# Patient Record
Sex: Female | Born: 1935 | Race: White | Hispanic: No | Smoking: Former smoker
Health system: Southern US, Community
[De-identification: ages and names within clinical notes are randomized; demographics above are authoritative.]

## PROBLEM LIST (undated history)

## (undated) DIAGNOSIS — R55 Syncope and collapse: Secondary | ICD-10-CM

## (undated) DIAGNOSIS — N3090 Cystitis, unspecified without hematuria: Secondary | ICD-10-CM

## (undated) DIAGNOSIS — K219 Gastro-esophageal reflux disease without esophagitis: Secondary | ICD-10-CM

## (undated) DIAGNOSIS — R920 Mammographic microcalcification found on diagnostic imaging of breast: Secondary | ICD-10-CM

## (undated) DIAGNOSIS — D241 Benign neoplasm of right breast: Secondary | ICD-10-CM

## (undated) DIAGNOSIS — K639 Disease of intestine, unspecified: Secondary | ICD-10-CM

## (undated) DIAGNOSIS — M1990 Unspecified osteoarthritis, unspecified site: Secondary | ICD-10-CM

## (undated) DIAGNOSIS — C801 Malignant (primary) neoplasm, unspecified: Secondary | ICD-10-CM

## (undated) DIAGNOSIS — Z974 Presence of external hearing-aid: Secondary | ICD-10-CM

## (undated) DIAGNOSIS — E785 Hyperlipidemia, unspecified: Secondary | ICD-10-CM

## (undated) DIAGNOSIS — T7840XA Allergy, unspecified, initial encounter: Secondary | ICD-10-CM

## (undated) DIAGNOSIS — C50419 Malignant neoplasm of upper-outer quadrant of unspecified female breast: Secondary | ICD-10-CM

## (undated) DIAGNOSIS — K589 Irritable bowel syndrome without diarrhea: Secondary | ICD-10-CM

## (undated) HISTORY — DX: Hyperlipidemia, unspecified: E78.5

## (undated) HISTORY — DX: Unspecified osteoarthritis, unspecified site: M19.90

## (undated) HISTORY — DX: Cystitis, unspecified without hematuria: N30.90

## (undated) HISTORY — DX: Malignant (primary) neoplasm, unspecified: C80.1

## (undated) HISTORY — DX: Irritable bowel syndrome, unspecified: K58.9

## (undated) HISTORY — DX: Mammographic microcalcification found on diagnostic imaging of breast: R92.0

## (undated) HISTORY — DX: Malignant neoplasm of upper-outer quadrant of unspecified female breast: C50.419

## (undated) HISTORY — DX: Presence of external hearing-aid: Z97.4

## (undated) HISTORY — DX: Benign neoplasm of right breast: D24.1

## (undated) HISTORY — DX: Gastro-esophageal reflux disease without esophagitis: K21.9

## (undated) HISTORY — DX: Disease of intestine, unspecified: K63.9

## (undated) HISTORY — DX: Syncope and collapse: R55

## (undated) HISTORY — DX: Allergy, unspecified, initial encounter: T78.40XA

---

## 1963-02-08 HISTORY — PX: OTHER SURGICAL HISTORY: SHX169

## 1965-02-07 HISTORY — PX: OVARIAN CYST REMOVAL: SHX89

## 1968-02-08 DIAGNOSIS — K639 Disease of intestine, unspecified: Secondary | ICD-10-CM

## 1968-02-08 HISTORY — DX: Disease of intestine, unspecified: K63.9

## 1978-02-07 DIAGNOSIS — M1990 Unspecified osteoarthritis, unspecified site: Secondary | ICD-10-CM

## 1978-02-07 HISTORY — DX: Unspecified osteoarthritis, unspecified site: M19.90

## 2004-02-08 HISTORY — PX: POLYPECTOMY: SHX149

## 2004-02-08 HISTORY — PX: THYROIDECTOMY: SHX17

## 2004-03-14 ENCOUNTER — Observation Stay (HOSPITAL_COMMUNITY): Admission: EM | Admit: 2004-03-14 | Discharge: 2004-03-15 | Payer: Self-pay | Admitting: Emergency Medicine

## 2004-05-25 ENCOUNTER — Ambulatory Visit: Payer: Self-pay | Admitting: General Surgery

## 2004-07-15 ENCOUNTER — Ambulatory Visit: Payer: Self-pay | Admitting: Family Medicine

## 2004-07-22 ENCOUNTER — Ambulatory Visit: Payer: Self-pay | Admitting: Family Medicine

## 2004-10-26 ENCOUNTER — Ambulatory Visit: Payer: Self-pay | Admitting: Otolaryngology

## 2005-01-14 ENCOUNTER — Ambulatory Visit: Payer: Self-pay | Admitting: Otolaryngology

## 2005-10-12 ENCOUNTER — Ambulatory Visit: Payer: Self-pay | Admitting: Family Medicine

## 2006-02-07 DIAGNOSIS — N3090 Cystitis, unspecified without hematuria: Secondary | ICD-10-CM

## 2006-02-07 HISTORY — DX: Cystitis, unspecified without hematuria: N30.90

## 2007-02-08 HISTORY — PX: OTHER SURGICAL HISTORY: SHX169

## 2007-02-08 HISTORY — PX: TONGUE SURGERY: SHX810

## 2007-09-24 ENCOUNTER — Ambulatory Visit: Payer: Self-pay

## 2007-09-24 ENCOUNTER — Other Ambulatory Visit: Payer: Self-pay

## 2008-04-30 ENCOUNTER — Ambulatory Visit: Payer: Self-pay | Admitting: Family Medicine

## 2009-02-07 HISTORY — PX: COLONOSCOPY: SHX174

## 2009-06-09 ENCOUNTER — Ambulatory Visit: Payer: Self-pay | Admitting: General Surgery

## 2009-06-18 ENCOUNTER — Ambulatory Visit: Payer: Self-pay | Admitting: Family Medicine

## 2009-06-24 ENCOUNTER — Ambulatory Visit: Payer: Self-pay | Admitting: Family Medicine

## 2010-09-22 ENCOUNTER — Ambulatory Visit: Payer: Self-pay | Admitting: Family Medicine

## 2010-10-14 ENCOUNTER — Ambulatory Visit: Payer: Self-pay | Admitting: Family Medicine

## 2010-11-08 HISTORY — PX: BREAST SURGERY: SHX581

## 2010-11-21 DIAGNOSIS — R928 Other abnormal and inconclusive findings on diagnostic imaging of breast: Secondary | ICD-10-CM | POA: Insufficient documentation

## 2010-11-22 ENCOUNTER — Ambulatory Visit: Payer: Self-pay | Admitting: General Surgery

## 2010-11-24 LAB — PATHOLOGY REPORT

## 2011-04-07 DIAGNOSIS — Z961 Presence of intraocular lens: Secondary | ICD-10-CM | POA: Diagnosis not present

## 2011-04-07 DIAGNOSIS — H2510 Age-related nuclear cataract, unspecified eye: Secondary | ICD-10-CM | POA: Diagnosis not present

## 2011-04-29 DIAGNOSIS — E039 Hypothyroidism, unspecified: Secondary | ICD-10-CM | POA: Diagnosis not present

## 2011-05-05 DIAGNOSIS — H6120 Impacted cerumen, unspecified ear: Secondary | ICD-10-CM | POA: Diagnosis not present

## 2011-05-05 DIAGNOSIS — H903 Sensorineural hearing loss, bilateral: Secondary | ICD-10-CM | POA: Diagnosis not present

## 2011-05-05 DIAGNOSIS — E890 Postprocedural hypothyroidism: Secondary | ICD-10-CM | POA: Diagnosis not present

## 2011-05-26 ENCOUNTER — Ambulatory Visit: Payer: Self-pay | Admitting: General Surgery

## 2011-05-26 DIAGNOSIS — R928 Other abnormal and inconclusive findings on diagnostic imaging of breast: Secondary | ICD-10-CM | POA: Diagnosis not present

## 2011-05-26 DIAGNOSIS — R920 Mammographic microcalcification found on diagnostic imaging of breast: Secondary | ICD-10-CM | POA: Diagnosis not present

## 2011-06-06 DIAGNOSIS — R920 Mammographic microcalcification found on diagnostic imaging of breast: Secondary | ICD-10-CM | POA: Diagnosis not present

## 2011-07-07 ENCOUNTER — Ambulatory Visit: Payer: Self-pay | Admitting: Gastroenterology

## 2011-07-07 DIAGNOSIS — Z79899 Other long term (current) drug therapy: Secondary | ICD-10-CM | POA: Diagnosis not present

## 2011-07-07 DIAGNOSIS — E890 Postprocedural hypothyroidism: Secondary | ICD-10-CM | POA: Diagnosis not present

## 2011-07-07 DIAGNOSIS — T18108A Unspecified foreign body in esophagus causing other injury, initial encounter: Secondary | ICD-10-CM | POA: Diagnosis not present

## 2011-07-07 DIAGNOSIS — R1310 Dysphagia, unspecified: Secondary | ICD-10-CM | POA: Diagnosis not present

## 2011-07-07 DIAGNOSIS — K2100 Gastro-esophageal reflux disease with esophagitis, without bleeding: Secondary | ICD-10-CM | POA: Diagnosis not present

## 2011-07-07 DIAGNOSIS — R6889 Other general symptoms and signs: Secondary | ICD-10-CM | POA: Diagnosis not present

## 2011-07-07 LAB — COMPREHENSIVE METABOLIC PANEL
Anion Gap: 6 — ABNORMAL LOW (ref 7–16)
BUN: 16 mg/dL (ref 7–18)
Chloride: 107 mmol/L (ref 98–107)
Co2: 29 mmol/L (ref 21–32)
Creatinine: 0.95 mg/dL (ref 0.60–1.30)
EGFR (African American): >60
EGFR (Non-African Amer.): 59 — ABNORMAL LOW
Glucose: 104 mg/dL — ABNORMAL HIGH (ref 65–99)
Potassium: 3.7 mmol/L (ref 3.5–5.1)
SGOT(AST): 21 U/L (ref 15–37)
Total Protein: 8.3 g/dL — ABNORMAL HIGH (ref 6.4–8.2)

## 2011-07-07 LAB — CBC
HCT: 44.1 % (ref 35.0–47.0)
HGB: 14.4 g/dL (ref 12.0–16.0)
MCHC: 32.5 g/dL (ref 32.0–36.0)
MCV: 93 fL (ref 80–100)
Platelet: 304 10*3/uL (ref 150–440)
RBC: 4.76 10*6/uL (ref 3.80–5.20)

## 2011-07-08 DIAGNOSIS — T18108A Unspecified foreign body in esophagus causing other injury, initial encounter: Secondary | ICD-10-CM | POA: Diagnosis not present

## 2011-07-08 DIAGNOSIS — R1310 Dysphagia, unspecified: Secondary | ICD-10-CM | POA: Diagnosis not present

## 2011-07-08 DIAGNOSIS — K2100 Gastro-esophageal reflux disease with esophagitis, without bleeding: Secondary | ICD-10-CM | POA: Diagnosis not present

## 2011-08-02 DIAGNOSIS — E039 Hypothyroidism, unspecified: Secondary | ICD-10-CM | POA: Diagnosis not present

## 2011-08-05 DIAGNOSIS — H903 Sensorineural hearing loss, bilateral: Secondary | ICD-10-CM | POA: Diagnosis not present

## 2011-08-05 DIAGNOSIS — E890 Postprocedural hypothyroidism: Secondary | ICD-10-CM | POA: Diagnosis not present

## 2011-08-05 DIAGNOSIS — H6120 Impacted cerumen, unspecified ear: Secondary | ICD-10-CM | POA: Diagnosis not present

## 2011-11-23 DIAGNOSIS — Z23 Encounter for immunization: Secondary | ICD-10-CM | POA: Diagnosis not present

## 2011-11-28 ENCOUNTER — Ambulatory Visit: Payer: Self-pay | Admitting: General Surgery

## 2011-11-28 DIAGNOSIS — R928 Other abnormal and inconclusive findings on diagnostic imaging of breast: Secondary | ICD-10-CM | POA: Diagnosis not present

## 2011-12-01 ENCOUNTER — Ambulatory Visit: Payer: Self-pay | Admitting: General Surgery

## 2011-12-01 DIAGNOSIS — R928 Other abnormal and inconclusive findings on diagnostic imaging of breast: Secondary | ICD-10-CM | POA: Diagnosis not present

## 2011-12-06 DIAGNOSIS — R920 Mammographic microcalcification found on diagnostic imaging of breast: Secondary | ICD-10-CM | POA: Diagnosis not present

## 2012-02-08 DIAGNOSIS — C801 Malignant (primary) neoplasm, unspecified: Secondary | ICD-10-CM

## 2012-02-08 HISTORY — PX: BREAST LUMPECTOMY: SHX2

## 2012-02-08 HISTORY — PX: BREAST EXCISIONAL BIOPSY: SUR124

## 2012-02-08 HISTORY — PX: BREAST BIOPSY: SHX20

## 2012-02-08 HISTORY — DX: Malignant (primary) neoplasm, unspecified: C80.1

## 2012-04-07 ENCOUNTER — Encounter: Payer: Self-pay | Admitting: *Deleted

## 2012-05-24 ENCOUNTER — Ambulatory Visit: Payer: Self-pay | Admitting: Otolaryngology

## 2012-05-24 DIAGNOSIS — H6120 Impacted cerumen, unspecified ear: Secondary | ICD-10-CM | POA: Diagnosis not present

## 2012-05-24 DIAGNOSIS — E039 Hypothyroidism, unspecified: Secondary | ICD-10-CM | POA: Diagnosis not present

## 2012-05-24 DIAGNOSIS — H903 Sensorineural hearing loss, bilateral: Secondary | ICD-10-CM | POA: Diagnosis not present

## 2012-05-24 DIAGNOSIS — E890 Postprocedural hypothyroidism: Secondary | ICD-10-CM | POA: Diagnosis not present

## 2012-05-24 LAB — TSH: Thyroid Stimulating Horm: 0.987 u[IU]/mL

## 2012-05-25 DIAGNOSIS — M7150 Other bursitis, not elsewhere classified, unspecified site: Secondary | ICD-10-CM | POA: Diagnosis not present

## 2012-05-25 DIAGNOSIS — M25569 Pain in unspecified knee: Secondary | ICD-10-CM | POA: Diagnosis not present

## 2012-06-04 ENCOUNTER — Ambulatory Visit: Payer: Self-pay | Admitting: General Surgery

## 2012-06-04 DIAGNOSIS — R928 Other abnormal and inconclusive findings on diagnostic imaging of breast: Secondary | ICD-10-CM | POA: Diagnosis not present

## 2012-06-04 DIAGNOSIS — R920 Mammographic microcalcification found on diagnostic imaging of breast: Secondary | ICD-10-CM | POA: Diagnosis not present

## 2012-06-06 ENCOUNTER — Encounter: Payer: Self-pay | Admitting: General Surgery

## 2012-06-18 DIAGNOSIS — M25569 Pain in unspecified knee: Secondary | ICD-10-CM | POA: Diagnosis not present

## 2012-06-18 DIAGNOSIS — J0100 Acute maxillary sinusitis, unspecified: Secondary | ICD-10-CM | POA: Diagnosis not present

## 2012-06-19 ENCOUNTER — Other Ambulatory Visit: Payer: Self-pay | Admitting: *Deleted

## 2012-06-19 ENCOUNTER — Ambulatory Visit (INDEPENDENT_AMBULATORY_CARE_PROVIDER_SITE_OTHER): Payer: Medicare Other | Admitting: General Surgery

## 2012-06-19 ENCOUNTER — Encounter: Payer: Self-pay | Admitting: General Surgery

## 2012-06-19 VITALS — BP 152/80 | HR 72 | Resp 12 | Ht 68.0 in | Wt 202.0 lb

## 2012-06-19 DIAGNOSIS — N63 Unspecified lump in unspecified breast: Secondary | ICD-10-CM | POA: Diagnosis not present

## 2012-06-19 DIAGNOSIS — R928 Other abnormal and inconclusive findings on diagnostic imaging of breast: Secondary | ICD-10-CM

## 2012-06-19 NOTE — Progress Notes (Signed)
Patient to have a bilateral diagnostic mammogram follow up in October 2014.  

## 2012-06-19 NOTE — Progress Notes (Addendum)
Patient ID: Eileen Flores, female   DOB: 01-12-1936, 77 y.o.   MRN: [id number]  Chief Complaint  Patient presents with  . Follow-up    HPI Eileen Flores is a 77 y.o. female.  Patient here today for 6 month follow up right mammogram, area of density.  She has known history of left breast stereo biopsy benign in Oct 2012.  She denies any breast issues, and no family history of breast cancer.  She does states she has a little "sinus congestion" and is on doxycylcine. HPI  Past Medical History  Diagnosis Date  . Cystitis 2008  . Arthritis 1980  . Mammographic microcalcification   . Bowel trouble 1970    Past Surgical History  Procedure Laterality Date  . Ovarian cyst removal  1967  . Dermbrasion   1965  . Colonoscopy  2011    Dr. Lemar Livings  . Cataract surgery Bilateral 2009  . Polypectomy  2006  . Thyroidectomy  2006  . Tongue surgery  2009  . Breast surgery Left Oct 2012    benign stereo biopsy    Family History  Problem Relation Age of Onset  . Colon cancer    . Colon polyps      Social History History  Substance Use Topics  . Smoking status: Never Smoker   . Smokeless tobacco: Never Used  . Alcohol Use: No    Allergies  Allergen Reactions  . Penicillins Rash    Current Outpatient Prescriptions  Medication Sig Dispense Refill  . Calcium Carb-Cholecalciferol (CALCIUM + D3) 600-200 MG-UNIT TABS Take 1 tablet by mouth daily.      . calcium citrate-vitamin D (CITRACAL+D) 315-200 MG-UNIT per tablet Take 1 tablet by mouth 2 (two) times daily.      [name] doxycycline (DORYX) 100 MG DR capsule Take 100 mg by mouth 2 (two) times daily.      [name] levothyroxine (SYNTHROID, LEVOTHROID) 100 MCG tablet Take 100 mcg by mouth daily before breakfast.      . meloxicam (MOBIC) 7.5 MG tablet Take 7.5 mg by mouth daily.      . Multiple Vitamins-Minerals (MULTIVITAMIN WITH MINERALS) tablet Take 1 tablet by mouth daily.      . polycarbophil (FIBERCON) 625 MG tablet Take 625 mg by mouth as needed.        No current facility-administered medications for this visit.    Review of Systems Review of Systems  Constitutional: Negative.   Respiratory: Negative.   Cardiovascular: Negative.     Blood pressure 152/80, pulse 72, resp. rate 12, height 5\' 8"  (1.727 m), weight 202 lb (91.627 kg).  Physical Exam Physical Exam  Constitutional: She is oriented to person, place, and time. She appears well-developed and well-nourished.  Cardiovascular: Normal rate.   Murmur heard.  Systolic murmur is present with a grade of 2/6  Pulmonary/Chest: Right breast exhibits no inverted nipple, no mass, no nipple discharge, no skin change and no tenderness. Left breast exhibits no inverted nipple, no mass, no nipple discharge, no skin change and no tenderness.  Lymphadenopathy:    She has no cervical adenopathy.    She has no axillary adenopathy.  Neurological: She is alert and oriented to person, place, and time.  Skin: Skin is warm and dry.    Data Reviewed Right breast mammogram completed April 2014 showed a stable nodular density. BI-RAD-3  Assessment    Benign breast exam. Stable mammogram. Modest grade 2/6 heart murmur operate sternal border. No associated shortness of breath or chest  pain.  Decreased exercise effort secondary to painful toes. Podiatry evaluation recommended.    Plan    Arrangements were made for a follow up mammogram and office visit in 6 months. A bilateral study will be obtained. If no interval changes noted after 2 years, she'll be released annual mammograms with her primary care physician.       Eileen Flores 06/20/2012, 7:44 PM

## 2012-06-19 NOTE — Patient Instructions (Addendum)
Continue self breast exams. Call office for any new breast issues or concerns.  Consider surgery on the toe Dr Orland Jarred or Dr Ether Griffins

## 2012-06-20 ENCOUNTER — Encounter: Payer: Self-pay | Admitting: General Surgery

## 2012-08-17 ENCOUNTER — Encounter: Payer: Self-pay | Admitting: *Deleted

## 2012-11-22 DIAGNOSIS — Z23 Encounter for immunization: Secondary | ICD-10-CM | POA: Diagnosis not present

## 2012-11-29 ENCOUNTER — Encounter: Payer: Self-pay | Admitting: General Surgery

## 2012-11-29 ENCOUNTER — Ambulatory Visit: Payer: Self-pay | Admitting: General Surgery

## 2012-11-29 DIAGNOSIS — N63 Unspecified lump in unspecified breast: Secondary | ICD-10-CM | POA: Diagnosis not present

## 2012-11-29 DIAGNOSIS — R928 Other abnormal and inconclusive findings on diagnostic imaging of breast: Secondary | ICD-10-CM | POA: Diagnosis not present

## 2012-12-06 ENCOUNTER — Ambulatory Visit (INDEPENDENT_AMBULATORY_CARE_PROVIDER_SITE_OTHER): Payer: Medicare Other | Admitting: General Surgery

## 2012-12-06 ENCOUNTER — Encounter: Payer: Self-pay | Admitting: General Surgery

## 2012-12-06 VITALS — BP 140/80 | HR 72 | Resp 14 | Ht 68.0 in | Wt 203.0 lb

## 2012-12-06 DIAGNOSIS — N63 Unspecified lump in unspecified breast: Secondary | ICD-10-CM

## 2012-12-06 DIAGNOSIS — R928 Other abnormal and inconclusive findings on diagnostic imaging of breast: Secondary | ICD-10-CM

## 2012-12-06 DIAGNOSIS — D249 Benign neoplasm of unspecified breast: Secondary | ICD-10-CM | POA: Diagnosis not present

## 2012-12-06 NOTE — Patient Instructions (Signed)

## 2012-12-06 NOTE — Progress Notes (Signed)
Patient ID: Eileen Flores, female   DOB: 10/23/1935, 77 y.o.   MRN: [id number]  Chief Complaint  Patient presents with  . Other    mammogram    HPI Eileen Flores is a 77 y.o. female who presents for a breast evaluation. The most recent mammogram was done on 11/29/12 cat 4. Patient does perform regular self breast checks and gets regular mammograms done.  The patient was originally identified with a questionable mammographic abnormality last year. No interval change at her spring 2014 exam. Recent studies suggest progression. The patient is unaware of any palpable abnormality.  HPI  Past Medical History  Diagnosis Date  . Cystitis 2008  . Arthritis 1980  . Mammographic microcalcification   . Bowel trouble 1970  . Allergy   . IBS (irritable bowel syndrome)     since 1970's    Past Surgical History  Procedure Laterality Date  . Ovarian cyst removal  1967  . Dermbrasion   1965  . Colonoscopy  2011    Dr. Lemar Livings  . Cataract surgery Bilateral 2009  . Polypectomy  2006  . Thyroidectomy  2006  . Tongue surgery  2009  . Breast surgery Left Oct 2012    benign stereo biopsy    Family History  Problem Relation Age of Onset  . Colon cancer    . Colon polyps    . Cancer Mother 3    liver cancer  . Cancer Father 74    liver cancer  . Alcohol abuse Father     Social History History  Substance Use Topics  . Smoking status: Never Smoker   . Smokeless tobacco: Never Used  . Alcohol Use: No    Allergies  Allergen Reactions  . Penicillins Rash    Current Outpatient Prescriptions  Medication Sig Dispense Refill  . Calcium Carb-Cholecalciferol (CALCIUM + D3) 600-200 MG-UNIT TABS Take 1 tablet by mouth daily.      . calcium citrate-vitamin D (CITRACAL+D) 315-200 MG-UNIT per tablet Take 1 tablet by mouth 2 (two) times daily.      [name] levothyroxine (SYNTHROID, LEVOTHROID) 100 MCG tablet Take 100 mcg by mouth daily before breakfast.      . meloxicam (MOBIC) 7.5 MG tablet Take 7.5  mg by mouth daily.      . Multiple Vitamins-Minerals (MULTIVITAMIN WITH MINERALS) tablet Take 1 tablet by mouth daily.      . polycarbophil (FIBERCON) 625 MG tablet Take 625 mg by mouth as needed.       No current facility-administered medications for this visit.    Review of Systems Review of Systems  Constitutional: Negative.   Respiratory: Negative.   Cardiovascular: Negative.     Blood pressure 140/80, pulse 72, resp. rate 14, height 5\' 8"  (1.727 m), weight 203 lb (92.08 kg).  Physical Exam Physical Exam  Constitutional: She is oriented to person, place, and time. She appears well-developed and well-nourished.  Eyes: Conjunctivae are normal. No scleral icterus.  Cardiovascular: Normal rate, regular rhythm and normal heart sounds.   Pulmonary/Chest: Breath sounds normal. Right breast exhibits no inverted nipple, no mass, no nipple discharge, no skin change and no tenderness. Left breast exhibits no inverted nipple, no mass, no nipple discharge, no skin change and no tenderness.  Abdominal: Bowel sounds are normal.  Lymphadenopathy:    She has no cervical adenopathy.    She has no axillary adenopathy.  Neurological: She is alert and oriented to person, place, and time.  Skin: Skin is warm and dry.  Data Reviewed Mammogram dated 11/29/2012 suggested a 1 cm mass in the 10:00 position of the right breast. Obscured mass in the 11:00 position of the left breast. New calcifications within 2.5 cm of a previously placed stereotactic marker. Punctate calcifications measure 7 mm in diameter. Reported anterior and lateral to the urine mass separated by 3 cm.  Ultrasound at the 10:00 position of the right breast showed a 7 x 8 x 12 mm lesion. Normal axillary nodes. Left breast ultrasound showed a well-circumscribed hypoechoic mass measuring 6 x 5 x 5 mm. BI-RAD-4.  Review of the 11/22/2010 biopsy results completed for microcalcifications showed a benign sclerotic fibroadenomatoid changes  with core stromal calcifications. Columnar cell hyperplasia without atypical hyperplasia.  Ultrasound examination of the left breast was special attention of the upper inner quadrant at the 11:00 position 8 cm from the nipple showed a well-circumscribed hypoechoic nodule measuring 0.46 x 0.5 x 0.6 cm. Some acoustic enhancement was noted. The patient was amenable to FNA sampling. This was completed using 1 cc of 1% plain Xylocaine. For slides were prepared for cytology.  Ultrasound examination of the right breast at the 10:00 position, 5 cm from the nipple showed a hypoechoic mass measuring 0.5 x 0.8 x 1.0 cm. This corresponded to the [facility] study. The patient was amenable to core biopsy. 10 cc of 0.5% Xylocaine with 0.25% Marcaine with 1-200,000 units of epinephrine was utilized well tolerated. Under ultrasound guidance a 14-gauge Finesse biopsy device was passed through multiple areas of the lesion in a core samples obtained. A postbiopsy clip was obtained. A small hematoma resolved with direct pressure from the ultrasound transducer. The skin defect was closed with benzoin and Steri-Strips followed by Telfa and Tegaderm dressing. The procedure was well tolerated.  Assessment    Interval change in left breast mammogram.  Right breast mass evident on mammogram and ultrasound, progressive since 2012.      Plan    The patient will be contacted when the cytology and pathology results are available.  Based on the every so slightly aggression of calcifications in the left breast from 2013-2014, once the right breast pathology is resolved she would be a candidate for repeat stereotactic biopsy of the left breast.       [name] 12/08/2012, 9:49 AM

## 2012-12-07 ENCOUNTER — Telehealth: Payer: Self-pay | Admitting: General Surgery

## 2012-12-07 NOTE — Telephone Encounter (Signed)
Notified right breast biopsy benign. She reports she tolerated the biopsy well. FNA from left breast due next week.

## 2012-12-08 LAB — PATHOLOGY

## 2012-12-10 ENCOUNTER — Telehealth: Payer: Self-pay | Admitting: General Surgery

## 2012-12-10 ENCOUNTER — Telehealth: Payer: Self-pay | Admitting: *Deleted

## 2012-12-10 DIAGNOSIS — R920 Mammographic microcalcification found on diagnostic imaging of breast: Secondary | ICD-10-CM

## 2012-12-10 LAB — FINE-NEEDLE ASPIRATION

## 2012-12-10 NOTE — Telephone Encounter (Signed)
Message copied by [name] on Mon Dec 10, 2012  8:55 AM ------      Message from: Eileen Flores      Created: Sat Dec 08, 2012 10:03 AM       Patient needs a left stereo biopsy. Remind me to contact patient on Nov 3 to discuss w/ her. Reserve time for next date. Thanks. ------

## 2012-12-10 NOTE — Telephone Encounter (Signed)
Patient has been scheduled for a left breast stereotactic biopsy at [facility] for 12-17-12 at 4 pm. She will check-in at the [facility] at 3:30 pm. This patient is aware of date, time, and instructions. Patient verbalizes understanding.

## 2012-12-10 NOTE — Telephone Encounter (Signed)
Reviewed mammograms over the weekend. Left microcalcifications noted by the radiologist are indeed more prominent than last year. Previous left biopsy in 2012 was benign. Recommended repeat left breast stereo biopsy.  Patient is amendable. She reports no difficulty from the right biopsy completed last week.

## 2012-12-12 ENCOUNTER — Telehealth: Payer: Self-pay | Admitting: General Surgery

## 2012-12-12 NOTE — Telephone Encounter (Signed)
Message left that cytology results were in. Asked her to call back. Will review at time of planned left stereo biopsy if not earlier.

## 2012-12-13 ENCOUNTER — Ambulatory Visit: Payer: Medicare Other

## 2012-12-17 ENCOUNTER — Ambulatory Visit: Payer: Self-pay | Admitting: General Surgery

## 2012-12-17 ENCOUNTER — Encounter: Payer: Self-pay | Admitting: General Surgery

## 2012-12-17 DIAGNOSIS — N63 Unspecified lump in unspecified breast: Secondary | ICD-10-CM

## 2012-12-17 DIAGNOSIS — R920 Mammographic microcalcification found on diagnostic imaging of breast: Secondary | ICD-10-CM

## 2012-12-17 DIAGNOSIS — D0590 Unspecified type of carcinoma in situ of unspecified breast: Secondary | ICD-10-CM | POA: Diagnosis not present

## 2012-12-17 DIAGNOSIS — C50919 Malignant neoplasm of unspecified site of unspecified female breast: Secondary | ICD-10-CM | POA: Diagnosis not present

## 2012-12-17 LAB — ER/PR,IMMUNOHISTOCHEM,PARAFFIN: Estrogen Receptor IHC: >90

## 2012-12-18 ENCOUNTER — Telehealth: Payer: Self-pay | Admitting: General Surgery

## 2012-12-18 ENCOUNTER — Encounter: Payer: Self-pay | Admitting: General Surgery

## 2012-12-18 NOTE — Telephone Encounter (Signed)
Patient notified both sites biopsied yesterday showed cancer cells in the milk duct. (insitu). Favorable prognosis. Agreeable to come in tomorrow afternoon to discuss options for management.

## 2012-12-19 ENCOUNTER — Encounter: Payer: Self-pay | Admitting: General Surgery

## 2012-12-19 ENCOUNTER — Ambulatory Visit (INDEPENDENT_AMBULATORY_CARE_PROVIDER_SITE_OTHER): Payer: Medicare Other | Admitting: General Surgery

## 2012-12-19 VITALS — BP 152/76 | HR 84 | Resp 16 | Ht 68.0 in | Wt 201.0 lb

## 2012-12-19 DIAGNOSIS — D0512 Intraductal carcinoma in situ of left breast: Secondary | ICD-10-CM

## 2012-12-19 DIAGNOSIS — D0590 Unspecified type of carcinoma in situ of unspecified breast: Secondary | ICD-10-CM | POA: Diagnosis not present

## 2012-12-19 DIAGNOSIS — D0510 Intraductal carcinoma in situ of unspecified breast: Secondary | ICD-10-CM | POA: Insufficient documentation

## 2012-12-19 NOTE — Progress Notes (Signed)
Patient ID: Eileen Flores, female   DOB: 07/03/1935, 77 y.o.   MRN: [id number]  Chief Complaint  Patient presents with  . Other    breast discussion    HPI Eileen Flores is a 77 y.o. female who presents today for a breast discussion. The patient denies any new complaints at this time.   HPI  Past Medical History  Diagnosis Date  . Cystitis 2008  . Arthritis 1980  . Mammographic microcalcification   . Bowel trouble 1970  . Allergy   . IBS (irritable bowel syndrome)     since 1970's  . Cancer 2014    left breast DCIS    Past Surgical History  Procedure Laterality Date  . Ovarian cyst removal  1967  . Dermbrasion   1965  . Colonoscopy  2011    Dr. Lemar Livings  . Cataract surgery Bilateral 2009  . Polypectomy  2006  . Thyroidectomy  2006  . Tongue surgery  2009  . Breast surgery Left Oct 2012    benign stereo biopsy  . Breast biopsy Left 2014    stereo biopsy    Family History  Problem Relation Age of Onset  . Colon cancer    . Colon polyps    . Cancer Mother 43    liver cancer  . Cancer Father 74    liver cancer  . Alcohol abuse Father     Social History History  Substance Use Topics  . Smoking status: Never Smoker   . Smokeless tobacco: Never Used  . Alcohol Use: No    Allergies  Allergen Reactions  . Penicillins Rash    Current Outpatient Prescriptions  Medication Sig Dispense Refill  . Calcium Carb-Cholecalciferol (CALCIUM + D3) 600-200 MG-UNIT TABS Take 1 tablet by mouth daily.      . calcium citrate-vitamin D (CITRACAL+D) 315-200 MG-UNIT per tablet Take 1 tablet by mouth 2 (two) times daily.      [name] levothyroxine (SYNTHROID, LEVOTHROID) 100 MCG tablet Take 100 mcg by mouth daily before breakfast.      . meloxicam (MOBIC) 7.5 MG tablet Take 7.5 mg by mouth daily.      . Multiple Vitamins-Minerals (MULTIVITAMIN WITH MINERALS) tablet Take 1 tablet by mouth daily.      . polycarbophil (FIBERCON) 625 MG tablet Take 625 mg by mouth as needed.       No  current facility-administered medications for this visit.    Review of Systems Review of Systems  Blood pressure 152/76, pulse 84, resp. rate 16, height 5\' 8"  (1.727 m), weight 201 lb (91.173 kg).  Physical Exam Physical Exam The right breast shows moderate ecchymosis from her previous biopsy. The left breast shows no similar process.  Data Reviewed The posterior nodular lesion showed a intraductal papillary carcinoma measuring 0.5 cm in diameter. Definite invasion was not appreciated.  The superficial biopsy for microcalcifications showed DCIS, nuclear grade 2 involving a complex sclerosing lesion.  Ultrasound examination of the left breast shows both biopsy sites visible. These are within 3 cm of each other.  RIGHT BREAST 10:00, BIOPSY: - FRAGMENTS OF INTRADUCTAL PAPILLOMA WITH SCLEROSIS AND APOCRINE METAPLASIA. - NEGATIVE FOR ATYPIA AND MALIGNANCY.     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  Papilloma of the right breast.    Plan    Management of DCIS/papillary carcinoma in situ was reviewed. Wide excision followed by postoperative radiotherapy was discussed. Mastectomy could be considered for a patient who is not interested in  radiation therapy.  As the patient will require general anesthesia, I would recommend complete excision of the right breast papilloma at the same time.  Opportunity for second surgical opinion was reviewed. An informational brochure was provided. The patient will consider her options notify the office of how she would like to proceed.       Eileen Flores 12/19/2012, 3:20 PM

## 2012-12-24 ENCOUNTER — Ambulatory Visit: Payer: Medicare Other

## 2012-12-24 ENCOUNTER — Encounter: Payer: Self-pay | Admitting: General Surgery

## 2012-12-26 ENCOUNTER — Telehealth: Payer: Self-pay | Admitting: *Deleted

## 2012-12-26 NOTE — Telephone Encounter (Signed)
Patient's surgery has been scheduled for 01-14-13 at [facility]. She will come to the office for a brief office visit for ultrasound (no charge) to confirm location. Paperwork has been mailed to the patient. She was instructed to call the office if she has furhter questions.

## 2013-01-07 ENCOUNTER — Other Ambulatory Visit: Payer: Medicare Other

## 2013-01-07 ENCOUNTER — Other Ambulatory Visit: Payer: Self-pay | Admitting: General Surgery

## 2013-01-07 ENCOUNTER — Ambulatory Visit: Payer: Self-pay | Admitting: General Surgery

## 2013-01-07 ENCOUNTER — Encounter: Payer: Self-pay | Admitting: General Surgery

## 2013-01-07 ENCOUNTER — Ambulatory Visit (INDEPENDENT_AMBULATORY_CARE_PROVIDER_SITE_OTHER): Payer: Medicare Other | Admitting: General Surgery

## 2013-01-07 VITALS — BP 120/80 | HR 64 | Resp 12 | Ht 69.0 in | Wt 201.0 lb

## 2013-01-07 DIAGNOSIS — C50912 Malignant neoplasm of unspecified site of left female breast: Secondary | ICD-10-CM

## 2013-01-07 DIAGNOSIS — C50419 Malignant neoplasm of upper-outer quadrant of unspecified female breast: Secondary | ICD-10-CM

## 2013-01-07 DIAGNOSIS — Z01810 Encounter for preprocedural cardiovascular examination: Secondary | ICD-10-CM | POA: Diagnosis not present

## 2013-01-07 DIAGNOSIS — D241 Benign neoplasm of right breast: Secondary | ICD-10-CM

## 2013-01-07 DIAGNOSIS — N63 Unspecified lump in unspecified breast: Secondary | ICD-10-CM

## 2013-01-07 DIAGNOSIS — D0512 Intraductal carcinoma in situ of left breast: Secondary | ICD-10-CM | POA: Insufficient documentation

## 2013-01-07 DIAGNOSIS — C50919 Malignant neoplasm of unspecified site of unspecified female breast: Secondary | ICD-10-CM

## 2013-01-07 DIAGNOSIS — Z01812 Encounter for preprocedural laboratory examination: Secondary | ICD-10-CM | POA: Diagnosis not present

## 2013-01-07 DIAGNOSIS — R002 Palpitations: Secondary | ICD-10-CM | POA: Diagnosis not present

## 2013-01-07 HISTORY — DX: Benign neoplasm of right breast: D24.1

## 2013-01-07 HISTORY — DX: Malignant neoplasm of upper-outer quadrant of unspecified female breast: C50.419

## 2013-01-07 NOTE — Patient Instructions (Addendum)
Patient is scheduled to have left and right breast surgery on 01/14/13. Patient to follow up after surgery is complete.

## 2013-01-07 NOTE — Progress Notes (Signed)
Patient ID: Eileen Flores, female   DOB: 1935-02-12, 78 y.o.   MRN: [id number]  Chief Complaint  Patient presents with  . Follow-up    breast ultrasound Pre-op breast cancer surgery scheduled for 01/14/13    HPI Eileen Flores is a 77 y.o. female who presents for a left breast ultrasound. She also is here today for a pre-op for left breast cancer surgery which is scheduled for 01/14/13. She denies any new complaints at this time.   HPI  Past Medical History  Diagnosis Date  . Cystitis 2008  . Arthritis 1980  . Mammographic microcalcification   . Bowel trouble 1970  . Allergy   . IBS (irritable bowel syndrome)     since 1970's  . Cancer 2014    left breast DCIS    Past Surgical History  Procedure Laterality Date  . Ovarian cyst removal  1967  . Dermbrasion   1965  . Colonoscopy  2011    Dr. Lemar Livings  . Cataract surgery Bilateral 2009  . Polypectomy  2006  . Thyroidectomy  2006  . Tongue surgery  2009  . Breast surgery Left Oct 2012    benign stereo biopsy  . Breast biopsy Left 2014    stereo biopsy    Family History  Problem Relation Age of Onset  . Colon cancer    . Colon polyps    . Cancer Mother 17    liver cancer  . Cancer Father 66    liver cancer  . Alcohol abuse Father     Social History History  Substance Use Topics  . Smoking status: Never Smoker   . Smokeless tobacco: Never Used  . Alcohol Use: No    Allergies  Allergen Reactions  . Penicillins Rash    Current Outpatient Prescriptions  Medication Sig Dispense Refill  . levothyroxine (SYNTHROID, LEVOTHROID) 100 MCG tablet Take 100 mcg by mouth daily before breakfast.       No current facility-administered medications for this visit.    Review of Systems Review of Systems  Constitutional: Negative.   Respiratory: Negative.   Cardiovascular: Negative.     Blood pressure 120/80, pulse 64, resp. rate 12, height 5\' 9"  (1.753 m), weight 201 lb (91.173 kg).  Physical Exam Physical Exam   Constitutional: She is oriented to person, place, and time. She appears well-developed and well-nourished.  Neck: Neck supple. No thyromegaly present.  Cardiovascular: Normal rate, regular rhythm and normal heart sounds.   No murmur heard. Pulmonary/Chest: Effort normal and breath sounds normal. Right breast exhibits no inverted nipple, no mass, no nipple discharge, no skin change and no tenderness. Left breast exhibits no inverted nipple, no mass, no nipple discharge, no skin change and no tenderness.  Right breast a little bit of thickening where biopsy was done.  Well healed scar in this area.   Left breast thickening in the upper-outer quadrant.   Lymphadenopathy:    She has no cervical adenopathy.    She has no axillary adenopathy.  Neurological: She is alert and oriented to person, place, and time.  Skin: Skin is warm and dry.    Data Reviewed Ultrasound examination showed the 2 previous stereotactic biopsy site 7. At the 11:00 position, 8 cm from the nipple a 0.4 x 0.6 cm area at the site of the original papilloma as well as a 0.6 x 0.7 x 0.8 cm well-defined cavity 6 cm from the nipple is identified.  Assessment    Papilloma right breast.  Papillary  in situ carcinoma left breast.    Plan    Plans for excision of the right breast lesion followed by wide excision of the left breast lesions was reviewed. Postoperative radiation therapy will be reviewed after the procedure and pathology reports are available. Arrangements will be made for a left breast mammogram prior to wide excision to be sure the spatial anatomy is fully investigated prior wide excision. I anticipate both areas in the left breast will be removed en block.         Eileen Flores 01/07/2013, 9:34 AM

## 2013-01-09 ENCOUNTER — Telehealth: Payer: Self-pay | Admitting: *Deleted

## 2013-01-09 NOTE — Telephone Encounter (Signed)
The patient has been scheduled for a unilateral left breast diagnostic mammogram at the [facility] for 01-10-13 at 2:20 pm. She is aware of date, time, and instructions.  Patient's surgery is presently scheduled for 01-14-13 at [facility].

## 2013-01-09 NOTE — Telephone Encounter (Signed)
Message copied by [name] on Wed Jan 09, 2013  9:34 AM ------      Message from: Carrizo Hill, Merrily Pew      Created: Mon Jan 07, 2013  7:22 PM       Please arrange for a left breast mammogram: CC and true lateral, post biopsy pre-wide excision. Let me know if you have any trouble with the mammogram staff. ------

## 2013-01-10 ENCOUNTER — Ambulatory Visit: Payer: Self-pay | Admitting: General Surgery

## 2013-01-10 DIAGNOSIS — C50919 Malignant neoplasm of unspecified site of unspecified female breast: Secondary | ICD-10-CM | POA: Diagnosis not present

## 2013-01-10 DIAGNOSIS — R928 Other abnormal and inconclusive findings on diagnostic imaging of breast: Secondary | ICD-10-CM | POA: Diagnosis not present

## 2013-01-11 ENCOUNTER — Encounter: Payer: Self-pay | Admitting: General Surgery

## 2013-01-11 DIAGNOSIS — H6120 Impacted cerumen, unspecified ear: Secondary | ICD-10-CM | POA: Diagnosis not present

## 2013-01-11 DIAGNOSIS — H903 Sensorineural hearing loss, bilateral: Secondary | ICD-10-CM | POA: Diagnosis not present

## 2013-01-11 DIAGNOSIS — E890 Postprocedural hypothyroidism: Secondary | ICD-10-CM | POA: Diagnosis not present

## 2013-01-14 ENCOUNTER — Ambulatory Visit: Payer: Self-pay | Admitting: General Surgery

## 2013-01-14 DIAGNOSIS — R42 Dizziness and giddiness: Secondary | ICD-10-CM | POA: Diagnosis not present

## 2013-01-14 DIAGNOSIS — Z880 Allergy status to penicillin: Secondary | ICD-10-CM | POA: Diagnosis not present

## 2013-01-14 DIAGNOSIS — K219 Gastro-esophageal reflux disease without esophagitis: Secondary | ICD-10-CM | POA: Diagnosis not present

## 2013-01-14 DIAGNOSIS — D0590 Unspecified type of carcinoma in situ of unspecified breast: Secondary | ICD-10-CM | POA: Diagnosis not present

## 2013-01-14 DIAGNOSIS — N63 Unspecified lump in unspecified breast: Secondary | ICD-10-CM

## 2013-01-14 DIAGNOSIS — D249 Benign neoplasm of unspecified breast: Secondary | ICD-10-CM | POA: Diagnosis not present

## 2013-01-14 DIAGNOSIS — C50919 Malignant neoplasm of unspecified site of unspecified female breast: Secondary | ICD-10-CM

## 2013-01-14 DIAGNOSIS — Z91018 Allergy to other foods: Secondary | ICD-10-CM | POA: Diagnosis not present

## 2013-01-14 DIAGNOSIS — Z23 Encounter for immunization: Secondary | ICD-10-CM | POA: Diagnosis not present

## 2013-01-14 DIAGNOSIS — M129 Arthropathy, unspecified: Secondary | ICD-10-CM | POA: Diagnosis not present

## 2013-01-14 DIAGNOSIS — E890 Postprocedural hypothyroidism: Secondary | ICD-10-CM | POA: Diagnosis not present

## 2013-01-14 DIAGNOSIS — Z79899 Other long term (current) drug therapy: Secondary | ICD-10-CM | POA: Diagnosis not present

## 2013-01-14 HISTORY — PX: BREAST SURGERY: SHX581

## 2013-01-15 ENCOUNTER — Encounter: Payer: Self-pay | Admitting: General Surgery

## 2013-01-15 DIAGNOSIS — M129 Arthropathy, unspecified: Secondary | ICD-10-CM | POA: Diagnosis not present

## 2013-01-15 DIAGNOSIS — D249 Benign neoplasm of unspecified breast: Secondary | ICD-10-CM | POA: Diagnosis not present

## 2013-01-15 DIAGNOSIS — R42 Dizziness and giddiness: Secondary | ICD-10-CM | POA: Diagnosis not present

## 2013-01-15 DIAGNOSIS — Z23 Encounter for immunization: Secondary | ICD-10-CM | POA: Diagnosis not present

## 2013-01-15 DIAGNOSIS — D0590 Unspecified type of carcinoma in situ of unspecified breast: Secondary | ICD-10-CM | POA: Diagnosis not present

## 2013-01-15 DIAGNOSIS — K219 Gastro-esophageal reflux disease without esophagitis: Secondary | ICD-10-CM | POA: Diagnosis not present

## 2013-01-17 ENCOUNTER — Ambulatory Visit (INDEPENDENT_AMBULATORY_CARE_PROVIDER_SITE_OTHER): Payer: Self-pay | Admitting: General Surgery

## 2013-01-17 ENCOUNTER — Ambulatory Visit: Payer: Medicare Other | Admitting: General Surgery

## 2013-01-17 ENCOUNTER — Encounter: Payer: Self-pay | Admitting: General Surgery

## 2013-01-17 VITALS — BP 172/90 | HR 78 | Resp 16 | Ht 69.0 in | Wt 202.0 lb

## 2013-01-17 DIAGNOSIS — D0510 Intraductal carcinoma in situ of unspecified breast: Secondary | ICD-10-CM | POA: Insufficient documentation

## 2013-01-17 DIAGNOSIS — D0512 Intraductal carcinoma in situ of left breast: Secondary | ICD-10-CM

## 2013-01-17 DIAGNOSIS — C50919 Malignant neoplasm of unspecified site of unspecified female breast: Secondary | ICD-10-CM

## 2013-01-17 DIAGNOSIS — N63 Unspecified lump in unspecified breast: Secondary | ICD-10-CM

## 2013-01-17 DIAGNOSIS — C50912 Malignant neoplasm of unspecified site of left female breast: Secondary | ICD-10-CM

## 2013-01-17 DIAGNOSIS — D249 Benign neoplasm of unspecified breast: Secondary | ICD-10-CM

## 2013-01-17 DIAGNOSIS — D0590 Unspecified type of carcinoma in situ of unspecified breast: Secondary | ICD-10-CM

## 2013-01-17 DIAGNOSIS — D241 Benign neoplasm of right breast: Secondary | ICD-10-CM

## 2013-01-17 NOTE — Progress Notes (Signed)
Patient ID: Eileen Flores, female   DOB: May 09, 1935, 77 y.o.   MRN: [id number]  Chief Complaint  Patient presents with  . Routine Post Op    HPI Eileen Flores is a 77 y.o. female.  Here today for her postoperative visit, excision right breast mass and excision left breast cancer on 01-14-13.  States she is doing well, minimal to no pain. HPI  Past Medical History  Diagnosis Date  . Cystitis 2008  . Arthritis 1980  . Mammographic microcalcification   . Bowel trouble 1970  . Allergy   . IBS (irritable bowel syndrome)     since 1970's  . 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 Ovarian cyst removal  1967  . Dermbrasion   1965  . Colonoscopy  2011    Dr. Lemar Livings  . Cataract surgery Bilateral 2009  . Polypectomy  2006  . Thyroidectomy  2006  . Tongue surgery  2009  . Breast surgery Left Oct 2012    benign stereo biopsy  . Breast biopsy Left 2014    stereo biopsy  . Breast surgery Right 01-14-13    excision breast mass  . Breast surgery Left 01-14-13    excision breast cancer    Family History  Problem Relation Age of Onset  . Colon cancer    . Colon polyps    . Cancer Mother 12    liver cancer  . Cancer Father 30    liver cancer  . Alcohol abuse Father     Social History History  Substance Use Topics  . Smoking status: Never Smoker   . Smokeless tobacco: Never Used  . Alcohol Use: No    Allergies  Allergen Reactions  . Penicillin G Rash  . Penicillins Rash    Current Outpatient Prescriptions  Medication Sig Dispense Refill  . levothyroxine (SYNTHROID, LEVOTHROID) 100 MCG tablet Take 100 mcg by mouth daily before breakfast.       No current facility-administered medications for this visit.    Review of Systems Review of Systems  Constitutional: Negative.   Respiratory: Negative.   Cardiovascular: Negative.     Blood pressure 172/90, pulse 78, resp. rate 16, height 5\' 9"  (1.753 m), weight 202  lb (91.627 kg).  Physical Exam Physical Exam  Constitutional: She is oriented to person, place, and time. She appears well-developed and well-nourished.  Pulmonary/Chest:  Mild volume loss left breast at the site of wide excision followed by mastoplasty. Right breast at 9 o'clock well healed incision Left upper breast 11 to 1 o'clock well healed incision  Neurological: She is alert and oriented to person, place, and time.  Skin: Skin is warm and dry.    Data Reviewed Pathology is pending. Limited ultrasound (no images, no charge) was completed to assess the left breast wide excision site. No clear-cut cavity evident on exam.  Assessment    Doing well status post wide excision of the foci of DCIS and intra-papillary carcinoma of the left breast, as well as the right breast papilloma.     Plan    The patient will be contacted with the pathology is available.      Patient has been scheduled for an appointment with Dr. Rushie Chestnut at the [facility] for 01-21-13 at 11 am. She is aware of date, time, and instructions.   The patient may well be a candidate for partial breast radiation based on the pathology  results and Dr. Kipp Laurence assessment.  [name] 01/17/2013, 8:57 PM

## 2013-01-17 NOTE — Patient Instructions (Addendum)
Continue self breast exams. Call office for any new breast issues or concerns. Appointment with Dr Rushie Chestnut office.

## 2013-01-21 ENCOUNTER — Ambulatory Visit: Payer: Self-pay | Admitting: Radiation Oncology

## 2013-01-21 ENCOUNTER — Telehealth: Payer: Self-pay | Admitting: General Surgery

## 2013-01-21 DIAGNOSIS — D0000 Carcinoma in situ of oral cavity, unspecified site: Secondary | ICD-10-CM | POA: Diagnosis not present

## 2013-01-21 DIAGNOSIS — Z170 Estrogen receptor positive status [ER+]: Secondary | ICD-10-CM | POA: Diagnosis not present

## 2013-01-21 LAB — PATHOLOGY REPORT

## 2013-01-21 NOTE — Telephone Encounter (Signed)
Reviewed MammoSite treatment.  Last office exam suggested she may be a candidate.  The role of a trial balloon placement was discussed.  Will coordinate around the radiation oncologist's holiday break.

## 2013-01-22 ENCOUNTER — Telehealth: Payer: Self-pay | Admitting: *Deleted

## 2013-01-22 MED ORDER — CEFADROXIL 500 MG PO CAPS: 500.0000 mg | ORAL_CAPSULE | Freq: Two times a day (BID) | ORAL | Status: DC

## 2013-01-22 NOTE — Telephone Encounter (Signed)
Mammosite schedule reviewed with the patient Placement 02-06-13 at 8:15am at [facility] Scan 02-08-13 Treat 02-11-13 to 02-15-13 Aware Toniann Fail will be calling her for more details Aware of ATB and directions reviewed. Aware no showers and to wear her bra while mammosite in place. Pt agrees.

## 2013-01-22 NOTE — Telephone Encounter (Signed)
Message copied by Currie Paris on Tue Jan 22, 2013  8:37 AM ------      Message from: [name]      Created: Mon Jan 21, 2013  2:48 PM       Please send in prescription and call patient to review mammosite instructions. We have her scheduled with [facility] for 02-06-13. Toniann Fail at the [facility] is aware. Thanks! ------

## 2013-01-23 ENCOUNTER — Encounter: Payer: Self-pay | Admitting: General Surgery

## 2013-01-28 ENCOUNTER — Encounter: Payer: Self-pay | Admitting: General Surgery

## 2013-01-28 LAB — PATHOLOGY REPORT

## 2013-01-29 ENCOUNTER — Ambulatory Visit: Payer: Medicare Other | Admitting: General Surgery

## 2013-02-04 ENCOUNTER — Telehealth: Payer: Self-pay | Admitting: General Surgery

## 2013-02-04 NOTE — Telephone Encounter (Signed)
PT CALLED & WOULD LIKE TO KNOW HOW TO TAKE THE PILLS BEFORE HER MAMMOSITE [facility] FOR 02-06-13. IF YOUR NOT ABLE TO REACH HER @ HER HOME # CALL HER CELL #[phone]/MTH

## 2013-02-06 ENCOUNTER — Other Ambulatory Visit: Payer: Medicare Other

## 2013-02-06 ENCOUNTER — Ambulatory Visit (INDEPENDENT_AMBULATORY_CARE_PROVIDER_SITE_OTHER): Payer: Medicare Other | Admitting: General Surgery

## 2013-02-06 VITALS — BP 124/70 | HR 72 | Resp 12 | Ht 68.0 in | Wt 198.0 lb

## 2013-02-06 DIAGNOSIS — C50919 Malignant neoplasm of unspecified site of unspecified female breast: Secondary | ICD-10-CM

## 2013-02-06 NOTE — Patient Instructions (Signed)
Patient care kit given to patient.  Instructed no showers, sponge bath while mammosite in place, take antibiotic. Follow up with Cancer [facility] as arranged. Discussed wearing your bra for support at all times. 

## 2013-02-06 NOTE — Progress Notes (Addendum)
Patient ID: Eileen Flores, female   DOB: [birth year], 77 y.o.   MRN: [id number]  Chief Complaint  Patient presents with  . Routine Post Op    mammosite placement    HPI Eileen Flores is a 77 y.o. female here today having been evaluated by radiation oncology and found to be a candidate for partial breast radiation. The procedure was reviewed.  HPI  Past Medical History  Diagnosis Date  . Cystitis 2008  . Arthritis 1980  . Mammographic microcalcification   . Bowel trouble 1970  . Allergy   . IBS (irritable bowel syndrome)     since 1970's  . 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 Ovarian cyst removal  1967  . Dermbrasion   1965  . Colonoscopy  2011    Dr. Lemar Livings  . Cataract surgery Bilateral 2009  . Polypectomy  2006  . Thyroidectomy  2006  . Tongue surgery  2009  . Breast surgery Left Oct 2012    benign stereo biopsy  . Breast biopsy Left 2014    stereo biopsy  . Breast surgery Right 01-14-13    excision breast mass  . Breast surgery Left 01-14-13    excision breast cancer    Family History  Problem Relation Age of Onset  . Colon cancer    . Colon polyps    . Cancer Mother 27    liver cancer  . Cancer Father 4    liver cancer  . Alcohol abuse Father     Social History History  Substance Use Topics  . Smoking status: Never Smoker   . Smokeless tobacco: Never Used  . Alcohol Use: No    Allergies  Allergen Reactions  . Penicillin G Rash  . Penicillins Rash    Current Outpatient Prescriptions  Medication Sig Dispense Refill  . cefadroxil (DURICEF) 500 MG capsule Take 1 capsule (500 mg total) by mouth 2 (two) times daily.  20 capsule  0  . levothyroxine (SYNTHROID, LEVOTHROID) 100 MCG tablet Take 100 mcg by mouth daily before breakfast.       No current facility-administered medications for this visit.    Review of Systems Review of Systems  Constitutional: Negative.   Respiratory: Negative.    Cardiovascular: Negative.     Blood pressure 124/70, pulse 72, resp. rate 12, height 5\' 8"  (1.727 m), weight 198 lb (89.812 kg).  Physical Exam Physical Exam  Pulmonary/Chest:      Data Reviewed Ultrasound suggests adequate spacing for MammoSite balloon placement.  Assessment    DCIS, candidate for partial breast radiation.     Plan    The area was prepped with alcohol and 10 cc of 0.5% Xylocaine with 0.25% Marcaine with 1:200,000 units of epinephrine was infiltrated. ChloraPrep was applied to the skin. Under ultrasound guidance the trocar was advanced into the postoperative seroma cavity. The trial balloon was inflated to 45 cm and adequate spacing was appreciated.  The treatment balloon was then placed, and inflated with 45 cc of saline/Conray 60. A small rim of fluid was noted medially and superior, less than 4 mm in diameter. Spacing was 1.68 cm from the balloon to the skin. The balloon was spherical on both visual inspection and ultrasound imaging.  Dry dressing with bacitracin was applied. Instructions were provided for site management. The patient is scheduled for imaging on February 08, 2013. She will continue her Duricef until the balloon is  removed.   The patient will be reevaluated in 2 weeks at which time we'll discuss antiestrogen therapy.       [name] 02/08/2013, 7:48 PM

## 2013-02-07 ENCOUNTER — Ambulatory Visit: Payer: Self-pay | Admitting: Radiation Oncology

## 2013-02-07 DIAGNOSIS — Z510 Encounter for antineoplastic radiation therapy: Secondary | ICD-10-CM | POA: Diagnosis not present

## 2013-02-07 DIAGNOSIS — D0590 Unspecified type of carcinoma in situ of unspecified breast: Secondary | ICD-10-CM | POA: Diagnosis not present

## 2013-02-08 ENCOUNTER — Encounter: Payer: Self-pay | Admitting: General Surgery

## 2013-02-08 DIAGNOSIS — D0590 Unspecified type of carcinoma in situ of unspecified breast: Secondary | ICD-10-CM | POA: Diagnosis not present

## 2013-02-11 DIAGNOSIS — D0590 Unspecified type of carcinoma in situ of unspecified breast: Secondary | ICD-10-CM | POA: Diagnosis not present

## 2013-02-12 ENCOUNTER — Ambulatory Visit: Payer: Medicare Other | Admitting: General Surgery

## 2013-02-12 DIAGNOSIS — D0590 Unspecified type of carcinoma in situ of unspecified breast: Secondary | ICD-10-CM | POA: Diagnosis not present

## 2013-02-13 DIAGNOSIS — D0590 Unspecified type of carcinoma in situ of unspecified breast: Secondary | ICD-10-CM | POA: Diagnosis not present

## 2013-02-14 DIAGNOSIS — D0590 Unspecified type of carcinoma in situ of unspecified breast: Secondary | ICD-10-CM | POA: Diagnosis not present

## 2013-02-15 DIAGNOSIS — D0590 Unspecified type of carcinoma in situ of unspecified breast: Secondary | ICD-10-CM | POA: Diagnosis not present

## 2013-02-21 ENCOUNTER — Encounter: Payer: Self-pay | Admitting: General Surgery

## 2013-02-21 ENCOUNTER — Ambulatory Visit (INDEPENDENT_AMBULATORY_CARE_PROVIDER_SITE_OTHER): Payer: Self-pay | Admitting: General Surgery

## 2013-02-21 VITALS — BP 160/80 | HR 76 | Resp 14 | Ht 69.0 in | Wt 197.0 lb

## 2013-02-21 DIAGNOSIS — C50912 Malignant neoplasm of unspecified site of left female breast: Secondary | ICD-10-CM | POA: Insufficient documentation

## 2013-02-21 DIAGNOSIS — Z853 Personal history of malignant neoplasm of breast: Secondary | ICD-10-CM | POA: Insufficient documentation

## 2013-02-21 DIAGNOSIS — C50919 Malignant neoplasm of unspecified site of unspecified female breast: Secondary | ICD-10-CM

## 2013-02-21 NOTE — Patient Instructions (Addendum)
Discuss Tamoxifen with patient. Patient to return in June for bilateral diagnotic mammograms.

## 2013-02-21 NOTE — Progress Notes (Signed)
Patient ID: Eileen Flores, female   DOB: 1935-10-01, 78 y.o.   MRN: [id number]  Chief Complaint  Patient presents with  . Follow-up    mammosite 02-06-13    HPI Eileen Flores is a 78 y.o. female. Here today for follow up from her mammosite 02-06-13. Mammosite was removed 02/15/13. She reports minimal discomfort during balloon removal. HPI  Past Medical History  Diagnosis Date  . Cystitis 2008  . Arthritis 1980  . Mammographic microcalcification   . Bowel trouble 1970  . Allergy   . IBS (irritable bowel syndrome)     since 1970's  . Cancer 2014    Left breast papillary DCIS. ER 90%; PR 90%  . Malignant neoplasm of upper-outer quadrant of female breast December 2014    Left breast papillary DCIS. ER 90%; PR 90%    Past Surgical History  Procedure Laterality Date  . Ovarian cyst removal  1967  . [place]  . Colonoscopy  2011    Dr. Bary Castilla  . Cataract surgery Bilateral 2009  . Polypectomy  2006  . Thyroidectomy  2006  . Tongue surgery  2009  . Breast surgery Left Oct 2012    benign stereo biopsy  . Breast biopsy Left 2014    stereo biopsy  . Breast surgery Right 01-14-13    excision breast mass  . Breast surgery Left 01-14-13    excision breast cancer    Family History  Problem Relation Age of Onset  . Colon cancer    . Colon polyps    . Cancer Mother 35    liver cancer  . Cancer Father 75    liver cancer  . Alcohol abuse Father     Social History History  Substance Use Topics  . Smoking status: Never Smoker   . Smokeless tobacco: Never Used  . Alcohol Use: No    Allergies  Allergen Reactions  . Penicillin G Rash  . Penicillins Rash    Current Outpatient Prescriptions  Medication Sig Dispense Refill  . cefadroxil (DURICEF) 500 MG capsule Take 1 capsule (500 mg total) by mouth 2 (two) times daily.  20 capsule  0  . levothyroxine (SYNTHROID, LEVOTHROID) 100 MCG tablet Take 100 mcg by mouth daily before breakfast.       No current  facility-administered medications for this visit.    Review of Systems Review of Systems  Constitutional: Negative.   Respiratory: Negative.   Cardiovascular: Negative.     Blood pressure 160/80, pulse 76, resp. rate 14, height 5\' 9"  (1.753 m), weight 197 lb (89.359 kg).  Physical Exam Physical Exam  Constitutional: She is oriented to person, place, and time. She appears well-developed and well-nourished.  Eyes: Conjunctivae are normal.  Pulmonary/Chest:  Bilateral breast biopsy healing well.   Neurological: She is alert and oriented to person, place, and time.  Skin: Skin is warm and dry.    Data Reviewed Pathology showed an intraductal total of the right breast.  The left breast showed DCIS, 8 mm minimal diameter, nuclear grade 2, LCIS, intraductal papilloma, columnar cell changes and focal fibroadenomatous changes.  Assessment    DCIS left breast. ER/PR positive.    Plan    The patient is not particularly interested in antiestrogen therapy. We reviewed the potential decrease risk of in breast recurrence and contralateral cancer. The risks of therapy including those related to DVT/uterine cancer and vasomotor symptoms were discussed.  The patient will research the use of tamoxifen, and notify the  office if she is amenable to a trial.  The need to make use of the medication for a least one month to get through the possible stormy period of weeks 2/3 was emphasized should she elect to make use of medication.        Robert Bellow 02/21/2013, 9:46 PM

## 2013-03-10 ENCOUNTER — Ambulatory Visit: Payer: Self-pay | Admitting: Radiation Oncology

## 2013-05-28 DIAGNOSIS — E039 Hypothyroidism, unspecified: Secondary | ICD-10-CM | POA: Diagnosis not present

## 2013-06-04 DIAGNOSIS — E890 Postprocedural hypothyroidism: Secondary | ICD-10-CM | POA: Diagnosis not present

## 2013-06-06 ENCOUNTER — Ambulatory Visit: Payer: Self-pay | Admitting: Family Medicine

## 2013-06-06 DIAGNOSIS — M1610 Unilateral primary osteoarthritis, unspecified hip: Secondary | ICD-10-CM | POA: Diagnosis not present

## 2013-06-06 DIAGNOSIS — N3090 Cystitis, unspecified without hematuria: Secondary | ICD-10-CM | POA: Diagnosis not present

## 2013-06-06 DIAGNOSIS — Z0000 Encounter for general adult medical examination without abnormal findings: Secondary | ICD-10-CM | POA: Diagnosis not present

## 2013-06-06 DIAGNOSIS — M25559 Pain in unspecified hip: Secondary | ICD-10-CM | POA: Diagnosis not present

## 2013-06-06 DIAGNOSIS — M169 Osteoarthritis of hip, unspecified: Secondary | ICD-10-CM | POA: Diagnosis not present

## 2013-06-06 DIAGNOSIS — M21619 Bunion of unspecified foot: Secondary | ICD-10-CM | POA: Diagnosis not present

## 2013-06-07 DIAGNOSIS — M129 Arthropathy, unspecified: Secondary | ICD-10-CM | POA: Diagnosis not present

## 2013-06-07 DIAGNOSIS — Z136 Encounter for screening for cardiovascular disorders: Secondary | ICD-10-CM | POA: Diagnosis not present

## 2013-06-07 DIAGNOSIS — N3090 Cystitis, unspecified without hematuria: Secondary | ICD-10-CM | POA: Diagnosis not present

## 2013-06-28 DIAGNOSIS — Z961 Presence of intraocular lens: Secondary | ICD-10-CM | POA: Diagnosis not present

## 2013-06-28 DIAGNOSIS — H2510 Age-related nuclear cataract, unspecified eye: Secondary | ICD-10-CM | POA: Diagnosis not present

## 2013-08-01 DIAGNOSIS — Z853 Personal history of malignant neoplasm of breast: Secondary | ICD-10-CM | POA: Diagnosis not present

## 2013-08-02 ENCOUNTER — Encounter: Payer: Self-pay | Admitting: General Surgery

## 2013-08-06 ENCOUNTER — Encounter: Payer: Self-pay | Admitting: General Surgery

## 2013-08-08 ENCOUNTER — Ambulatory Visit: Payer: Medicare Other | Admitting: General Surgery

## 2013-08-14 ENCOUNTER — Ambulatory Visit: Payer: Self-pay | Admitting: Radiation Oncology

## 2013-08-21 ENCOUNTER — Encounter: Payer: Self-pay | Admitting: General Surgery

## 2013-08-21 ENCOUNTER — Ambulatory Visit (INDEPENDENT_AMBULATORY_CARE_PROVIDER_SITE_OTHER): Payer: Medicare Other | Admitting: General Surgery

## 2013-08-21 VITALS — BP 130/74 | HR 74 | Resp 14 | Ht 68.0 in | Wt 193.0 lb

## 2013-08-21 DIAGNOSIS — D0590 Unspecified type of carcinoma in situ of unspecified breast: Secondary | ICD-10-CM

## 2013-08-21 DIAGNOSIS — D0512 Intraductal carcinoma in situ of left breast: Secondary | ICD-10-CM

## 2013-08-21 NOTE — Progress Notes (Signed)
Patient ID: Eileen Flores, female   DOB: 1936-02-06, 78 y.o.   MRN: [id number]  Chief Complaint  Patient presents with  . Follow-up    5 month follow up bilateral diagnostic mammogram     HPI Eileen Flores is a 78 y.o. female who presents for a breast evaluation. The most recent mammogram was done on 08/01/13. Patient does perform regular self breast checks and gets regular mammograms done.  The patient denies any new problems with the breasts at this time.    HPI  Past Medical History  Diagnosis Date  . Cystitis 2008  . Arthritis 1980  . Mammographic microcalcification   . Bowel trouble 1970  . Allergy   . IBS (irritable bowel syndrome)     since 1970's  . Hyperlipidemia   . Cancer 2014    Left breast papillary DCIS. ER 90%; PR 90%  . Malignant neoplasm of upper-outer quadrant of female breast December 2014    Left breast papillary DCIS. ER 90%; PR 90%; declined antiestrogen therapy    Past Surgical History  Procedure Laterality Date  . Ovarian cyst removal  1967  . [place]  . Colonoscopy  2011    Dr. Bary Castilla  . Cataract surgery Bilateral 2009  . Polypectomy  2006  . Thyroidectomy  2006  . Tongue surgery  2009  . Breast surgery Left Oct 2012    benign stereo biopsy  . Breast biopsy Left 2014    stereo biopsy  . Breast surgery Right 01-14-13    excision breast mass, intraductal papilloma  . Breast surgery Left 01-14-13    excision intra-papillary carcinoma, DCIS    Family History  Problem Relation Age of Onset  . Colon cancer    . Colon polyps    . Cancer Mother 20    liver cancer  . Cancer Father 72    liver cancer  . Alcohol abuse Father     Social History History  Substance Use Topics  . Smoking status: Never Smoker   . Smokeless tobacco: Never Used  . Alcohol Use: No    Allergies  Allergen Reactions  . Penicillin G Rash  . Penicillins Rash    Current Outpatient Prescriptions  Medication Sig Dispense Refill  . levothyroxine  (SYNTHROID, LEVOTHROID) 100 MCG tablet Take 100 mcg by mouth daily before breakfast.       No current facility-administered medications for this visit.    Review of Systems Review of Systems  Constitutional: Negative.   Respiratory: Negative.   Cardiovascular: Negative.     Blood pressure 130/74, pulse 74, resp. rate 14, height 5\' 8"  (1.727 m), weight 193 lb (87.544 kg).  Physical Exam Physical Exam  Constitutional: She is oriented to person, place, and time. She appears well-developed and well-nourished.  Neck: Neck supple. No thyromegaly present.  Cardiovascular: Normal rate, regular rhythm and normal heart sounds.   No murmur heard. Pulmonary/Chest: Effort normal and breath sounds normal. Right breast exhibits no inverted nipple, no mass, no nipple discharge, no skin change and no tenderness. Left breast exhibits no inverted nipple, no mass, no nipple discharge, no skin change and no tenderness.  Well healed incision 10-2 o'clock left breast.  Well healed incision 9 o'clock right breast.  Lymphadenopathy:    She has no cervical adenopathy.    She has no axillary adenopathy.  Neurological: She is alert and oriented to person, place, and time.  Skin: Skin is warm and dry.    Data  Reviewed Lateral mammograms dated 08/01/2013 were personally reviewed and compared to previous studies. Postsurgical changes are noted bilaterally. BI-RAD-3.  Left breast: Intraductal papillary carcinoma, 5 mm (posterior) DCIS nuclear grade 2 in a complex sclerosing lesion ( anterior lesion).  Right breast: Intraductal papilloma, 9 mm with focal microcalcifications.  Assessment    Doing well status post excision right breast papilloma, left breast intraductal papillary carcinoma/DCIS.    Plan    We'll arrange for one year followup mammograms in December 2015.  Reviewed the role of antiestrogen therapy. At this time with her responsibilities caring for her husband with early dementia the  potential vasomotor affect may make it more difficult for her to deal with the home situation and will be put on hold at present.    PCP: Etheleen Mayhew 08/21/2013, 2:23 PM

## 2013-08-21 NOTE — Patient Instructions (Signed)
Patient to return in 5 months with a left breast diagnostic mammogram. Continue self breast exams. Call office for any new breast issues or concerns.

## 2013-11-21 DIAGNOSIS — Z23 Encounter for immunization: Secondary | ICD-10-CM | POA: Diagnosis not present

## 2013-12-09 ENCOUNTER — Encounter: Payer: Self-pay | Admitting: General Surgery

## 2013-12-26 DIAGNOSIS — H40053 Ocular hypertension, bilateral: Secondary | ICD-10-CM | POA: Diagnosis not present

## 2014-01-10 DIAGNOSIS — R928 Other abnormal and inconclusive findings on diagnostic imaging of breast: Secondary | ICD-10-CM | POA: Diagnosis not present

## 2014-01-10 DIAGNOSIS — Z853 Personal history of malignant neoplasm of breast: Secondary | ICD-10-CM | POA: Diagnosis not present

## 2014-01-10 DIAGNOSIS — N6489 Other specified disorders of breast: Secondary | ICD-10-CM | POA: Diagnosis not present

## 2014-01-15 ENCOUNTER — Encounter: Payer: Self-pay | Admitting: General Surgery

## 2014-01-23 ENCOUNTER — Ambulatory Visit: Payer: Medicare Other | Admitting: General Surgery

## 2014-02-10 ENCOUNTER — Ambulatory Visit: Payer: Medicare Other | Admitting: General Surgery

## 2014-02-10 DIAGNOSIS — H40003 Preglaucoma, unspecified, bilateral: Secondary | ICD-10-CM | POA: Diagnosis not present

## 2014-02-12 ENCOUNTER — Ambulatory Visit (INDEPENDENT_AMBULATORY_CARE_PROVIDER_SITE_OTHER): Payer: Medicare Other | Admitting: General Surgery

## 2014-02-12 ENCOUNTER — Encounter: Payer: Self-pay | Admitting: General Surgery

## 2014-02-12 VITALS — BP 130/74 | HR 76 | Resp 14 | Ht 68.0 in | Wt 196.0 lb

## 2014-02-12 DIAGNOSIS — C50912 Malignant neoplasm of unspecified site of left female breast: Secondary | ICD-10-CM | POA: Diagnosis not present

## 2014-02-12 NOTE — Patient Instructions (Signed)
The patient has been asked to return to the office in one year with a bilateral diagnostic mammogram. 

## 2014-02-12 NOTE — Progress Notes (Signed)
Patient ID: Eileen Flores, female   DOB: November 09, 1935, 79 y.o.   MRN: [id number]  Chief Complaint  Patient presents with  . Follow-up    mammogram    HPI Eileen Flores is a 79 y.o. female who presents for a breast evaluation. The most recent left breast  mammogram was done on 01/10/14 Patient does perform regular self breast checks and gets regular mammograms done.    HPI  Past Medical History  Diagnosis Date  . Cystitis 2008  . Arthritis 1980  . Mammographic microcalcification   . Bowel trouble 1970  . Allergy   . IBS (irritable bowel syndrome)     since 1970's  . Hyperlipidemia   . Cancer 2014    Left breast papillary DCIS. ER 90%; PR 90%  . Malignant neoplasm of upper-outer quadrant of female breast December 2014    Left breast papillary DCIS. ER 90%; PR 90%; declined antiestrogen therapy    Past Surgical History  Procedure Laterality Date  . Ovarian cyst removal  1967  . [place]  . Colonoscopy  2011    Dr. Bary Castilla  . Cataract surgery Bilateral 2009  . Polypectomy  2006  . Thyroidectomy  2006  . Tongue surgery  2009  . Breast surgery Left Oct 2012    benign stereo biopsy  . Breast biopsy Left 2014    stereo biopsy  . Breast surgery Right 01-14-13    excision breast mass, intraductal papilloma  . Breast surgery Left 01-14-13    excision intra-papillary carcinoma, DCIS    Family History  Problem Relation Age of Onset  . Colon cancer    . Colon polyps    . Cancer Mother 22    liver cancer  . Cancer Father 43    liver cancer  . Alcohol abuse Father     Social History History  Substance Use Topics  . Smoking status: Never Smoker   . Smokeless tobacco: Never Used  . Alcohol Use: No    Allergies  Allergen Reactions  . Penicillin G Rash  . Penicillins Rash    Current Outpatient Prescriptions  Medication Sig Dispense Refill  . timolol (TIMOPTIC) 0.5 % ophthalmic solution     . levothyroxine (SYNTHROID, LEVOTHROID) 100 MCG tablet Take 100 mcg  by mouth daily before breakfast.     No current facility-administered medications for this visit.    Review of Systems Review of Systems  Constitutional: Negative.   Respiratory: Negative.   Cardiovascular: Negative.     Blood pressure 130/74, pulse 76, resp. rate 14, height 5\' 8"  (1.727 m), weight 196 lb (88.905 kg).  Physical Exam Physical Exam  Constitutional: She is oriented to person, place, and time. She appears well-developed and well-nourished.  Eyes: Conjunctivae are normal. No scleral icterus.  Neck: Neck supple.  Cardiovascular: Normal rate, regular rhythm and normal heart sounds.   Pulmonary/Chest: Effort normal and breath sounds normal. Right breast exhibits no inverted nipple, no mass, no nipple discharge, no skin change and no tenderness. Left breast exhibits no inverted nipple, no mass, no nipple discharge, no skin change and no tenderness.    Well healed incision in the left breat upper outer quadrant. Well healed incision right breast a little thickening.   Abdominal: Soft. Bowel sounds are normal. There is no tenderness.  Neurological: She is alert and oriented to person, place, and time.  Skin: Skin is dry.    Data Reviewed Bilateral diagnostic mammograms dated 01/10/2014 at [facility] were  reviewed. Asymmetry on the right found to represent a cystic lesion on ultrasound. BI-RADS2.  Assessment    Benign breast exam. Patient continues to decline antiestrogen therapy.     Plan    The patient has been asked to return to the office in one year with a bilateral diagnostic mammogram.     PCP:  Etheleen Mayhew 02/14/2014, 2:32 PM

## 2014-02-14 ENCOUNTER — Encounter: Payer: Self-pay | Admitting: General Surgery

## 2014-02-19 DIAGNOSIS — E890 Postprocedural hypothyroidism: Secondary | ICD-10-CM | POA: Diagnosis not present

## 2014-02-19 DIAGNOSIS — H6120 Impacted cerumen, unspecified ear: Secondary | ICD-10-CM | POA: Diagnosis not present

## 2014-02-19 DIAGNOSIS — R51 Headache: Secondary | ICD-10-CM | POA: Diagnosis not present

## 2014-05-23 DIAGNOSIS — E890 Postprocedural hypothyroidism: Secondary | ICD-10-CM | POA: Diagnosis not present

## 2014-05-30 DIAGNOSIS — E890 Postprocedural hypothyroidism: Secondary | ICD-10-CM | POA: Diagnosis not present

## 2014-05-30 NOTE — Consult Note (Signed)
Reason for Visit: This 79 year old Female patient presents to the clinic for initial evaluation of  breast cancer .   Referred by Dr. Hervey Ard.  Diagnosis:  Chief Complaint/Diagnosis   -year-old female status post recent resection of the 9 papilloma from the right breast and a complex sclerosing lesion of the left breast showing ductal carcinoma in situ as well as papillary carcinoma in situ.  Pathology Report initial biopsy report reviewed recent wide local excision pending   Imaging Report mammograms reviewed   Referral Report clinical notes reviewed   Planned Treatment Regimen possible whole breast radiation versus accelerated partial breast irradiation   HPI   patient is a 79 year old female who presented with new left breast calcifications presenting as a hypoechoic mass in the 11:00 position measuring 6 x 5 mm.patient underwent biopsy which was positive for papillary carcinoma in situ as well as ductal carcinoma in situ. She also had a papilloma removed from her right breast. Final pathology on reexcision of the left breast is unavailable at this point although the patient has been referred to me for opinion. She is otherwise doing well. She specifically denies breast tenderness cough or bone pain.patient did have the mammoplasty and I am in discussions with Dr. Tollie Pizza whether MammoSite catheter couldn't be placed.  Past Hx:    Breast Cancer, Left:    Hypothyroidism:    Acid Reflux:    TMJ:   Past, Family and Social History:  Past Medical History positive   Gastrointestinal GERD; irritable bowel   Genitourinary history of cystitis   Endocrine hypothyroidism   Past Surgical History polypectomy, thyroidectomy, tongue surgery   Past Medical History Comments TMJ, arthritis   Family History positive   Family History Comments family history father with liver cancer mother with liver cancer   Social History noncontributory   Additional Past Medical and  Surgical History seen today by with her husband and nurse navigator   Allergies:   Penicillin: Rash  Nuts: N/V/Diarrhea  Home Meds:  Home Medications: Medication Instructions Status  levothyroxine 112 mcg (0.112 mg) oral tablet 1 tab(s) orally once a day Active  Tylenol 325 mg oral tablet 2 tab(s) orally every 4 hours, As Needed - for Pain Active   Review of Systems:  General negative   Performance Status (ECOG) 0   Skin negative   Breast see HPI   Ophthalmologic negative   ENMT negative   Respiratory and Thorax negative   Cardiovascular negative   Gastrointestinal negative   Genitourinary negative   Musculoskeletal negative   Neurological negative   Psychiatric negative   Hematology/Lymphatics negative   Endocrine negative   Allergic/Immunologic negative   Review of Systems   review of systems obtained from nurse's notes  Nursing Notes:  Nursing Vital Signs and Chemo Nursing Nursing Notes: *CC Vital Signs Flowsheet:   15-Dec-14 11:03  Temp Temperature 98  Pulse Pulse 68  Respirations Respirations 20  SBP SBP 176  DBP DBP 79  Pain Scale (0-10)  1  Current Weight (kg) (kg) 90.5  Height (cm) centimeters 170.7  BSA (m2) 2   Physical Exam:  General/Skin/HEENT:  General normal   Skin normal   Eyes normal   ENMT normal   Head and Neck normal   Additional PE well-developed female in NAD. Her right breast has a recent wide local excision scar which is healing well. Left breast also a wide local excision scar which is healing well. Patient has a lumpy feel to her  breast overall consistent with fibrocystic disease. No dominant mass or nodularity is noted in either breast into position examined. Lungs are clear to A&P cardiac examination shows regular rate and rhythm.   Breasts/Resp/CV/GI/GU:  Respiratory and Thorax normal   Cardiovascular normal   Gastrointestinal normal   Genitourinary normal   MS/Neuro/Psych/Lymph:  Musculoskeletal  normal   Neurological normal   Lymphatics normal   Other Results:  Radiology Results: LabUnknown:    23-Oct-14 10:36, Digital Diagnostic Mammogram Bilateral  PACS [facility]:  Digital Diagnostic Mammogram Bilateral   REASON FOR EXAM:    LT BRST MASS FU AND YRLY  COMMENTS:       PROCEDURE: MAM - MAM DGTL DIAGNOSTIC MAMMO W/CAD  - Nov 29 2012 10:36AM     CLINICAL DATA:  Prior benign left biopsy for calcifications in 2012;  short term followup of nodular density right breast    EXAM:  DIGITAL SCREENING BILATERAL MAMMOGRAM WITH CAD; BILATERAL BREAST  ULTRASOUND - NO CHARGE    COMPARISON:  06/04/2012, 11/28/2011, 05/26/2011, 09/22/2010,  06/18/2009, 04/30/2008  ACR Breast Density Category b: There are scattered areas of  fibroglandular density.    FINDINGS:  On the right, there is an obscured 1cm mass anteriorly in the 10  o'clock position. On the left, there is an obscured mass posteriorly  in the 11 o'clock position. There are also new calcifications on the  left, within 2.5cm of the marker clip from prior benign stereotactic  biopsy. These are clustered punctate calcifications covering an area  of 58mm. These are located anterior and lateral to the obscured mass,  separated from the mass by approximately3cm.    Mammographic images were processed with CAD.    On physical exam there are no palpable abnormalities.  Ultrasound is performed, showing a solid oval mass with  microlobuated borders in the 10 o'clock position of the right  breast, 5cm from the nipple. The mass demonstrates internal vascular  blood flow. It measures 8 x 7 x 40mm. The right axilla shows only  normal lymph nodes.    On the left, ultrasound demonstrates a round circumscribed  hypoechoic mass with well defined echogenic posterior wall deep in  the 11 o'clock position, 7cm from the nipple. It measures 6 x 5 x  18mm.     IMPRESSION:  Suspicious right breast mass. New left  breast  calcifications.Probable mildly complicated cyst left breast.  RECOMMENDATION:  Recommend ultrasound-guided core biopsy of right breast mass.  Recommend stereotactic core biopsy of left calcifications. Recommend  6 month followup diagnostic left mammogram and ultrasound for  probable left cyst.    I have discussed the findings and recommendations with the patient.  Results were also provided in writing at the conclusion of the  visit. If applicable, a reminder letter will be sent to the patient  regarding the next appointment. The patient has an appontment with  Dr. Bary Castilla on December 06, 2012.    BI-RADS CATEGORY  4: Suspicious abnormality - biopsy should be  considered.  Electronically Signed    By: Skipper Cliche M.D.    On: 11/29/2012 12:25         Verified By: Rachael Fee, M.D.,   Relevent Results:   Relevant Scans and Labs mammograms reviewed.   Assessment and Plan: Impression:   ductal carcinoma in situ of the left breast without full final pathology report available in 79 year old female. Patient would benefit from whole breast radiation were accelerated partial breast irradiation.  Plan:   at this time if person discussed the case with Dr. Hervey Ard. He may attempt to place a elliptical MammoSite bulb in the lumpectomy site and is seen the patient in his office next week. She not be able to perform that we'll go ahead with whole breast radiation to 5000 cGy boosting or scar another 1400 cGy using electron beam. Risks and benefits of treatment were reviewed with the patient and her husband and both seem to comprehend our treatment plan well. We'll make final recommendations after final pathology report is reviewed.  I would like to take this opportunity to thank you for allowing me to continue to participate in this patient's care.  CC Referral:  cc: Dr. Hervey Ard, Dr. Margarita Rana   Electronic Signatures: Baruch Gouty, Roda Shutters (MD)  (Signed  15-Dec-14 12:22)  Authored: HPI, Diagnosis, Past Hx, PFSH, Allergies, Home Meds, ROS, Nursing Notes, Physical Exam, Other Results, Relevent Results, Encounter Assessment and Plan, CC Referring Physician   Last Updated: 15-Dec-14 12:22 by Armstead Peaks (MD)

## 2014-05-30 NOTE — Op Note (Signed)
PATIENT NAME:  Eileen Flores, Eileen Flores MR#:  [id number] DATE OF BIRTH:  1935/10/15  DATE OF PROCEDURE:  01/14/2013  PREOPERATIVE DIAGNOSES: 1.  Papilloma, right breast.  2.  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   POSTOPERATIVE DIAGNOSIS: 1.  Papilloma, right breast.  2.  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   OPERATIVE PROCEDURE: 1.  Wide excision of right breast papilloma.  2.  Wide excision and mastoplasty of the left breast lesions.   SURGEON: Hervey Ard, MD    ANESTHESIA: General by LMA, Marcaine 0.5% with 1:200,000 units of epinephrine, 30 mL local infiltration.   CLINICAL NOTE: This 79 year old woman was noted to have a mammographic and ultrasound abnormality in the right breast, and ultrasound guided core biopsy showed evidence of papilloma. There was a small, 5 mm lesion in the 11 o'clock position of the left breast, which on aspiration showed atypical cells, and on core biopsy showed a papilloma with papillary carcinoma in situ. An area of calcifications in the 12 o'clock position of the breast was identified on stereotactic biopsy showing DCIS. The patient was felt to be a candidate for excision of these lesions.   OPERATIVE NOTE: With the patient under adequate general anesthesia, the chest, lower neck and axilla were prepped with ChloraPrep and draped. Attention was turned to the nonmalignant right breast. Ultrasound was used to confirm location of the area. 0.5% Marcaine with 1:200,000 units epinephrine was used for local anesthesia and well tolerated. A radial incision was made at the 9 o'clock position and extended down to the adipose tissue. The area of palpable thickening of the previous biopsy site was then excised in a 3 x 3 x 4 cm block of tissue. The specimen  was orientated and specimen radiograph confirmed the area of concern was removed. The deep tissue was approximated with interrupted 2-0 Vicryl figure-of-eight sutures in multiple layers. The skin was closed with a running 4-0 Vicryl subcuticular suture. Benzoin and Steri-Strips, followed by Telfa and Tegaderm dressing was applied.   Attention was then turned to the left breast. The area of the clips in the 11 and 12 o'clock position was confirmed by intraoperative ultrasound. Review of the post biopsy mammogram showed that the span between the 2014 and 2013 clips was approximately 6 mm in transverse width. This area was blocked out as a 4 x 6 cm x 5 cm deep area beginning at the junction of the adipose tissue and the breast parenchyma. The skin was incised sharply after infiltration with 20 mL of the above-mentioned local anesthetic. The block of tissue was then excised, orientated and specimen radiograph confirmed the area of concern had been removed. The breast was elevated off the underlying pectoralis fascia and approximated with interrupted 2-0 Vicryl figure-of-eight sutures in multiple layers. The deep dermal layer was approximated with interrupted 2-0 Vicryl sutures and the skin closed with a running 4-0 Vicryl subcuticular suture. Specimen radiograph confirmed all previously placed clips had been removed. Benzoin and Steri-Strips were applied. Fluff gauze was placed, followed by a Kerlix wrap and an Ace wrap.   The patient tolerated the procedure well and was taken to the recovery room in stable condition.    ____________________________ Robert Bellow, MD jwb:cg D: 01/14/2013 19:00:36 ET T: 01/14/2013 22:10:27  ET JOB#: [id number]  cc: Robert Bellow, MD, <Dictator> Jerrell Belfast, MD Trendon Zaring Amedeo Kinsman MD ELECTRONICALLY SIGNED 01/15/2013 9:27

## 2014-06-01 NOTE — Consult Note (Signed)
Brief Consult Note: Diagnosis: Foreign body impaction.   Patient was seen by consultant.   Discussed with Attending MD.   Comments: Patient with acute dysphagia secondary to food impaction. Unable to drink liquids. Glucagon given in ER did not relieve symptoms. Prior h/o intermittent dysphagia but has never had an EGD.  Recommendations: Urgent EGD. The procedure and its potential complications were discussed with her and she is in full agreement.  Electronic Signatures: Jill Side (MD)  (Signed [phone] 17:35)  Authored: Brief Consult Note   Last Updated: 30-May-13 17:35 by Jill Side (MD)

## 2014-06-01 NOTE — Consult Note (Signed)
PATIENT NAME:  Eileen Flores, Eileen Flores MR#:  [id number] DATE OF BIRTH:  Jan 06, 1936  DATE OF CONSULTATION:  07/07/2011  REFERRING PHYSICIAN:   CONSULTING PHYSICIAN:  Jill Side, MD  REASON FOR CONSULTATION: Acute dysphagia.   HISTORY OF PRESENT ILLNESS: This is a 79 year old female with a history of prior thyroidectomy and hypothyroidism. The patient has history of intermittent dysphagia to solids although she has never had complete esophageal obstruction and has never required an upper gastrointestinal endoscopy. Apparently she was eating some pork at around 10:30 this morning. She felt that a piece of pork has gotten stuck into the mid chest area. She was unable to regurgitate it. The patient came to the Emergency Room after 3 or 4 hours. Some liquids were given to her, but she was unable to tolerate any liquids and regurgitated it immediately. I was called by the Emergency Room physician and plan was made for an urgent endoscopy. It was also recommended that the patient receive some glucagon. The patient received a milligram of glucagon without significant improvement. The patient was seen up in the endoscopy room. She was still complaining of some discomfort in the mid chest area. Denies any fever or chills. Denies any trouble breathing.   PAST MEDICAL HISTORY:  1. Hypothyroidism, prior thyroidectomy.  2. History of gastroesophageal reflux disease, but she does not take any medicine for acid reflux. 3. Denies any other significant medical problems.   ALLERGIES: Penicillin.   HOME MEDICATIONS: Synthroid.   SOCIAL HISTORY: Unremarkable.   REVIEW OF SYSTEMS: Grossly negative except for what is mentioned in the history of present illness.   PHYSICAL EXAMINATION:  GENERAL: A somewhat obese female, does not appear to be in any acute distress.   VITAL SIGNS: Her vitals are stable.   SKIN: Skin color is normal.   NEUROLOGICAL: Fully awake, alert and oriented. Does not appear to be in any acute  distress.   LUNGS: Clear to auscultation bilaterally.   CARDIOVASCULAR: Regular rate and rhythm. No gallops or murmur.   ABDOMEN: Quite soft, slightly distended, but nontender. Bowel sounds positive. No hepatosplenomegaly or ascites were noted.   EXTREMITIES: No edema, clubbing, or cyanosis.   LABORATORY, DIAGNOSTIC, AND RADIOLOGICAL DATA: White cell count is 11.2, hemoglobin 14.4, hematocrit is 44.1. Platelet count 304. Electrolytes are fairly unremarkable as well as BUN and creatinine. Serum lipase is normal.   ASSESSMENT AND PLAN: The patient with acute foreign body impaction of the esophagus. She did not tolerate liquids given to her in the Emergency Room. The patient has received glucagon without significant improvement. The procedure of upper gastrointestinal endoscopy was discussed with the patient in detail along with its complications and she is in full agreement. Further recommendations to follow after an upper gastrointestinal endoscopy.    ____________________________ Jill Side, MD si:ap D: 07/07/2011 17:54:21 ET T: 07/08/2011 08:32:12 ET JOB#: [id number]  cc: Jill Side, MD, <Dictator> Jill Side MD ELECTRONICALLY SIGNED 07/08/2011 18:36

## 2014-06-10 DIAGNOSIS — H40003 Preglaucoma, unspecified, bilateral: Secondary | ICD-10-CM | POA: Diagnosis not present

## 2014-09-26 IMAGING — CR US BIOPSY BREAST CORE VACUUM ASSIST
7 of 8 series · 7 of 8 positions shown · non-contrast
Comparison: none

[CC (1 of 7)]
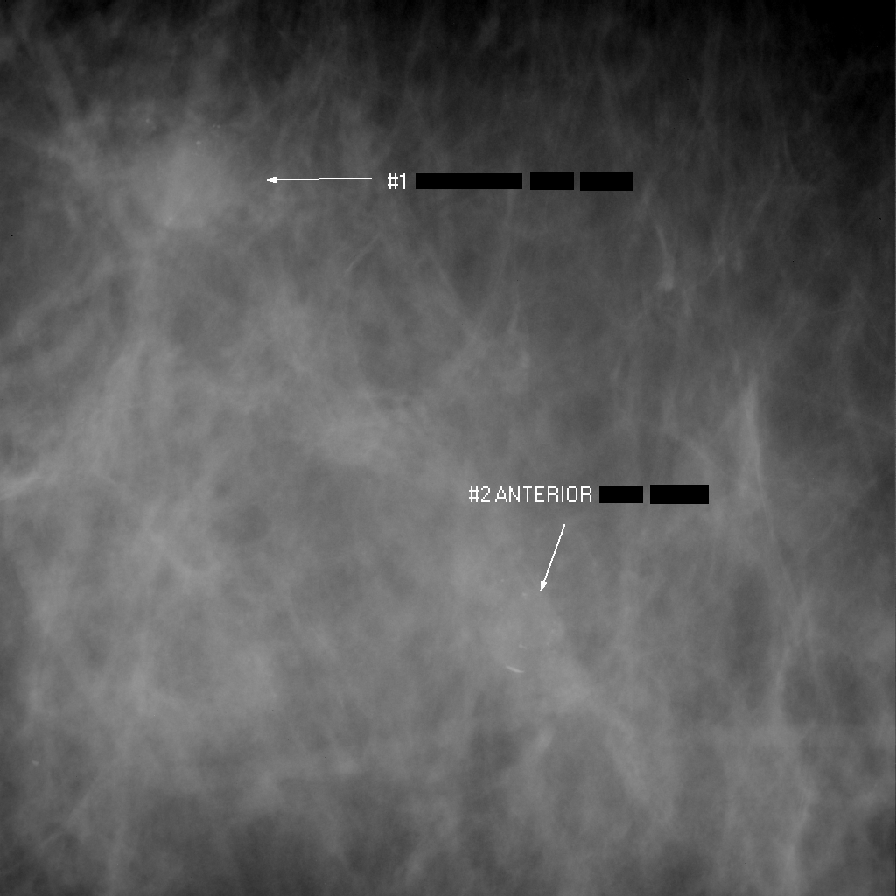

[CC (2 of 7)]
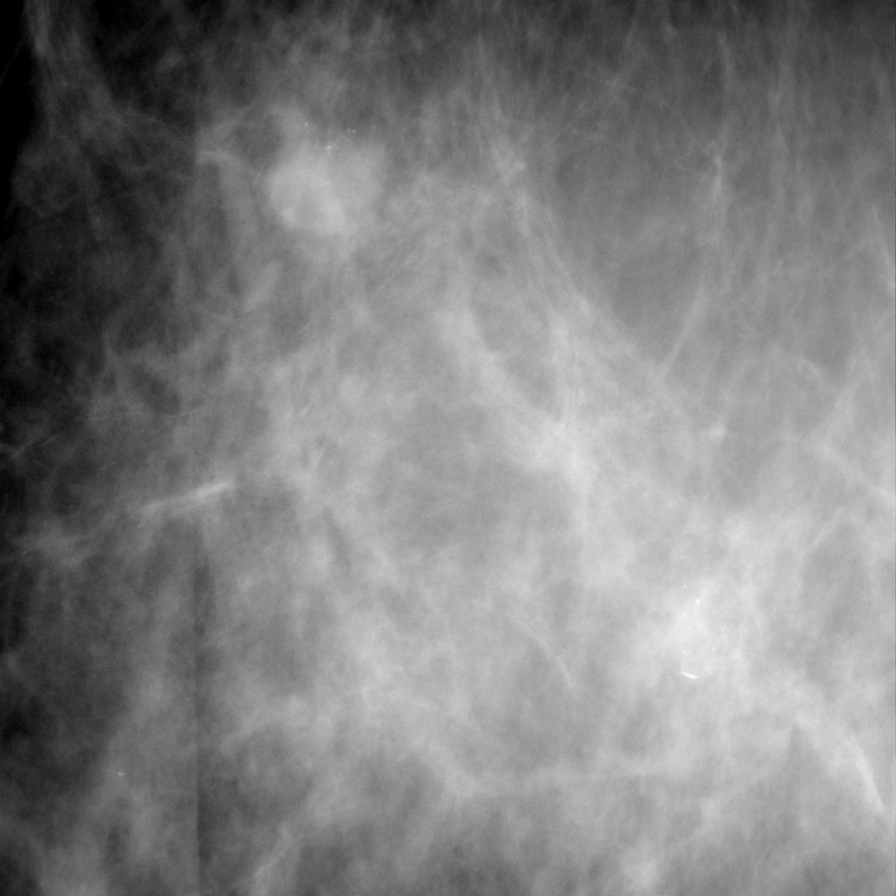

[CC (3 of 7)]
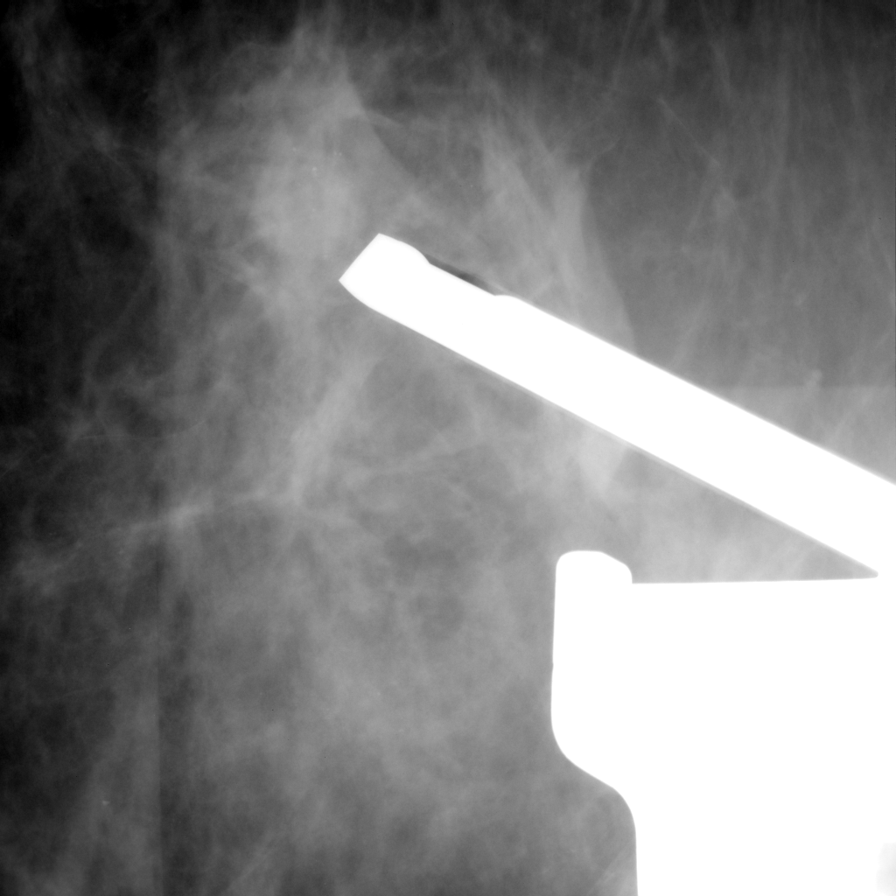

[CC (4 of 7)]
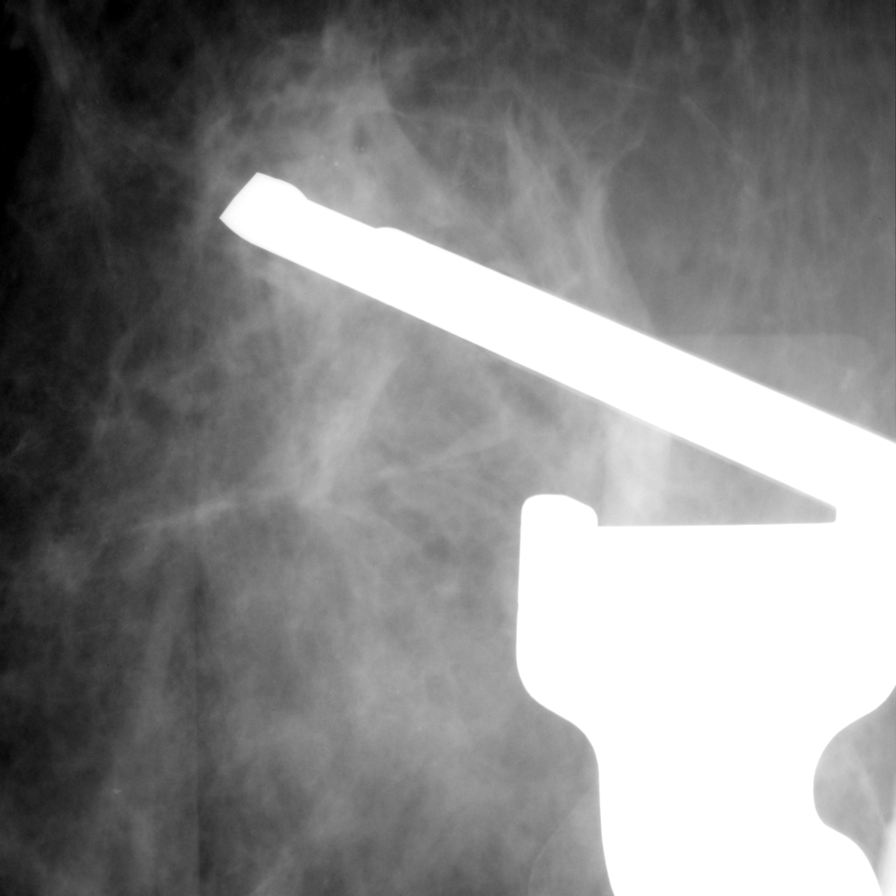

[CC (5 of 7)]
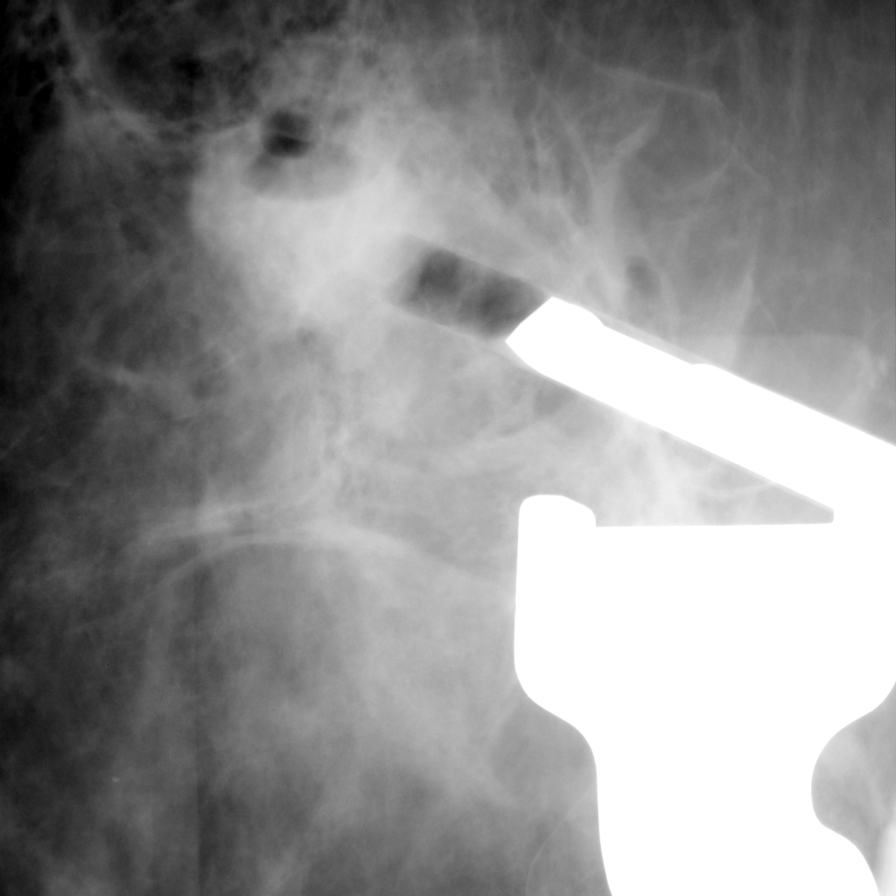

[CC (6 of 7)]
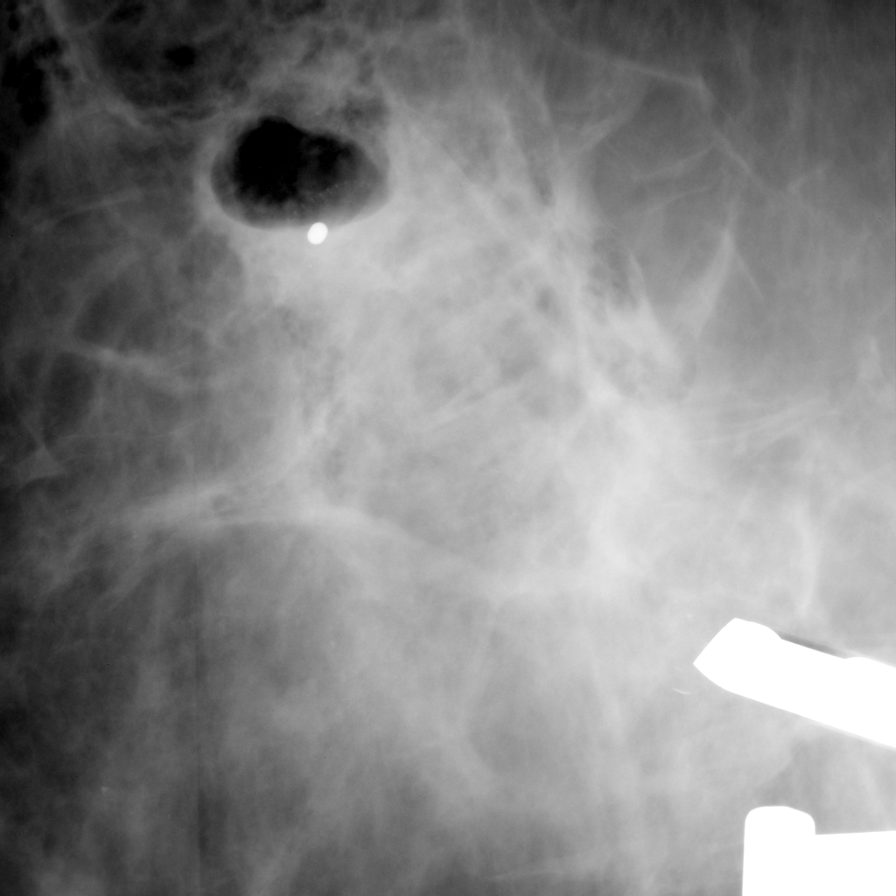

[CC (7 of 7)]
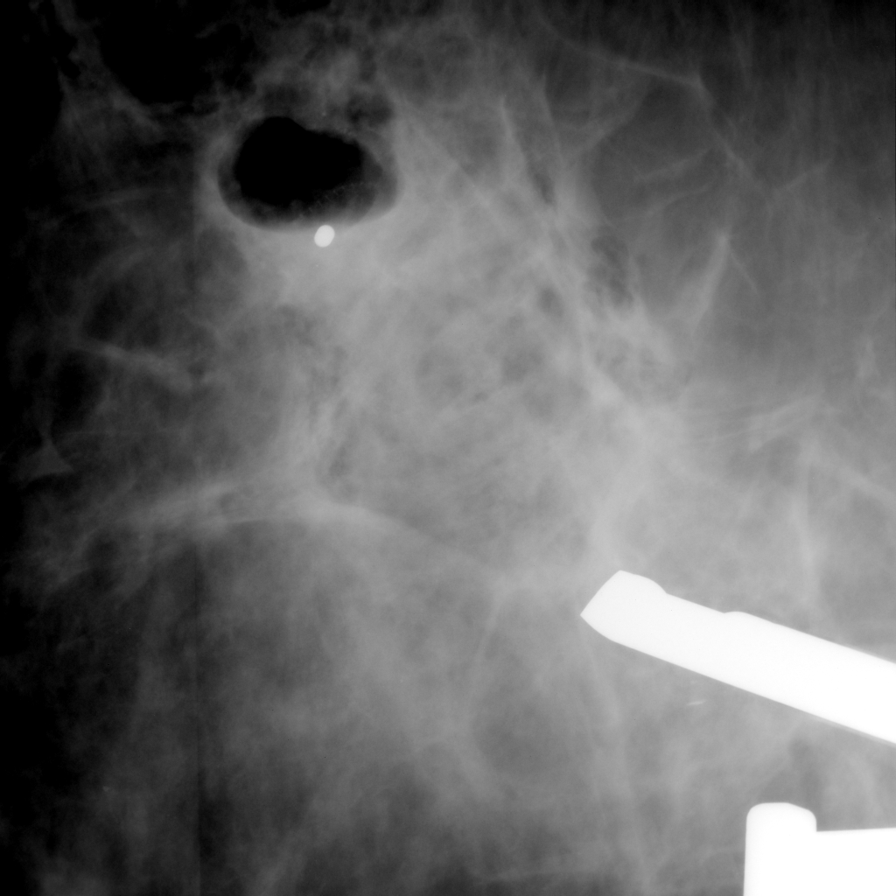

[7 of 8 positions shown; findings below may reference images not displayed]

Canned report from images found in remote index.

Refer to host system for actual result text.

## 2014-10-20 IMAGING — MG MM POST US BIOPSY *L*
1 series · 2 of 2 positions shown · non-contrast
Comparison: Previous exam dated 11/29/2012

CLINICAL DATA: Patient status post recent stereotactic guided
biopsy of 2 sites within the left breast. Pathology results
demonstrated intraductal papillary carcinoma at the posterior biopsy
site and DCIS at the anterior biopsy site. No initial post biopsy
images were obtained. Patient is seen back for evaluation of clip
location. Biopsy was performed by Dr. Ferienhaus.

EXAM:
DIGITAL DIAGNOSTIC  LEFT MAMMOGRAM WITH CAD

[L CC · left · 2 of 2 slices shown]
[im 1/2]
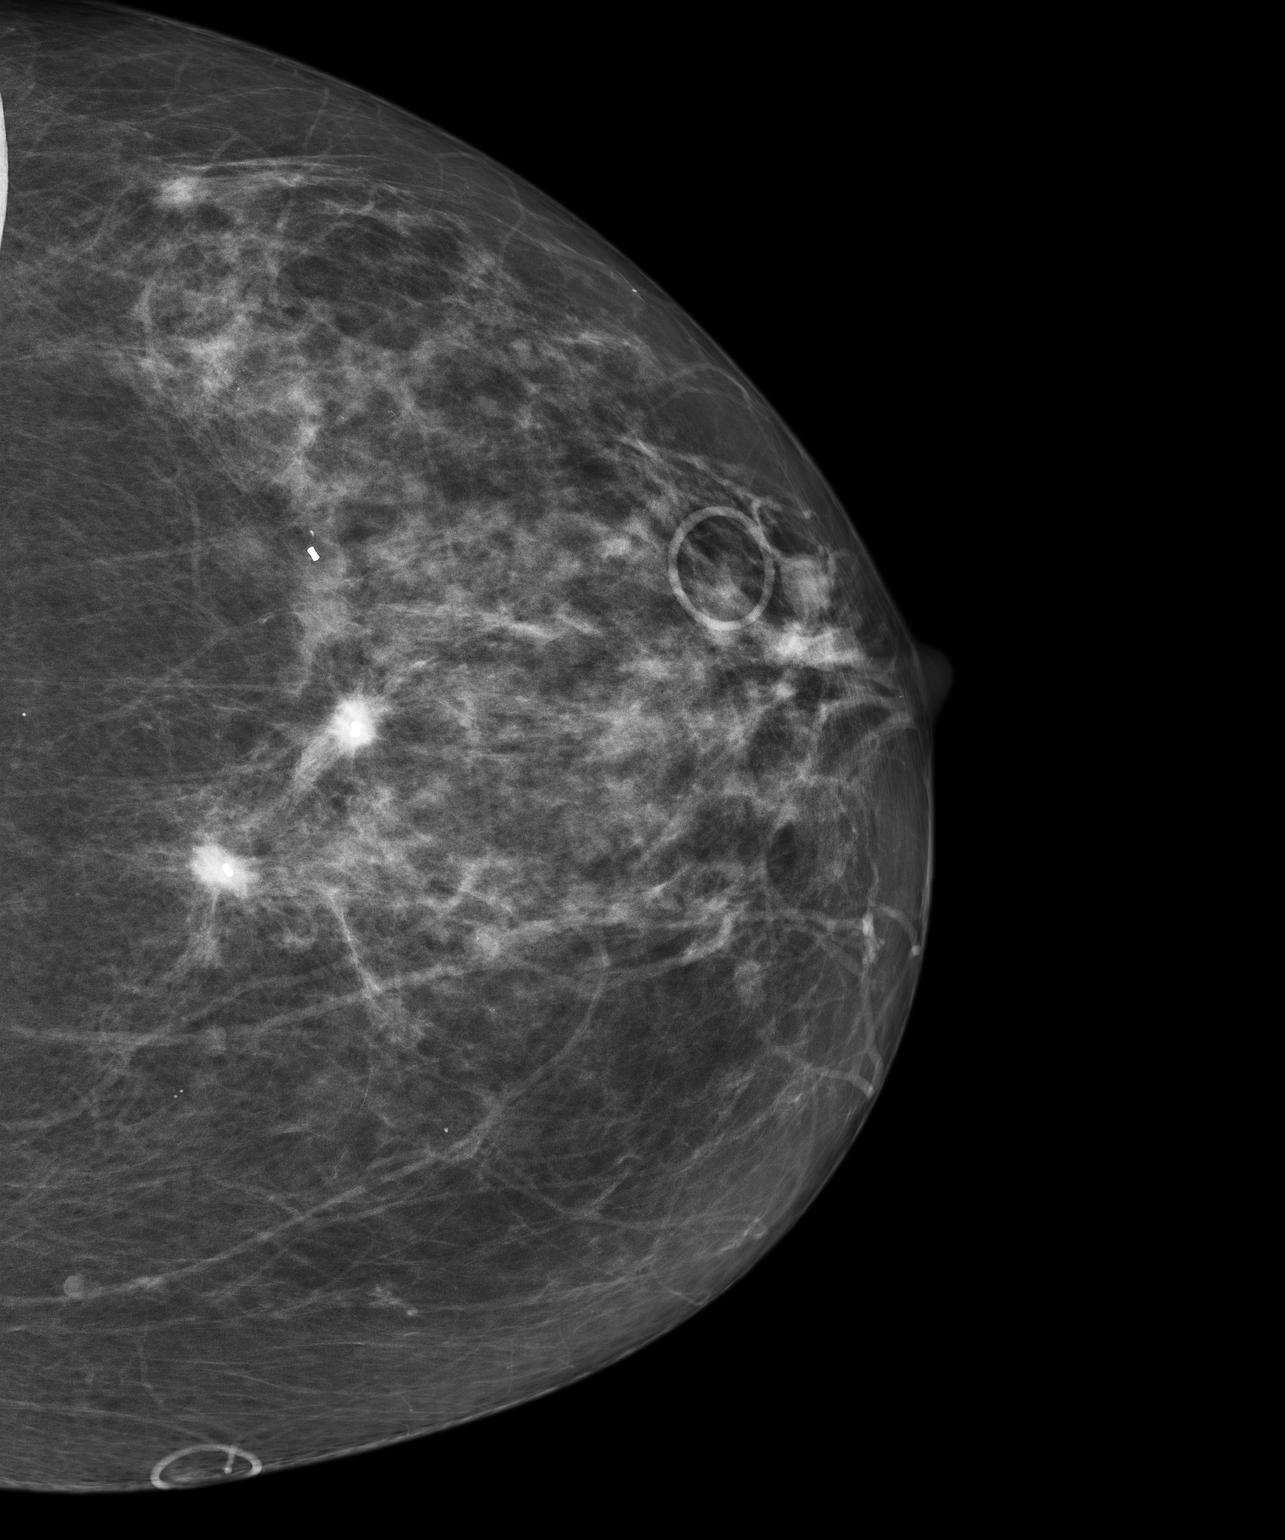
[im 2/2]
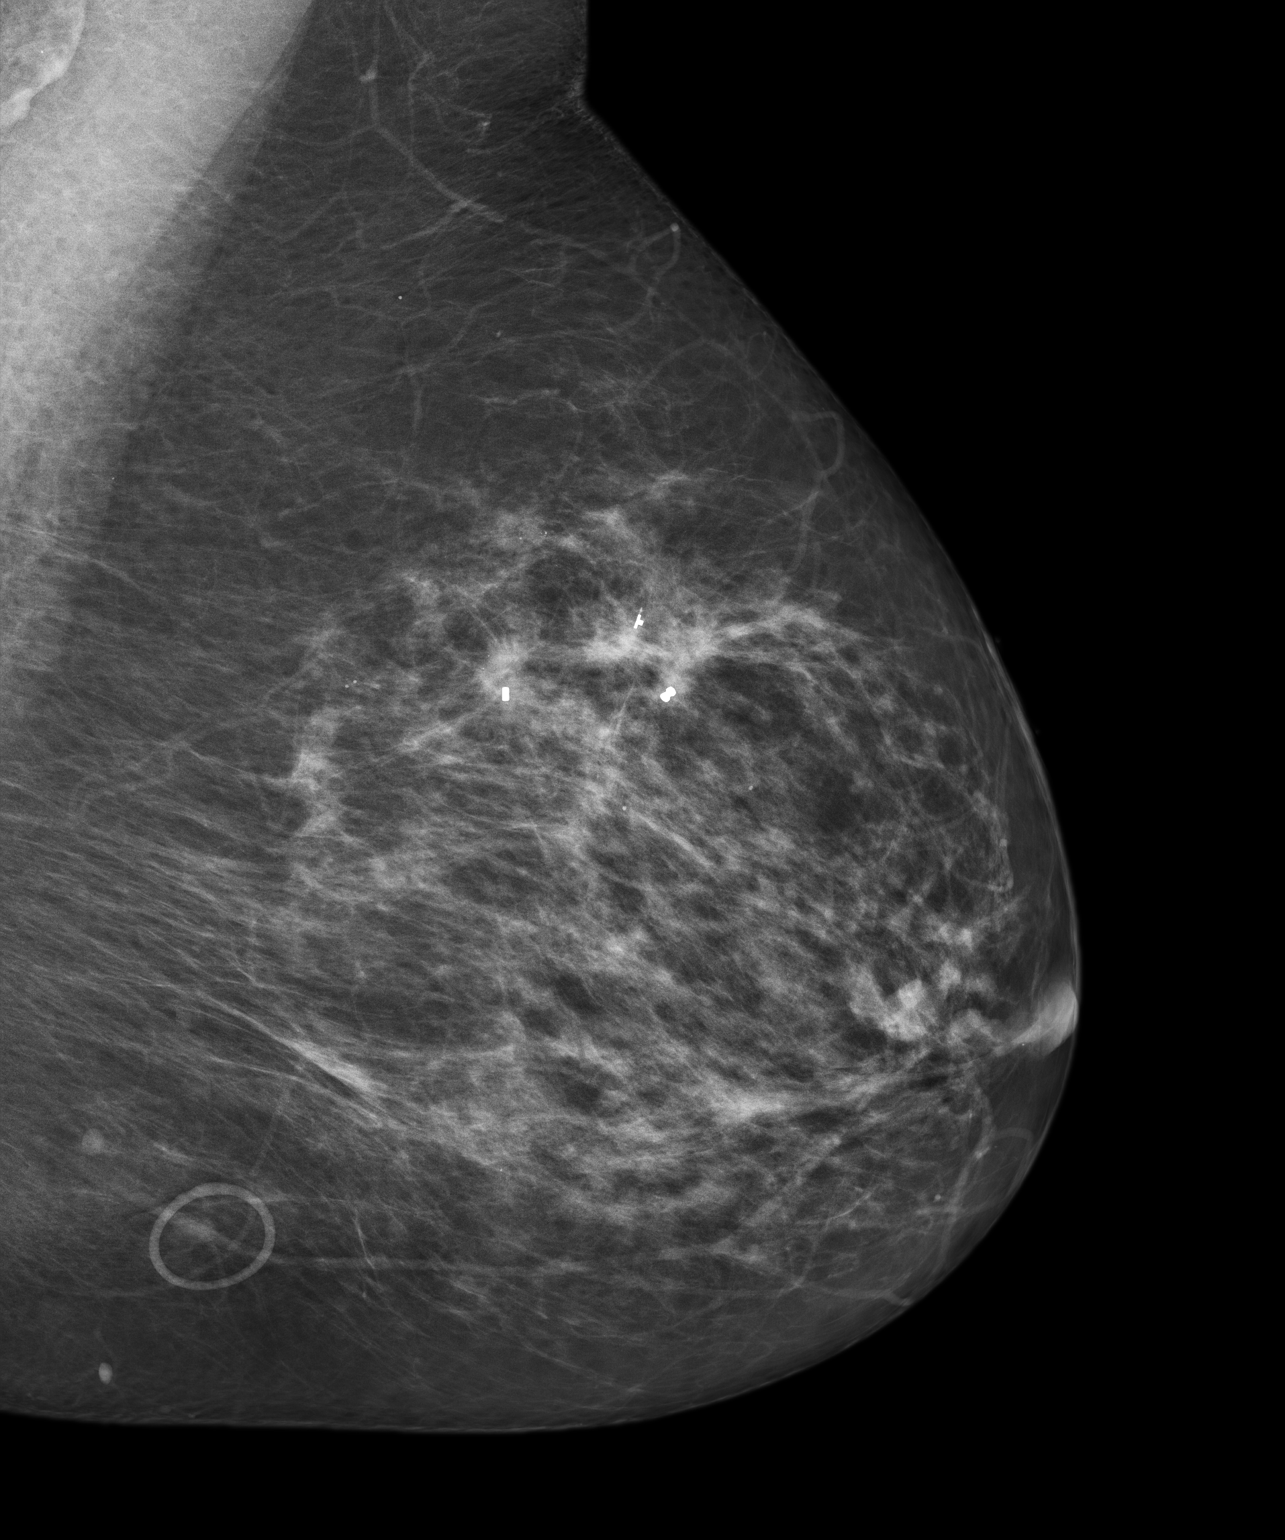

[2 of 2 positions shown; findings below may reference images not displayed]

ACR Breast Density Category b: There are scattered areas of
fibroglandular density.
FINDINGS: When compared to the prior examination there are 2 new biopsy clips
demonstrated within the 11 and 12 o'clock position left breast
middle depth. These clips demonstrate surrounding density likely
representing post biopsy hematoma/scarring. These clips are 3 cm
apart. Biopsy results for these 2 positions are as documented above
and surgical excision of these sites is recommended. Biopsy was
performed by Dr. Ferienhaus.

Mammographic images were processed with CAD.
IMPRESSION: Two new biopsy marking clips within the left breast 11 and 12
o'clock position middle depth after left breast stereotactic guided
biopsy. Pathology results as above. Surgical excision is
recommended.

RECOMMENDATION:
Surgical excision for left breast intraductal papillary carcinoma
and DCIS as above.

I have discussed the findings and recommendations with the patient.
Results were also provided in writing at the conclusion of the
visit. If applicable, a reminder letter will be sent to the patient
regarding the next appointment.

BI-RADS CATEGORY  6: Known biopsy-proven malignancy - appropriate
action should be taken.

## 2014-11-12 DIAGNOSIS — Z23 Encounter for immunization: Secondary | ICD-10-CM | POA: Diagnosis not present

## 2014-12-08 DIAGNOSIS — H40003 Preglaucoma, unspecified, bilateral: Secondary | ICD-10-CM | POA: Diagnosis not present

## 2014-12-15 DIAGNOSIS — H40003 Preglaucoma, unspecified, bilateral: Secondary | ICD-10-CM | POA: Diagnosis not present

## 2015-01-16 DIAGNOSIS — R590 Localized enlarged lymph nodes: Secondary | ICD-10-CM | POA: Diagnosis not present

## 2015-01-16 DIAGNOSIS — R928 Other abnormal and inconclusive findings on diagnostic imaging of breast: Secondary | ICD-10-CM | POA: Diagnosis not present

## 2015-01-16 DIAGNOSIS — Z853 Personal history of malignant neoplasm of breast: Secondary | ICD-10-CM | POA: Diagnosis not present

## 2015-01-19 ENCOUNTER — Encounter: Payer: Self-pay | Admitting: General Surgery

## 2015-01-27 ENCOUNTER — Encounter: Payer: Self-pay | Admitting: General Surgery

## 2015-01-27 ENCOUNTER — Ambulatory Visit (INDEPENDENT_AMBULATORY_CARE_PROVIDER_SITE_OTHER): Payer: Medicare Other | Admitting: General Surgery

## 2015-01-27 VITALS — BP 160/74 | HR 72 | Resp 16 | Ht 68.0 in | Wt 206.0 lb

## 2015-01-27 DIAGNOSIS — D0512 Intraductal carcinoma in situ of left breast: Secondary | ICD-10-CM

## 2015-01-27 NOTE — Patient Instructions (Addendum)
Continue self breast exams. Call office for any new breast issues or concerns. The patient has been asked to return to the office in one year with a bilateral diagnostic mammogram. 

## 2015-01-27 NOTE — Progress Notes (Addendum)
Patient ID: Eileen Flores, female   DOB: 08-Jul-1935, 79 y.o.   MRN: [id number]  Chief Complaint  Patient presents with  . Follow-up    HPI Eileen Flores is a 79 y.o. female.  who presents for her breast cancer follow up and a breast evaluation. The most recent mammogram was done on 01-16-15.  Patient does perform regular self breast checks and gets regular mammograms done.   No new breast issues. I personally reviewed the patient's history. HPI  Past Medical History  Diagnosis Date  . Cystitis 2008  . Arthritis 1980  . Mammographic microcalcification   . Bowel trouble 1970  . Allergy   . IBS (irritable bowel syndrome)     since 1970's  . Hyperlipidemia   . Cancer [facility]) 2014    Left breast papillary DCIS. ER 90%; PR 90%  . Malignant neoplasm of upper-outer quadrant of female breast [facility]) December 2014    Left breast papillary DCIS. ER 90%; PR 90%; MammoSite January 2015, declined antiestrogen therapy    Past Surgical History  Procedure Laterality Date  . Ovarian cyst removal  1967  . [facility]  . Colonoscopy  2011    Dr. Bary Castilla  . Cataract surgery Bilateral 2009  . Polypectomy  2006  . Thyroidectomy  2006  . Tongue surgery  2009  . Breast surgery Left Oct 2012    benign stereo biopsy  . Breast biopsy Left 2014    stereo biopsy  . Breast surgery Right 01-14-13    excision breast mass, intraductal papilloma  . Breast surgery Left 01-14-13    Excision intra-papillary carcinoma, DCIS, ER 90%, PR 90%.    Family History  Problem Relation Age of Onset  . Colon cancer    . Colon polyps    . Cancer Mother 49    liver cancer  . Cancer Father 48    liver cancer  . Alcohol abuse Father     Social History Social History  Substance Use Topics  . Smoking status: Never Smoker   . Smokeless tobacco: Never Used  . Alcohol Use: No    Allergies  Allergen Reactions  . Penicillin G Rash  . Penicillins Rash    Current Outpatient Prescriptions  Medication Sig  Dispense Refill  . levothyroxine (SYNTHROID, LEVOTHROID) 100 MCG tablet Take 100 mcg by mouth daily before breakfast.    . timolol (TIMOPTIC) 0.5 % ophthalmic solution      No current facility-administered medications for this visit.    Review of Systems Review of Systems  Constitutional: Negative.   Respiratory: Negative.   Cardiovascular: Negative.     Blood pressure 160/74, pulse 72, resp. rate 16, height 5\' 8"  (1.727 m), weight 206 lb (93.441 kg).  Physical Exam Physical Exam  Constitutional: She is oriented to person, place, and time. She appears well-developed and well-nourished.  HENT:  Mouth/Throat: Oropharynx is clear and moist.  Eyes: Conjunctivae are normal. No scleral icterus.  Neck: Neck supple.  Cardiovascular: Normal rate and regular rhythm.   Murmur heard. A stable murmur is appreciated at the upper right sternal border, best appreciated during normal respiration in the supine position. No radiation to the carotids.  Pulmonary/Chest: Effort normal and breath sounds normal. Right breast exhibits no inverted nipple, no mass, no nipple discharge, no skin change and no tenderness. Left breast exhibits no inverted nipple, no mass, no nipple discharge, no skin change and no tenderness.    Right breast 9 o'clock radial scar  5 mm wide.  Abdominal: Soft.  Lymphadenopathy:    She has no cervical adenopathy.    She has no axillary adenopathy.  Neurological: She is alert and oriented to person, place, and time.  Skin: Skin is warm and dry.  Psychiatric: Her behavior is normal.    Data Reviewed Bilateral diagnostic mammogram dated 01/16/2015 completed at [facility] showed postsurgical changes. Stable lymph node in the right axilla.BI-RADS-2.  Assessment    No evidence of recurrent disease. Status post wide excision, accelerated partial breast radiation for DCIS/LCIS associated with radial scar of the left breast.    Plan        The patient has been asked to  return to the office in one year with a bilateral diagnostic mammogram.       PCP:  Margarita Rana This information has been scribed by Karie Fetch RNBC.   Robert Bellow 01/27/2015, 10:53 AM

## 2015-04-14 ENCOUNTER — Encounter: Payer: Self-pay | Admitting: *Deleted

## 2015-06-03 DIAGNOSIS — E890 Postprocedural hypothyroidism: Secondary | ICD-10-CM | POA: Diagnosis not present

## 2015-06-09 DIAGNOSIS — E890 Postprocedural hypothyroidism: Secondary | ICD-10-CM | POA: Diagnosis not present

## 2015-06-09 DIAGNOSIS — H6123 Impacted cerumen, bilateral: Secondary | ICD-10-CM | POA: Diagnosis not present

## 2015-06-25 DIAGNOSIS — H40003 Preglaucoma, unspecified, bilateral: Secondary | ICD-10-CM | POA: Diagnosis not present

## 2015-11-16 ENCOUNTER — Ambulatory Visit (INDEPENDENT_AMBULATORY_CARE_PROVIDER_SITE_OTHER): Payer: Medicare Other | Admitting: Family Medicine

## 2015-11-16 ENCOUNTER — Ambulatory Visit
Admission: RE | Admit: 2015-11-16 | Discharge: 2015-11-16 | Disposition: A | Discharge status: Home / Self Care | Payer: Medicare Other | Source: Ambulatory Visit | Attending: Family Medicine | Admitting: Family Medicine

## 2015-11-16 ENCOUNTER — Encounter: Payer: Self-pay | Admitting: Family Medicine

## 2015-11-16 VITALS — BP 122/82 | HR 76 | Temp 98.3°F | Resp 16 | Wt 207.4 lb

## 2015-11-16 DIAGNOSIS — M1711 Unilateral primary osteoarthritis, right knee: Secondary | ICD-10-CM | POA: Diagnosis not present

## 2015-11-16 DIAGNOSIS — M719 Bursopathy, unspecified: Secondary | ICD-10-CM | POA: Insufficient documentation

## 2015-11-16 DIAGNOSIS — M25461 Effusion, right knee: Secondary | ICD-10-CM | POA: Insufficient documentation

## 2015-11-16 DIAGNOSIS — E039 Hypothyroidism, unspecified: Secondary | ICD-10-CM | POA: Insufficient documentation

## 2015-11-16 DIAGNOSIS — K5790 Diverticulosis of intestine, part unspecified, without perforation or abscess without bleeding: Secondary | ICD-10-CM | POA: Insufficient documentation

## 2015-11-16 DIAGNOSIS — M25561 Pain in right knee: Secondary | ICD-10-CM | POA: Diagnosis not present

## 2015-11-16 DIAGNOSIS — K635 Polyp of colon: Secondary | ICD-10-CM | POA: Insufficient documentation

## 2015-11-16 DIAGNOSIS — M2040 Other hammer toe(s) (acquired), unspecified foot: Secondary | ICD-10-CM | POA: Insufficient documentation

## 2015-11-16 DIAGNOSIS — F419 Anxiety disorder, unspecified: Secondary | ICD-10-CM | POA: Insufficient documentation

## 2015-11-16 DIAGNOSIS — M1990 Unspecified osteoarthritis, unspecified site: Secondary | ICD-10-CM | POA: Insufficient documentation

## 2015-11-16 DIAGNOSIS — M21619 Bunion of unspecified foot: Secondary | ICD-10-CM | POA: Insufficient documentation

## 2015-11-16 DIAGNOSIS — M779 Enthesopathy, unspecified: Secondary | ICD-10-CM | POA: Insufficient documentation

## 2015-11-16 DIAGNOSIS — M7989 Other specified soft tissue disorders: Secondary | ICD-10-CM | POA: Diagnosis not present

## 2015-11-16 MED ORDER — MELOXICAM 15 MG PO TABS: 15.0000 mg | ORAL_TABLET | Freq: Every day | ORAL | 0 refills | Status: DC

## 2015-11-16 NOTE — Progress Notes (Signed)
Patient: Eileen Flores Female    DOB: 08-Jan-1936   80 y.o.   MRN: [id number] Visit Date: 11/16/2015  Today's Provider: Vernie Murders, PA   Chief Complaint  Patient presents with  . Leg Pain   Subjective:    Leg Pain   Incident onset: Friday 11/13/2015. There was no injury mechanism. The pain is present in the right leg and right knee. The quality of the pain is described as aching. The pain has been constant (but gradually improving) since onset. The symptoms are aggravated by movement and weight bearing. She has tried nothing for the symptoms.   Past Medical History:  Diagnosis Date  . Allergy   . Arthritis 1980  . Bowel trouble 1970  . Cancer [facility] 2014   Left breast papillary DCIS. ER 90%; PR 90%  . Cystitis 2008  . Hyperlipidemia   . IBS (irritable bowel syndrome)    since 1970's  . Malignant neoplasm of upper-outer quadrant of female breast [facility]) December 2014   Left breast papillary DCIS. ER 90%; PR 90%; MammoSite January 2015, declined antiestrogen therapy  . Mammographic microcalcification    Past Surgical History:  Procedure Laterality Date  . BREAST BIOPSY Left 2014   stereo biopsy  . BREAST SURGERY Left Oct 2012   benign stereo biopsy  . BREAST SURGERY Right 01-14-13   excision breast mass, intraductal papilloma  . BREAST SURGERY Left 01-14-13   Excision intra-papillary carcinoma, DCIS, ER 90%, PR 90%.  . cataract surgery Bilateral 2009  . COLONOSCOPY  2011   Dr. Bary Castilla  . dermbrasion   1965  . OVARIAN CYST REMOVAL  1967  . POLYPECTOMY  2006  . THYROIDECTOMY  2006  . TONGUE SURGERY  2009   Family History  Problem Relation Age of Onset  . Colon cancer    . Colon polyps    . Cancer Mother 55    liver cancer  . Cancer Father 28    liver cancer  . Alcohol abuse Father    Allergies  Allergen Reactions  . Penicillin G Rash  . Penicillins Rash     Previous Medications   LEVOTHYROXINE (SYNTHROID, LEVOTHROID) 100 MCG TABLET    Take 100 mcg by mouth  daily before breakfast.   TIMOLOL (TIMOPTIC) 0.5 % OPHTHALMIC SOLUTION        Review of Systems  Constitutional: Negative.   Respiratory: Negative.   Cardiovascular: Negative.   Musculoskeletal: Positive for myalgias.    Social History  Substance Use Topics  . Smoking status: Never Smoker  . Smokeless tobacco: Never Used  . Alcohol use No   Objective:   BP 122/82 (BP Location: Right Arm, Patient Position: Sitting, Cuff Size: Normal)   Pulse 76   Temp 98.3 F (36.8 C) (Oral)   Resp 16   Wt 207 lb 6.4 oz (94.1 kg)   BMI 31.54 kg/m   Physical Exam  Constitutional: She appears well-developed and well-nourished.  HENT:  Head: Normocephalic.  Eyes: Conjunctivae are normal.  Neck: Neck supple.  Cardiovascular: Normal rate and regular rhythm.       Assessment & Plan:     1. Right knee pain, unspecified chronicity Onset over the past 3-5 days. No known injury. Described as an aching pain with stiffness in the right knee. Using a walker for ambulation support. Will give Meloxicam and get x-ray evaluation to the right knee. Recheck prn. May need orthopedic referral if no improvement in 2 weeks. - DG Knee Complete 4  Views Right - meloxicam (MOBIC) 15 MG tablet; Take 1 tablet (15 mg total) by mouth daily.  Dispense: 30 tablet; Refill: 0

## 2015-11-16 NOTE — Patient Instructions (Signed)

## 2015-11-18 ENCOUNTER — Telehealth: Payer: Self-pay

## 2015-11-18 NOTE — Telephone Encounter (Signed)
-----   Message from Margo Common, [place] sent at 11/17/2015  6:21 PM EDT ----- X-ray confirms tricompartmental osteoarthritis. If no better with use of Meloxicam, should consider referral to an orthopedist for possible cortisone injection and/or physical therapy.

## 2015-11-18 NOTE — Telephone Encounter (Signed)
Patient advised.

## 2015-11-25 ENCOUNTER — Telehealth: Payer: Self-pay

## 2015-11-25 NOTE — Telephone Encounter (Signed)
Patient will now have her mammograms at [facility].

## 2015-12-02 ENCOUNTER — Other Ambulatory Visit: Payer: Self-pay

## 2015-12-02 DIAGNOSIS — D0512 Intraductal carcinoma in situ of left breast: Secondary | ICD-10-CM

## 2015-12-09 ENCOUNTER — Ambulatory Visit
Admission: RE | Admit: 2015-12-09 | Discharge: 2015-12-09 | Disposition: A | Discharge status: Home / Self Care | Payer: Self-pay | Source: Ambulatory Visit | Attending: *Deleted | Admitting: *Deleted

## 2015-12-09 ENCOUNTER — Other Ambulatory Visit: Payer: Self-pay | Admitting: *Deleted

## 2015-12-09 DIAGNOSIS — Z9289 Personal history of other medical treatment: Secondary | ICD-10-CM

## 2015-12-09 DIAGNOSIS — Z23 Encounter for immunization: Secondary | ICD-10-CM | POA: Diagnosis not present

## 2015-12-22 DIAGNOSIS — H40003 Preglaucoma, unspecified, bilateral: Secondary | ICD-10-CM | POA: Diagnosis not present

## 2015-12-29 DIAGNOSIS — H40003 Preglaucoma, unspecified, bilateral: Secondary | ICD-10-CM | POA: Diagnosis not present

## 2016-01-18 ENCOUNTER — Encounter: Payer: Self-pay | Admitting: *Deleted

## 2016-01-18 ENCOUNTER — Other Ambulatory Visit: Payer: Medicare Other

## 2016-01-18 ENCOUNTER — Ambulatory Visit: Payer: Medicare Other

## 2016-01-19 ENCOUNTER — Ambulatory Visit: Payer: Medicare Other | Admitting: General Surgery

## 2016-01-19 ENCOUNTER — Ambulatory Visit
Admission: RE | Admit: 2016-01-19 | Discharge: 2016-01-19 | Disposition: A | Discharge status: Home / Self Care | Payer: Medicare Other | Source: Ambulatory Visit | Attending: General Surgery | Admitting: General Surgery

## 2016-01-19 ENCOUNTER — Other Ambulatory Visit: Payer: Self-pay | Admitting: General Surgery

## 2016-01-19 DIAGNOSIS — D0512 Intraductal carcinoma in situ of left breast: Secondary | ICD-10-CM

## 2016-01-19 DIAGNOSIS — R928 Other abnormal and inconclusive findings on diagnostic imaging of breast: Secondary | ICD-10-CM | POA: Diagnosis not present

## 2016-01-21 ENCOUNTER — Encounter: Payer: Self-pay | Admitting: General Surgery

## 2016-01-21 ENCOUNTER — Ambulatory Visit (INDEPENDENT_AMBULATORY_CARE_PROVIDER_SITE_OTHER): Payer: Medicare Other | Admitting: General Surgery

## 2016-01-21 VITALS — BP 152/80 | HR 70 | Resp 12 | Ht 68.0 in | Wt 205.0 lb

## 2016-01-21 DIAGNOSIS — C50912 Malignant neoplasm of unspecified site of left female breast: Secondary | ICD-10-CM | POA: Diagnosis not present

## 2016-01-21 NOTE — Progress Notes (Signed)
Patient ID: Eileen Flores, female   DOB: 02-14-1935, 80 y.o.   MRN: [id number]  Chief Complaint  Patient presents with  . Follow-up    HPI Eileen Flores is a 80 y.o. female who presents for a breast evaluation. The most recent mammogram was done on 01/19/2016 .  Patient does perform regular self breast checks and gets regular mammograms done.  Patient Inclined antiestrogen therapy.  HPI  Past Medical History:  Diagnosis Date  . Allergy   . Arthritis 1980  . Bowel trouble 1970  . Cancer [facility]) 2014   Left breast papillary DCIS. ER 90%; PR 90%  . Cystitis 2008  . Hyperlipidemia   . IBS (irritable bowel syndrome)    since 1970's  . Intraductal papilloma of breast, right 01/2013   Right  . Malignant neoplasm of upper-outer quadrant of female breast [facility]) December 2014   Left breast papillary DCIS. ER 90%; PR 90%; MammoSite January 2015, declined antiestrogen therapy  . Mammographic microcalcification     Past Surgical History:  Procedure Laterality Date  . BREAST BIOPSY Left 2014   stereo biopsy  . BREAST EXCISIONAL BIOPSY Right 2014  . BREAST SURGERY Left Oct 2012   benign stereo biopsy  . BREAST SURGERY Right 01-14-13   excision breast mass, intraductal papilloma  . BREAST SURGERY Left 01-14-13   Excision intra-papillary carcinoma, DCIS, ER 90%, PR 90%.  . cataract surgery Bilateral 2009  . COLONOSCOPY  2011   Dr. Bary Castilla  . dermbrasion   1965  . OVARIAN CYST REMOVAL  1967  . POLYPECTOMY  2006  . THYROIDECTOMY  2006  . TONGUE SURGERY  2009    Family History  Problem Relation Age of Onset  . Colon cancer    . Colon polyps    . Cancer Mother 38    liver cancer  . Cancer Father 13    liver cancer  . Alcohol abuse Father   . Breast cancer Neg Hx     Social History Social History  Substance Use Topics  . Smoking status: Never Smoker  . Smokeless tobacco: Never Used  . Alcohol use No    Allergies  Allergen Reactions  . Penicillin G Rash  . Penicillins  Rash    Current Outpatient Prescriptions  Medication Sig Dispense Refill  . levothyroxine (SYNTHROID, LEVOTHROID) 100 MCG tablet Take 100 mcg by mouth daily before breakfast.    . meloxicam (MOBIC) 15 MG tablet Take 1 tablet (15 mg total) by mouth daily. 30 tablet 0  . timolol (TIMOPTIC) 0.5 % ophthalmic solution      No current facility-administered medications for this visit.     Review of Systems Review of Systems  Constitutional: Negative.   Respiratory: Negative.   Cardiovascular: Negative.     Blood pressure (!) 152/80, pulse 70, resp. rate 12, height 5\' 8"  (1.727 m), weight 205 lb (93 kg).  Physical Exam Physical Exam  Constitutional: She is oriented to person, place, and time. She appears well-developed and well-nourished.  Eyes: Conjunctivae are normal. No scleral icterus.  Neck: Neck supple.  Cardiovascular: Normal rate, regular rhythm and normal heart sounds.   Pulmonary/Chest: Effort normal and breath sounds normal. Right breast exhibits no inverted nipple, no mass, no nipple discharge, no skin change and no tenderness. Left breast exhibits no inverted nipple, no mass, no nipple discharge, no skin change and no tenderness.    Abdominal: Bowel sounds are normal.  Lymphadenopathy:    She has no cervical adenopathy.  She has no axillary adenopathy.  Neurological: She is alert and oriented to person, place, and time.  Skin: Skin is warm and dry.    Data Reviewed Bilateral mammograms dated 01/19/2016 were reviewed. No interval change. BI-RADS-2.  Assessment    Benign breast exam.    Plan         Patient will be asked to return to the office in one year with a bilateral diagnotic mammogram. This information has been scribed by Gaspar Cola CMA.   Robert Bellow 01/22/2016, 7:05 PM

## 2016-01-21 NOTE — Patient Instructions (Signed)
Patient will be asked to return to the office in one year with a bilateral diagnotic  mammogram. 

## 2016-01-22 ENCOUNTER — Encounter: Payer: Self-pay | Admitting: General Surgery

## 2016-05-05 ENCOUNTER — Ambulatory Visit (INDEPENDENT_AMBULATORY_CARE_PROVIDER_SITE_OTHER): Payer: Medicare Other | Admitting: Physician Assistant

## 2016-05-05 ENCOUNTER — Ambulatory Visit: Payer: Medicare Other

## 2016-05-05 ENCOUNTER — Encounter: Payer: Self-pay | Admitting: Physician Assistant

## 2016-05-05 VITALS — BP 156/82 | HR 60 | Temp 98.1°F | Ht 68.0 in | Wt 206.4 lb

## 2016-05-05 DIAGNOSIS — Z0000 Encounter for general adult medical examination without abnormal findings: Secondary | ICD-10-CM

## 2016-05-05 DIAGNOSIS — Z780 Asymptomatic menopausal state: Secondary | ICD-10-CM

## 2016-05-05 DIAGNOSIS — Z13220 Encounter for screening for lipoid disorders: Secondary | ICD-10-CM

## 2016-05-05 DIAGNOSIS — K219 Gastro-esophageal reflux disease without esophagitis: Secondary | ICD-10-CM | POA: Diagnosis not present

## 2016-05-05 DIAGNOSIS — Z136 Encounter for screening for cardiovascular disorders: Secondary | ICD-10-CM

## 2016-05-05 DIAGNOSIS — E039 Hypothyroidism, unspecified: Secondary | ICD-10-CM

## 2016-05-05 NOTE — Patient Instructions (Signed)
Health Maintenance for Postmenopausal Women Menopause is a normal process in which your reproductive ability comes to an end. This process happens gradually over a span of months to years, usually between the ages of 33 and 38. Menopause is complete when you have missed 12 consecutive menstrual periods. It is important to talk with your health care provider about some of the most common conditions that affect postmenopausal women, such as heart disease, cancer, and bone loss (osteoporosis). Adopting a healthy lifestyle and getting preventive care can help to promote your health and wellness. Those actions can also lower your chances of developing some of these common conditions. What should I know about menopause? During menopause, you may experience a number of symptoms, such as:  Moderate-to-severe hot flashes.  Night sweats.  Decrease in sex drive.  Mood swings.  Headaches.  Tiredness.  Irritability.  Memory problems.  Insomnia. Choosing to treat or not to treat menopausal changes is an individual decision that you make with your health care provider. What should I know about hormone replacement therapy and supplements? Hormone therapy products are effective for treating symptoms that are associated with menopause, such as hot flashes and night sweats. Hormone replacement carries certain risks, especially as you become older. If you are thinking about using estrogen or estrogen with progestin treatments, discuss the benefits and risks with your health care provider. What should I know about heart disease and stroke? Heart disease, heart attack, and stroke become more likely as you age. This may be due, in part, to the hormonal changes that your body experiences during menopause. These can affect how your body processes dietary fats, triglycerides, and cholesterol. Heart attack and stroke are both medical emergencies. There are many things that you can do to help prevent heart disease  and stroke:  Have your blood pressure checked at least every 1-2 years. High blood pressure causes heart disease and increases the risk of stroke.  If you are 48-61 years old, ask your health care provider if you should take aspirin to prevent a heart attack or a stroke.  Do not use any tobacco products, including cigarettes, chewing tobacco, or electronic cigarettes. If you need help quitting, ask your health care provider.  It is important to eat a healthy diet and maintain a healthy weight.  Be sure to include plenty of vegetables, fruits, low-fat dairy products, and lean protein.  Avoid eating foods that are high in solid fats, added sugars, or salt (sodium).  Get regular exercise. This is one of the most important things that you can do for your health.  Try to exercise for at least 150 minutes each week. The type of exercise that you do should increase your heart rate and make you sweat. This is known as moderate-intensity exercise.  Try to do strengthening exercises at least twice each week. Do these in addition to the moderate-intensity exercise.  Know your numbers.Ask your health care provider to check your cholesterol and your blood glucose. Continue to have your blood tested as directed by your health care provider. What should I know about cancer screening? There are several types of cancer. Take the following steps to reduce your risk and to catch any cancer development as early as possible. Breast Cancer  Practice breast self-awareness.  This means understanding how your breasts normally appear and feel.  It also means doing regular breast self-exams. Let your health care provider know about any changes, no matter how small.  If you are 40 or older,  have a clinician do a breast exam (clinical breast exam or CBE) every year. Depending on your age, family history, and medical history, it may be recommended that you also have a yearly breast X-ray (mammogram).  If you  have a family history of breast cancer, talk with your health care provider about genetic screening.  If you are at high risk for breast cancer, talk with your health care provider about having an MRI and a mammogram every year.  Breast cancer (BRCA) gene test is recommended for women who have family members with BRCA-related cancers. Results of the assessment will determine the need for genetic counseling and BRCA1 and for BRCA2 testing. BRCA-related cancers include these types:  Breast. This occurs in males or females.  Ovarian.  Tubal. This may also be called fallopian tube cancer.  Cancer of the abdominal or pelvic lining (peritoneal cancer).  Prostate.  Pancreatic. Cervical, Uterine, and Ovarian Cancer  Your health care provider may recommend that you be screened regularly for cancer of the pelvic organs. These include your ovaries, uterus, and vagina. This screening involves a pelvic exam, which includes checking for microscopic changes to the surface of your cervix (Pap test).  For women ages 21-65, health care providers may recommend a pelvic exam and a Pap test every three years. For women ages 23-65, they may recommend the Pap test and pelvic exam, combined with testing for human papilloma virus (HPV), every five years. Some types of HPV increase your risk of cervical cancer. Testing for HPV may also be done on women of any age who have unclear Pap test results.  Other health care providers may not recommend any screening for nonpregnant women who are considered low risk for pelvic cancer and have no symptoms. Ask your health care provider if a screening pelvic exam is right for you.  If you have had past treatment for cervical cancer or a condition that could lead to cancer, you need Pap tests and screening for cancer for at least 20 years after your treatment. If Pap tests have been discontinued for you, your risk factors (such as having a new sexual partner) need to be reassessed  to determine if you should start having screenings again. Some women have medical problems that increase the chance of getting cervical cancer. In these cases, your health care provider may recommend that you have screening and Pap tests more often.  If you have a family history of uterine cancer or ovarian cancer, talk with your health care provider about genetic screening.  If you have vaginal bleeding after reaching menopause, tell your health care provider.  There are currently no reliable tests available to screen for ovarian cancer. Lung Cancer  Lung cancer screening is recommended for adults 99-83 years old who are at high risk for lung cancer because of a history of smoking. A yearly low-dose CT scan of the lungs is recommended if you:  Currently smoke.  Have a history of at least 30 pack-years of smoking and you currently smoke or have quit within the past 15 years. A pack-year is smoking an average of one pack of cigarettes per day for one year. Yearly screening should:  Continue until it has been 15 years since you quit.  Stop if you develop a health problem that would prevent you from having lung cancer treatment. Colorectal Cancer  This type of cancer can be detected and can often be prevented.  Routine colorectal cancer screening usually begins at age 72 and continues  through age 75.  If you have risk factors for colon cancer, your health care provider may recommend that you be screened at an earlier age.  If you have a family history of colorectal cancer, talk with your health care provider about genetic screening.  Your health care provider may also recommend using home test kits to check for hidden blood in your stool.  A small camera at the end of a tube can be used to examine your colon directly (sigmoidoscopy or colonoscopy). This is done to check for the earliest forms of colorectal cancer.  Direct examination of the colon should be repeated every 5-10 years until  age 75. However, if early forms of precancerous polyps or small growths are found or if you have a family history or genetic risk for colorectal cancer, you may need to be screened more often. Skin Cancer  Check your skin from head to toe regularly.  Monitor any moles. Be sure to tell your health care provider:  About any new moles or changes in moles, especially if there is a change in a mole's shape or color.  If you have a mole that is larger than the size of a pencil eraser.  If any of your family members has a history of skin cancer, especially at a young age, talk with your health care provider about genetic screening.  Always use sunscreen. Apply sunscreen liberally and repeatedly throughout the day.  Whenever you are outside, protect yourself by wearing long sleeves, pants, a wide-brimmed hat, and sunglasses. What should I know about osteoporosis? Osteoporosis is a condition in which bone destruction happens more quickly than new bone creation. After menopause, you may be at an increased risk for osteoporosis. To help prevent osteoporosis or the bone fractures that can happen because of osteoporosis, the following is recommended:  If you are 19-50 years old, get at least 1,000 mg of calcium and at least 600 mg of vitamin D per day.  If you are older than age 50 but younger than age 70, get at least 1,200 mg of calcium and at least 600 mg of vitamin D per day.  If you are older than age 70, get at least 1,200 mg of calcium and at least 800 mg of vitamin D per day. Smoking and excessive alcohol intake increase the risk of osteoporosis. Eat foods that are rich in calcium and vitamin D, and do weight-bearing exercises several times each week as directed by your health care provider. What should I know about how menopause affects my mental health? Depression may occur at any age, but it is more common as you become older. Common symptoms of depression include:  Low or sad  mood.  Changes in sleep patterns.  Changes in appetite or eating patterns.  Feeling an overall lack of motivation or enjoyment of activities that you previously enjoyed.  Frequent crying spells. Talk with your health care provider if you think that you are experiencing depression. What should I know about immunizations? It is important that you get and maintain your immunizations. These include:  Tetanus, diphtheria, and pertussis (Tdap) booster vaccine.  Influenza every year before the flu season begins.  Pneumonia vaccine.  Shingles vaccine. Your health care provider may also recommend other immunizations. This information is not intended to replace advice given to you by your health care provider. Make sure you discuss any questions you have with your health care provider. Document Released: 03/18/2005 Document Revised: 08/14/2015 Document Reviewed: 10/28/2014 Elsevier Interactive Patient   Education  2017 [employer]  

## 2016-05-05 NOTE — Patient Instructions (Addendum)
Eileen Flores , Thank you for taking time to come for your Medicare Wellness Visit. I appreciate your ongoing commitment to your health goals. Please review the following plan we discussed and let me know if I can assist you in the future.   Screening recommendations/referrals: Colonoscopy: done 06/09/09 Mammogram: done 01/19/16 Bone Density: order placed today Recommended yearly ophthalmology/optometry visit for glaucoma screening and checkup Recommended yearly dental visit for hygiene and checkup  Vaccinations: Influenza vaccine: done in 2017 Pneumococcal vaccine: declined today Tdap vaccine: declined   Advanced directives: Packet given to complete, patient to bring copy once finished  Next appointment: None   Preventive Care 79 Years and Older, Female Preventive care refers to lifestyle choices and visits with your health care provider that can promote health and wellness. What does preventive care include?  A yearly physical exam. This is also called an annual well check.  Dental exams once or twice a year.  Routine eye exams. Ask your health care provider how often you should have your eyes checked.  Personal lifestyle choices, including:  Daily care of your teeth and gums.  Regular physical activity.  Eating a healthy diet.  Avoiding tobacco and drug use.  Limiting alcohol use.  Practicing safe sex.  Taking low-dose aspirin every day.  Taking vitamin and mineral supplements as recommended by your health care provider. What happens during an annual well check? The services and screenings done by your health care provider during your annual well check will depend on your age, overall health, lifestyle risk factors, and family history of disease. Counseling  Your health care provider may ask you questions about your:  Alcohol use.  Tobacco use.  Drug use.  Emotional well-being.  Home and relationship well-being.  Sexual activity.  Eating  habits.  History of falls.  Memory and ability to understand (cognition).  Work and work Statistician.  Reproductive health. Screening  You may have the following tests or measurements:  Height, weight, and BMI.  Blood pressure.  Lipid and cholesterol levels. These may be checked every 5 years, or more frequently if you are over 5 years old.  Skin check.  Lung cancer screening. You may have this screening every year starting at age 64 if you have a 30-pack-year history of smoking and currently smoke or have quit within the past 15 years.  Fecal occult blood test (FOBT) of the stool. You may have this test every year starting at age 59.  Flexible sigmoidoscopy or colonoscopy. You may have a sigmoidoscopy every 5 years or a colonoscopy every 10 years starting at age 79.  Hepatitis C blood test.  Hepatitis B blood test.  Sexually transmitted disease (STD) testing.  Diabetes screening. This is done by checking your blood sugar (glucose) after you have not eaten for a while (fasting). You may have this done every 1-3 years.  Bone density scan. This is done to screen for osteoporosis. You may have this done starting at age 69.  Mammogram. This may be done every 1-2 years. Talk to your health care provider about how often you should have regular mammograms. Talk with your health care provider about your test results, treatment options, and if necessary, the need for more tests. Vaccines  Your health care provider may recommend certain vaccines, such as:  Influenza vaccine. This is recommended every year.  Tetanus, diphtheria, and acellular pertussis (Tdap, Td) vaccine. You may need a Td booster every 10 years.  Zoster vaccine. You may need this after age  60.  Pneumococcal 13-valent conjugate (PCV13) vaccine. One dose is recommended after age 24.  Pneumococcal polysaccharide (PPSV23) vaccine. One dose is recommended after age 66. Talk to your health care provider about which  screenings and vaccines you need and how often you need them. This information is not intended to replace advice given to you by your health care provider. Make sure you discuss any questions you have with your health care provider. Document Released: 02/20/2015 Document Revised: 10/14/2015 Document Reviewed: 11/25/2014 Elsevier Interactive Patient Education  2017 [place] Prevention in the Home Falls can cause injuries. They can happen to people of all ages. There are many things you can do to make your home safe and to help prevent falls. What can I do on the outside of my home?  Regularly fix the edges of walkways and driveways and fix any cracks.  Remove anything that might make you trip as you walk through a door, such as a raised step or threshold.  Trim any bushes or trees on the path to your home.  Use bright outdoor lighting.  Clear any walking paths of anything that might make someone trip, such as rocks or tools.  Regularly check to see if handrails are loose or broken. Make sure that both sides of any steps have handrails.  Any raised decks and porches should have guardrails on the edges.  Have any leaves, snow, or ice cleared regularly.  Use sand or salt on walking paths during winter.  Clean up any spills in your garage right away. This includes oil or grease spills. What can I do in the bathroom?  Use night lights.  Install grab bars by the toilet and in the tub and shower. Do not use towel bars as grab bars.  Use non-skid mats or decals in the tub or shower.  If you need to sit down in the shower, use a plastic, non-slip stool.  Keep the floor dry. Clean up any water that spills on the floor as soon as it happens.  Remove soap buildup in the tub or shower regularly.  Attach bath mats securely with double-sided non-slip rug tape.  Do not have throw rugs and other things on the floor that can make you trip. What can I do in the bedroom?  Use  night lights.  Make sure that you have a light by your bed that is easy to reach.  Do not use any sheets or blankets that are too big for your bed. They should not hang down onto the floor.  Have a firm chair that has side arms. You can use this for support while you get dressed.  Do not have throw rugs and other things on the floor that can make you trip. What can I do in the kitchen?  Clean up any spills right away.  Avoid walking on wet floors.  Keep items that you use a lot in easy-to-reach places.  If you need to reach something above you, use a strong step stool that has a grab bar.  Keep electrical cords out of the way.  Do not use floor polish or wax that makes floors slippery. If you must use wax, use non-skid floor wax.  Do not have throw rugs and other things on the floor that can make you trip. What can I do with my stairs?  Do not leave any items on the stairs.  Make sure that there are handrails on both sides of the stairs and  use them. Fix handrails that are broken or loose. Make sure that handrails are as long as the stairways.  Check any carpeting to make sure that it is firmly attached to the stairs. Fix any carpet that is loose or worn.  Avoid having throw rugs at the top or bottom of the stairs. If you do have throw rugs, attach them to the floor with carpet tape.  Make sure that you have a light switch at the top of the stairs and the bottom of the stairs. If you do not have them, ask someone to add them for you. What else can I do to help prevent falls?  Wear shoes that:  Do not have high heels.  Have rubber bottoms.  Are comfortable and fit you well.  Are closed at the toe. Do not wear sandals.  If you use a stepladder:  Make sure that it is fully opened. Do not climb a closed stepladder.  Make sure that both sides of the stepladder are locked into place.  Ask someone to hold it for you, if possible.  Clearly mark and make sure that you  can see:  Any grab bars or handrails.  First and last steps.  Where the edge of each step is.  Use tools that help you move around (mobility aids) if they are needed. These include:  Canes.  Walkers.  Scooters.  Crutches.  Turn on the lights when you go into a dark area. Replace any light bulbs as soon as they burn out.  Set up your furniture so you have a clear path. Avoid moving your furniture around.  If any of your floors are uneven, fix them.  If there are any pets around you, be aware of where they are.  Review your medicines with your doctor. Some medicines can make you feel dizzy. This can increase your chance of falling. Ask your doctor what other things that you can do to help prevent falls. This information is not intended to replace advice given to you by your health care provider. Make sure you discuss any questions you have with your health care provider. Document Released: 11/20/2008 Document Revised: 07/02/2015 Document Reviewed: 02/28/2014 Elsevier Interactive Patient Education  2017 [employer].

## 2016-05-05 NOTE — Progress Notes (Signed)
Subjective:   Eileen Flores is a 81 y.o. female who presents for Medicare Annual (Subsequent) preventive examination.  Review of Systems:  N/A Cardiac Risk Factors include: advanced age (>36men, >8 women);obesity (BMI >30kg/m2)     Objective:     Vitals: BP (!) 156/82 (BP Location: Right Arm)   Pulse 60   Temp 98.1 F (36.7 C) (Oral)   Ht 5\' 8"  (1.727 m)   Wt 206 lb 6.4 oz (93.6 kg)   BMI 31.38 kg/m   Body mass index is 31.38 kg/m.   Tobacco History  Smoking Status  . Former Smoker  . Types: Cigarettes  Smokeless Tobacco  . Never Used    Comment: quit in mid to late 20's     Counseling given: Not Answered   Past Medical History:  Diagnosis Date  . Allergy   . Arthritis 1980  . Bowel trouble 1970  . 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 Cystitis 2008  . Hyperlipidemia   . IBS (irritable bowel syndrome)    since 1970's  . Intraductal papilloma of breast, right 01/2013   Right  . 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 Mammographic microcalcification    Past Surgical History:  Procedure Laterality Date  . BREAST BIOPSY Left 2014   stereo biopsy  . BREAST EXCISIONAL BIOPSY Right 2014  . BREAST SURGERY Left Oct 2012   benign stereo biopsy  . BREAST SURGERY Right 01-14-13   excision breast mass, intraductal papilloma  . BREAST SURGERY Left 01-14-13   Excision intra-papillary carcinoma, DCIS, ER 90%, PR 90%.  . cataract surgery Bilateral 2009  . COLONOSCOPY  2011   Dr. Bary Castilla  . dermbrasion   1965  . OVARIAN CYST REMOVAL  1967  . POLYPECTOMY  2006  . THYROIDECTOMY  2006  . TONGUE SURGERY  2009   Family History  Problem Relation Age of Onset  . Colon cancer    . Colon polyps    . Cancer Mother 46    liver cancer  . Cancer Father 58    liver cancer  . Alcohol abuse Father     . Breast cancer Neg Hx    History  Sexual Activity  . Sexual activity: Not on file    Outpatient Encounter Prescriptions as of 05/05/2016  Medication Sig  . levothyroxine (SYNTHROID, LEVOTHROID) 100 MCG tablet Take 100 mcg by mouth daily before breakfast.  . meloxicam (MOBIC) 15 MG tablet Take 1 tablet (15 mg total) by mouth daily. (Patient taking differently: Take 15 mg by mouth daily. )  . timolol (TIMOPTIC) 0.5 % ophthalmic solution    No facility-administered encounter medications on file as of 05/05/2016.     Activities of Daily Living In your present state of health, do you have any difficulty performing the following activities: 05/05/2016  Hearing? N  Vision? N  Difficulty concentrating or making decisions? Y  Walking or climbing stairs? Y  Dressing or bathing? N  Doing errands, shopping? N  Preparing Food and eating ? N  Using the Toilet? N  In the past six months, have you accidently leaked urine? Y  Do you have problems with loss of bowel control? N  Managing your Medications? N  Managing your Finances? N  Housekeeping or managing your Housekeeping? N  Some recent data might  be hidden    Patient Care Team: Mar Daring, PA-C as PCP - General (Family Medicine) Robert Bellow, MD (General Surgery) Leandrew Koyanagi, MD as Referring Physician (Ophthalmology) Margaretha Sheffield, MD as Consulting Physician (Otolaryngology)    Assessment:    Exercise Activities and Dietary recommendations Current Exercise Habits: Home exercise routine, Type of exercise: stretching, Time (Minutes): 10, Intensity: Mild  Goals    . Increase water intake          Recommend increasing water intake to 4-5 glasses a day.      Fall Risk Fall Risk  05/05/2016  Falls in the past year? No   Depression Screen PHQ 2/9 Scores 05/05/2016 05/05/2016  PHQ - 2 Score 0 0  PHQ- 9 Score 4 -     Cognitive Function     6CIT Screen 05/05/2016  What Year? 0 points  What month? 0 points   What time? 0 points  Count back from 20 0 points  Months in reverse 0 points  Repeat phrase 0 points  Total Score 0    Immunization History  Administered Date(s) Administered  . Influenza, High Dose Seasonal PF 11/21/2013   Screening Tests Health Maintenance  Topic Date Due  . PNA vac Low Risk Adult (1 of 2 - PCV13) 04/07/2017 (Originally 09/16/2002)  . TETANUS/TDAP  02/07/2026 (Originally 09/15/1954)  . INFLUENZA VACCINE  Completed  . DEXA SCAN  Completed      Plan:  I have personally reviewed and addressed the Medicare Annual Wellness questionnaire and have noted the following in the patient's chart:  A. Medical and social history B. Use of alcohol, tobacco or illicit drugs  C. Current medications and supplements D. Functional ability and status E.  Nutritional status F.  Physical activity G. Advance directives H. List of other physicians I.  Hospitalizations, surgeries, and ER visits in previous 12 months J.  [place] such as hearing and vision if needed, cognitive and depression L. Referrals and appointments - none  In addition, I have reviewed and discussed with patient certain preventive protocols, quality metrics, and best practice recommendations. A written personalized care plan for preventive services as well as general preventive health recommendations were provided to patient.  See attached scanned questionnaire for additional information.   Signed,  Fabio Neighbors, LPN Nurse Health Advisor   MD Recommendations: None. Pt denied tetanus and pneumonia vaccines today. Pt is going to check her previous records to see if she has had the pneumonia vaccines and then let the office know.   I have reviewed the documentation and information obtained by Fabio Neighbors, LPN in the above chart and agree as above. I was available for consultation if any questions or issues arose.  Fenton Malling, PA-C

## 2016-05-05 NOTE — Progress Notes (Signed)
       Patient: Eileen Flores Female    DOB: [birth year]   81 y.o.   MRN: [id number] Visit Date: 05/05/2016  Today's Provider: Mar Daring, PA-C   Chief Complaint  Patient presents with  . Hypothyroidism   Subjective:    HPI  Hypothyroid, follow-up:  Thyroid Stimulating Horm  Date Value Ref Range Status  05/24/2012 0.987 uIU/mL Final    Comment:    0.45-4.50 (International Unit)  ----------------------- Pregnant patients have  different reference  ranges for TSH:  - - - - - - - - - -  Pregnant, first trimetser:  0.36 - 2.50 uIU/mL    Wt Readings from Last 3 Encounters:  05/05/16 206 lb 6.4 oz (93.6 kg)  01/21/16 205 lb (93 kg)  11/16/15 207 lb 6.4 oz (94.1 kg)    Management since that visit includes no changes. She reports excellent compliance with treatment. She is not having side effects.  She is not exercising. She is experiencing change in energy level She denies heat / cold intolerance Weight trend: stable ------------------------------------------------------------------------    Allergies  Allergen Reactions  . Penicillin G Rash  . Penicillins Rash     Current Outpatient Prescriptions:  .  levothyroxine (SYNTHROID, LEVOTHROID) 100 MCG tablet, Take 100 mcg by mouth daily before breakfast., Disp: , Rfl:  .  timolol (TIMOPTIC) 0.5 % ophthalmic solution, , Disp: , Rfl:   Review of Systems  Constitutional: Negative.   Respiratory: Negative.   Cardiovascular: Negative.   Endocrine: Negative.   Neurological: Negative.     Social History  Substance Use Topics  . Smoking status: Former Smoker    Types: Cigarettes  . Smokeless tobacco: Never Used     Comment: quit in mid to late 20's  . Alcohol use No   Objective:     BP    156/82 (BP Location: Right Arm)     Pulse  60     Temp  98.1 F (36.7 C) (Oral)     Ht  5\' 8"  (1.727 m)     Wt  206 lb 6.4 oz (93.6 kg)      BMI  31.38 kg/m      Physical Exam  Constitutional:  She appears well-developed and well-nourished. No distress.  Neck: Normal range of motion. Neck supple. No tracheal deviation present. No thyromegaly present.  Cardiovascular: Normal rate, regular rhythm and normal heart sounds.  Exam reveals no gallop and no friction rub.   No murmur heard. Pulmonary/Chest: Effort normal and breath sounds normal. No respiratory distress. She has no wheezes. She has no rales.  Lymphadenopathy:    She has no cervical adenopathy.  Skin: She is not diaphoretic.  Vitals reviewed.      Assessment & Plan:     1. Acquired hypothyroidism Stable on levothyroxine 171mcg. Will check labs as below and f/u pending results. - CBC w/Diff/Platelet - Comprehensive Metabolic Panel (CMET) - TSH  2. Encounter for lipid screening for cardiovascular disease Has not had lipids checked in over 5 years. Will check labs as below and f/u pending results. - Lipid Profile  3. Gastroesophageal reflux disease, esophagitis presence not specified Stable. Uses Tums prn. She is to call if symptoms worsen.       Mar Daring, PA-C  [facility]

## 2016-05-26 DIAGNOSIS — E890 Postprocedural hypothyroidism: Secondary | ICD-10-CM | POA: Diagnosis not present

## 2016-06-02 DIAGNOSIS — E890 Postprocedural hypothyroidism: Secondary | ICD-10-CM | POA: Diagnosis not present

## 2016-06-02 DIAGNOSIS — H6123 Impacted cerumen, bilateral: Secondary | ICD-10-CM | POA: Diagnosis not present

## 2016-06-13 ENCOUNTER — Ambulatory Visit: Payer: Medicare Other | Attending: Physician Assistant

## 2016-06-30 DIAGNOSIS — H40003 Preglaucoma, unspecified, bilateral: Secondary | ICD-10-CM | POA: Diagnosis not present

## 2016-11-05 ENCOUNTER — Ambulatory Visit (INDEPENDENT_AMBULATORY_CARE_PROVIDER_SITE_OTHER): Payer: Medicare Other

## 2016-11-05 DIAGNOSIS — Z23 Encounter for immunization: Secondary | ICD-10-CM | POA: Diagnosis not present

## 2016-11-29 ENCOUNTER — Other Ambulatory Visit: Payer: Self-pay

## 2016-11-29 DIAGNOSIS — D0512 Intraductal carcinoma in situ of left breast: Secondary | ICD-10-CM

## 2016-12-27 DIAGNOSIS — H40003 Preglaucoma, unspecified, bilateral: Secondary | ICD-10-CM | POA: Diagnosis not present

## 2017-01-03 DIAGNOSIS — H40003 Preglaucoma, unspecified, bilateral: Secondary | ICD-10-CM | POA: Diagnosis not present

## 2017-01-23 ENCOUNTER — Ambulatory Visit
Admission: RE | Admit: 2017-01-23 | Discharge: 2017-01-23 | Disposition: A | Discharge status: Home / Self Care | Payer: Medicare Other | Source: Ambulatory Visit | Attending: General Surgery | Admitting: General Surgery

## 2017-01-23 DIAGNOSIS — Z08 Encounter for follow-up examination after completed treatment for malignant neoplasm: Secondary | ICD-10-CM | POA: Insufficient documentation

## 2017-01-23 DIAGNOSIS — D0512 Intraductal carcinoma in situ of left breast: Secondary | ICD-10-CM | POA: Diagnosis present

## 2017-01-23 DIAGNOSIS — Z853 Personal history of malignant neoplasm of breast: Secondary | ICD-10-CM | POA: Insufficient documentation

## 2017-01-23 DIAGNOSIS — R928 Other abnormal and inconclusive findings on diagnostic imaging of breast: Secondary | ICD-10-CM | POA: Diagnosis not present

## 2017-02-01 ENCOUNTER — Ambulatory Visit (INDEPENDENT_AMBULATORY_CARE_PROVIDER_SITE_OTHER): Payer: Medicare Other | Admitting: General Surgery

## 2017-02-01 ENCOUNTER — Encounter: Payer: Self-pay | Admitting: General Surgery

## 2017-02-01 VITALS — BP 142/80 | HR 66 | Resp 16 | Ht 68.0 in | Wt 209.0 lb

## 2017-02-01 DIAGNOSIS — D0512 Intraductal carcinoma in situ of left breast: Secondary | ICD-10-CM | POA: Diagnosis not present

## 2017-02-01 NOTE — Patient Instructions (Addendum)
The patient is aware to call back for any questions or concerns.  Patient will be asked to return to the office in one year with a bilateral diagnostic mammogram.  Scar-fade or mederma cream to scars if desires

## 2017-02-01 NOTE — Progress Notes (Signed)
Patient ID: Eileen Flores, female   DOB: Dec 10, 1935, 81 y.o.   MRN: [id number]  Chief Complaint  Patient presents with  . Follow-up    mammogram    HPI Eileen Flores is a 81 y.o. female who presents for a breast evaluation. The most recent mammogram was done on 01/23/17.  Patient does perform regular self breast checks and gets regular mammograms done.  No new breast issues. 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    HPI  Past Medical History:  Diagnosis Date  . Allergy   . Arthritis 1980  . Bowel trouble 1970  . 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 Cystitis 2008  . Hyperlipidemia   . IBS (irritable bowel syndrome)    since 1970's  . Intraductal papilloma of breast, right 01/2013   Right  . 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 Mammographic microcalcification     Past Surgical History:  Procedure Laterality Date  . BREAST BIOPSY Left 2014   stereo biopsy  . BREAST EXCISIONAL BIOPSY Right 2014  . BREAST LUMPECTOMY Left 2014  . BREAST SURGERY Left Oct 2012   benign stereo biopsy  . BREAST SURGERY Right 01-14-13   excision breast mass, intraductal papilloma  . BREAST SURGERY Left 01-14-13   Excision intra-papillary carcinoma, DCIS, ER 90%, PR 90%.  . cataract surgery Bilateral 2009  . COLONOSCOPY  2011   Dr. Bary Castilla  . dermbrasion   1965  . OVARIAN CYST REMOVAL  1967  . POLYPECTOMY  2006  . THYROIDECTOMY  2006  . TONGUE SURGERY  2009    Family History  Problem Relation Age of Onset  . Colon cancer Unknown   . Colon polyps Unknown   . Cancer Mother 14       liver cancer  . Cancer Father 73       liver cancer  . Alcohol abuse Father   . Breast cancer Neg Hx     Social History Social History   Tobacco Use  . Smoking status: Former Smoker    Types: Cigarettes  . Smokeless  tobacco: Never Used  . Tobacco comment: quit in mid to late 20's  Substance Use Topics  . Alcohol use: No  . Drug use: No    Allergies  Allergen Reactions  . Penicillin G Rash  . Penicillins Rash    Current Outpatient Medications  Medication Sig Dispense Refill  . levothyroxine (SYNTHROID, LEVOTHROID) 112 MCG tablet     . timolol (TIMOPTIC) 0.5 % ophthalmic solution      No current facility-administered medications for this visit.     Review of Systems Review of Systems  Constitutional: Negative.   Respiratory: Negative.   Cardiovascular: Negative.     Blood pressure (!) 142/80, pulse 66, resp. rate 16, height 5\' 8"  (1.727 m), weight 209 lb (94.8 kg), SpO2 99 %.  Physical Exam Physical Exam  Constitutional: She is oriented to person, place, and time. She appears well-developed and well-nourished.  HENT:  Mouth/Throat: Oropharynx is clear and moist.  Eyes: Conjunctivae are normal. No scleral icterus.  Neck: Neck supple.  Cardiovascular: Normal rate, regular rhythm and normal heart sounds.  Pulmonary/Chest: Breath sounds normal. Right breast exhibits no inverted nipple, no mass, no nipple discharge, no skin change and no tenderness. Left breast exhibits  no inverted nipple, no mass, no nipple discharge, no skin change and no tenderness.    Scar bilateral breast  Lymphadenopathy:    She has no cervical adenopathy.    She has no axillary adenopathy.  Neurological: She is alert and oriented to person, place, and time.  Skin: Skin is warm and dry.  Psychiatric: Her behavior is normal.    Data Reviewed January 23, 2017 bilateral diagnostic mammograms were reviewed.  Postoperative scarring notable on the left.  BI-RADS-2.  Assessment    Stable breast exam.    Plan       Scar-fade or mederma cream to scars if desires Patient will be asked to return to the office in one year with a bilateral diagnostic mammogram.  HPI, Physical Exam, Assessment and Plan have been  scribed under the direction and in the presence of Robert Bellow, MD. Karie Fetch, RN  I have completed the exam and reviewed the above documentation for accuracy and completeness.  I agree with the above.  Haematologist has been used and any errors in dictation or transcription are unintentional.  Hervey Ard, M.D., F.A.C.S.   Robert Bellow 02/02/2017, 12:18 PM

## 2017-02-02 ENCOUNTER — Encounter: Payer: Self-pay | Admitting: General Surgery

## 2017-02-23 ENCOUNTER — Other Ambulatory Visit: Payer: Self-pay

## 2017-02-23 ENCOUNTER — Ambulatory Visit (INDEPENDENT_AMBULATORY_CARE_PROVIDER_SITE_OTHER): Payer: Medicare HMO | Admitting: Family Medicine

## 2017-02-23 VITALS — BP 142/82 | HR 64 | Temp 97.6°F | Resp 16 | Wt 214.0 lb

## 2017-02-23 DIAGNOSIS — H6123 Impacted cerumen, bilateral: Secondary | ICD-10-CM | POA: Diagnosis not present

## 2017-02-23 NOTE — Progress Notes (Signed)
Subjective:     Patient ID: Eileen Flores, female   DOB: 1935-06-02, 82 y.o.   MRN: [id number] Chief Complaint  Patient presents with  . Cerumen Impaction   HPI States she usually has them cleaned out by ENT but could not get an appointment. Denies use of Q-tips.  Review of Systems     Objective:   Physical Exam  Constitutional: She appears well-developed and well-nourished. No distress.  HENT:  Right ear canal with cerumen impaction. Left ear canal partially occluded After irrigation Per Jiles Garter: ear canals are patent, TM's intact, and patient reports improvement in her hearing.       Assessment:    1. Bilateral impacted cerumen - EAR CERUMEN REMOVAL    Plan:    Discussed use of Mineral oil drops weekly to keep wax soft.

## 2017-02-23 NOTE — Patient Instructions (Signed)
Discussed use of mineral oil drops weekly to keep wax soft.

## 2017-04-18 ENCOUNTER — Telehealth: Payer: Self-pay

## 2017-04-18 NOTE — Telephone Encounter (Signed)
LMTCB and schedule AWV after 05/05/17. -MM

## 2017-05-15 ENCOUNTER — Ambulatory Visit (INDEPENDENT_AMBULATORY_CARE_PROVIDER_SITE_OTHER): Payer: Medicare HMO

## 2017-05-15 VITALS — BP 144/82 | HR 72 | Temp 98.5°F | Ht 68.0 in | Wt 211.2 lb

## 2017-05-15 DIAGNOSIS — Z0000 Encounter for general adult medical examination without abnormal findings: Secondary | ICD-10-CM | POA: Diagnosis not present

## 2017-05-15 NOTE — Patient Instructions (Signed)
Eileen Flores , Thank you for taking time to come for your Medicare Wellness Visit. I appreciate your ongoing commitment to your health goals. Please review the following plan we discussed and let me know if I can assist you in the future.   Screening recommendations/referrals: Colonoscopy: Up to date Mammogram: Up to date Bone Density: Up to date Recommended yearly ophthalmology/optometry visit for glaucoma screening and checkup Recommended yearly dental visit for hygiene and checkup  Vaccinations: Influenza vaccine: Up to date Pneumococcal vaccine: Unable to update today. Tdap vaccine: Pt declines today.  Shingles vaccine: Pt declines today.     Advanced directives: Advance directive discussed with you today. Even though you declined this today please call our office should you change your mind. Paperwork was give to complete at 80 AWV.   Conditions/risks identified: Recommend increasing fruits and vegetables in diet to at least two servings of each a day.   Next appointment: 06/14/17 @ 2 PM with Fenton Malling.   Preventive Care 80 Years and Older, Female Preventive care refers to lifestyle choices and visits with your health care provider that can promote health and wellness. What does preventive care include?  A yearly physical exam. This is also called an annual well check.  Dental exams once or twice a year.  Routine eye exams. Ask your health care provider how often you should have your eyes checked.  Personal lifestyle choices, including:  Daily care of your teeth and gums.  Regular physical activity.  Eating a healthy diet.  Avoiding tobacco and drug use.  Limiting alcohol use.  Practicing safe sex.  Taking low-dose aspirin every day.  Taking vitamin and mineral supplements as recommended by your health care provider. What happens during an annual well check? The services and screenings done by your health care provider during your annual well check will  depend on your age, overall health, lifestyle risk factors, and family history of disease. Counseling  Your health care provider may ask you questions about your:  Alcohol use.  Tobacco use.  Drug use.  Emotional well-being.  Home and relationship well-being.  Sexual activity.  Eating habits.  History of falls.  Memory and ability to understand (cognition).  Work and work Statistician.  Reproductive health. Screening  You may have the following tests or measurements:  Height, weight, and BMI.  Blood pressure.  Lipid and cholesterol levels. These may be checked every 5 years, or more frequently if you are over 46 years old.  Skin check.  Lung cancer screening. You may have this screening every year starting at age 80 if you have a 30-pack-year history of smoking and currently smoke or have quit within the past 15 years.  Fecal occult blood test (FOBT) of the stool. You may have this test every year starting at age 80.  Flexible sigmoidoscopy or colonoscopy. You may have a sigmoidoscopy every 5 years or a colonoscopy every 10 years starting at age 14.  Hepatitis C blood test.  Hepatitis B blood test.  Sexually transmitted disease (STD) testing.  Diabetes screening. This is done by checking your blood sugar (glucose) after you have not eaten for a while (fasting). You may have this done every 1-3 years.  Bone density scan. This is done to screen for osteoporosis. You may have this done starting at age 3.  Mammogram. This may be done every 1-2 years. Talk to your health care provider about how often you should have regular mammograms. Talk with your health care provider about  your test results, treatment options, and if necessary, the need for more tests. Vaccines  Your health care provider may recommend certain vaccines, such as:  Influenza vaccine. This is recommended every year.  Tetanus, diphtheria, and acellular pertussis (Tdap, Td) vaccine. You may need a  Td booster every 10 years.  Zoster vaccine. You may need this after age 33.  Pneumococcal 13-valent conjugate (PCV13) vaccine. One dose is recommended after age 76.  Pneumococcal polysaccharide (PPSV23) vaccine. One dose is recommended after age 85. Talk to your health care provider about which screenings and vaccines you need and how often you need them. This information is not intended to replace advice given to you by your health care provider. Make sure you discuss any questions you have with your health care provider. Document Released: 02/20/2015 Document Revised: 10/14/2015 Document Reviewed: 11/25/2014 Elsevier Interactive Patient Education  2017 [place] Prevention in the Home Falls can cause injuries. They can happen to people of all ages. There are many things you can do to make your home safe and to help prevent falls. What can I do on the outside of my home?  Regularly fix the edges of walkways and driveways and fix any cracks.  Remove anything that might make you trip as you walk through a door, such as a raised step or threshold.  Trim any bushes or trees on the path to your home.  Use bright outdoor lighting.  Clear any walking paths of anything that might make someone trip, such as rocks or tools.  Regularly check to see if handrails are loose or broken. Make sure that both sides of any steps have handrails.  Any raised decks and porches should have guardrails on the edges.  Have any leaves, snow, or ice cleared regularly.  Use sand or salt on walking paths during winter.  Clean up any spills in your garage right away. This includes oil or grease spills. What can I do in the bathroom?  Use night lights.  Install grab bars by the toilet and in the tub and shower. Do not use towel bars as grab bars.  Use non-skid mats or decals in the tub or shower.  If you need to sit down in the shower, use a plastic, non-slip stool.  Keep the floor dry. Clean  up any water that spills on the floor as soon as it happens.  Remove soap buildup in the tub or shower regularly.  Attach bath mats securely with double-sided non-slip rug tape.  Do not have throw rugs and other things on the floor that can make you trip. What can I do in the bedroom?  Use night lights.  Make sure that you have a light by your bed that is easy to reach.  Do not use any sheets or blankets that are too big for your bed. They should not hang down onto the floor.  Have a firm chair that has side arms. You can use this for support while you get dressed.  Do not have throw rugs and other things on the floor that can make you trip. What can I do in the kitchen?  Clean up any spills right away.  Avoid walking on wet floors.  Keep items that you use a lot in easy-to-reach places.  If you need to reach something above you, use a strong step stool that has a grab bar.  Keep electrical cords out of the way.  Do not use floor polish or wax  that makes floors slippery. If you must use wax, use non-skid floor wax.  Do not have throw rugs and other things on the floor that can make you trip. What can I do with my stairs?  Do not leave any items on the stairs.  Make sure that there are handrails on both sides of the stairs and use them. Fix handrails that are broken or loose. Make sure that handrails are as long as the stairways.  Check any carpeting to make sure that it is firmly attached to the stairs. Fix any carpet that is loose or worn.  Avoid having throw rugs at the top or bottom of the stairs. If you do have throw rugs, attach them to the floor with carpet tape.  Make sure that you have a light switch at the top of the stairs and the bottom of the stairs. If you do not have them, ask someone to add them for you. What else can I do to help prevent falls?  Wear shoes that:  Do not have high heels.  Have rubber bottoms.  Are comfortable and fit you well.  Are  closed at the toe. Do not wear sandals.  If you use a stepladder:  Make sure that it is fully opened. Do not climb a closed stepladder.  Make sure that both sides of the stepladder are locked into place.  Ask someone to hold it for you, if possible.  Clearly mark and make sure that you can see:  Any grab bars or handrails.  First and last steps.  Where the edge of each step is.  Use tools that help you move around (mobility aids) if they are needed. These include:  Canes.  Walkers.  Scooters.  Crutches.  Turn on the lights when you go into a dark area. Replace any light bulbs as soon as they burn out.  Set up your furniture so you have a clear path. Avoid moving your furniture around.  If any of your floors are uneven, fix them.  If there are any pets around you, be aware of where they are.  Review your medicines with your doctor. Some medicines can make you feel dizzy. This can increase your chance of falling. Ask your doctor what other things that you can do to help prevent falls. This information is not intended to replace advice given to you by your health care provider. Make sure you discuss any questions you have with your health care provider. Document Released: 11/20/2008 Document Revised: 07/02/2015 Document Reviewed: 02/28/2014 Elsevier Interactive Patient Education  2017 [employer].

## 2017-05-15 NOTE — Progress Notes (Signed)
Subjective:   Eileen Flores is a 82 y.o. female who presents for Medicare Annual (Subsequent) preventive examination.  Review of Systems:  N/A  Cardiac Risk Factors include: advanced age (>73men, >49 women);obesity (BMI >30kg/m2)     Objective:     Vitals: BP (!) 144/82 (BP Location: Left Arm)   Pulse 72   Temp 98.5 F (36.9 C) (Oral)   Ht 5\' 8"  (1.727 m)   Wt 211 lb 3.2 oz (95.8 kg)   BMI 32.11 kg/m   Body mass index is 32.11 kg/m.  Advanced Directives 05/15/2017 05/05/2016  Does Patient Have a Medical Advance Directive? No No  Would patient like information on creating a medical advance directive? No - Patient declined Yes (ED - Information included in AVS)    Tobacco Social History   Tobacco Use  Smoking Status Former Smoker  . Types: Cigarettes  Smokeless Tobacco Never Used  Tobacco Comment   quit in mid to late 20's     Counseling given: Not Answered Comment: quit in mid to late 20's   Clinical Intake:  Pre-visit preparation completed: Yes  Pain : No/denies pain Pain Score: 0-No pain     Nutritional Status: BMI > 30  Obese Nutritional Risks: None Diabetes: No  How often do you need to have someone help you when you read instructions, pamphlets, or other written materials from your doctor or pharmacy?: 1 - Never  Interpreter Needed?: No  Information entered by :: [facility], LPN  Past Medical History:  Diagnosis Date  . Allergy   . Arthritis 1980  . Bowel trouble 1970  . 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 Cystitis 2008  . Hyperlipidemia   . IBS (irritable bowel syndrome)    since 1970's  . Intraductal papilloma of breast, right 01/2013   Right  . 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 Mammographic microcalcification    Past Surgical History:  Procedure Laterality  Date  . BREAST BIOPSY Left 2014   stereo biopsy  . BREAST EXCISIONAL BIOPSY Right 2014  . BREAST LUMPECTOMY Left 2014  . BREAST SURGERY Left Oct 2012   benign stereo biopsy  . BREAST SURGERY Right 01-14-13   excision breast mass, intraductal papilloma  . BREAST SURGERY Left 01-14-13   Excision intra-papillary carcinoma, DCIS, ER 90%, PR 90%.  . cataract surgery Bilateral 2009  . COLONOSCOPY  2011   Dr. Bary Castilla  . dermbrasion   1965  . OVARIAN CYST REMOVAL  1967  . POLYPECTOMY  2006  . THYROIDECTOMY  2006  . TONGUE SURGERY  2009   Family History  Problem Relation Age of Onset  . Colon cancer Unknown   . Colon polyps Unknown   . Cancer Mother 66       liver cancer  . Cancer Father 23       liver cancer  . Alcohol abuse Father   . Breast cancer Neg Hx    Social History   Socioeconomic History  . Marital status: Married    Spouse name: Not on file  . Number of children: 2  . Years of education: Not on file  . Highest education level: Some college, no degree  Occupational History  . Not on file  Social Needs  . Financial resource strain: Not hard at all  . Food  insecurity:    Worry: Never true    Inability: Never true  . Transportation needs:    Medical: No    Non-medical: No  Tobacco Use  . Smoking status: Former Smoker    Types: Cigarettes  . Smokeless tobacco: Never Used  . Tobacco comment: quit in mid to late 20's  Substance and Sexual Activity  . Alcohol use: No    Comment: rare- 1 drink  . Drug use: No  . Sexual activity: Not on file  Lifestyle  . Physical activity:    Days per week: Not on file    Minutes per session: Not on file  . Stress: Only a little  Relationships  . Social connections:    Talks on phone: Not on file    Gets together: Not on file    Attends religious service: Not on file    Active member of club or organization: Not on file    Attends meetings of clubs or organizations: Not on file    Relationship status: Not on file  Other  Topics Concern  . Not on file  Social History Narrative  . Not on file    Outpatient Encounter Medications as of 05/15/2017  Medication Sig  . levothyroxine (SYNTHROID, LEVOTHROID) 112 MCG tablet Take 112 mcg by mouth daily before breakfast.   . Melatonin 1 MG CAPS Take by mouth.  . timolol (TIMOPTIC) 0.5 % ophthalmic solution Place 1 drop into both eyes daily.    No facility-administered encounter medications on file as of 05/15/2017.     Activities of Daily Living In your present state of health, do you have any difficulty performing the following activities: 05/15/2017  Hearing? N  Vision? N  Difficulty concentrating or making decisions? N  Walking or climbing stairs? N  Dressing or bathing? N  Doing errands, shopping? N  Preparing Food and eating ? N  Using the Toilet? N  Managing your Medications? N  Managing your Finances? N  Housekeeping or managing your Housekeeping? N  Some recent data might be hidden    Patient Care Team: Mar Daring, PA-C as PCP - General (Family Medicine) Bary Castilla, Forest Gleason, MD (General Surgery) Leandrew Koyanagi, MD as Referring Physician (Ophthalmology)    Assessment:   This is a routine wellness examination for Eileen Flores.  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 DIET - EAT MORE FRUITS AND VEGETABLES     Recommend increasing fruits and vegetables in diet to at least two servings of each a day.        Fall Risk Fall Risk  05/15/2017 05/05/2016  Falls in the past year? No No   Is the patient's home free of loose throw rugs in walkways, pet beds, electrical cords, etc?   yes      Grab bars in the bathroom? no      Handrails on the stairs?   yes       Adequate lighting?   yes  Timed Get Up and Go performed: N/A  Depression Screen PHQ 2/9 Scores 05/15/2017 05/15/2017 05/05/2016 05/05/2016  PHQ - 2 Score 0 0 0 0  PHQ- 9 Score 4 - 4 -     Cognitive Function     6CIT Screen 05/15/2017 05/05/2016  What Year? 0 points 0 points  What month? 0 points 0 points  What time? 0 points 0 points  Count back from 20 0 points 0 points  Months in reverse 0 points 0 points  Repeat phrase 0 points 0 points  Total Score 0 0    Immunization History  Administered Date(s) Administered  . Influenza, High Dose Seasonal PF 11/21/2013, 11/05/2016    Qualifies for Shingles Vaccine? Due for Shingles vaccine. Declined my offer to administer today. Education has been provided regarding the importance of this vaccine. Pt has been advised to call her insurance company to determine her out of pocket expense. Advised she may also receive this vaccine at her local pharmacy or Health Dept. Verbalized acceptance and understanding.  Screening Tests Health Maintenance  Topic Date Due  . PNA vac Low Risk Adult (1 of 2 - PCV13) 09/16/2002  . TETANUS/TDAP  02/07/2026 (Originally 09/15/1954)  . INFLUENZA VACCINE  09/07/2017  . DEXA SCAN  Completed    Cancer Screenings: Lung: Low Dose CT Chest recommended if Age 51-80 years, 30 pack-year currently smoking OR have quit w/in 15years. Patient does not qualify. Breast:  Up to date on Mammogram? Yes   Up to date of Bone Density/Dexa? Yes Colorectal: Up to date  Additional Screenings: Hepatitis C Screening: N/A     Plan:  I have personally reviewed and addressed the Medicare Annual Wellness questionnaire and have noted the following in the patient's chart:  A. Medical and social history B. Use of alcohol, tobacco or illicit drugs  C. Current medications and supplements D. Functional ability and status E.  Nutritional status F.  Physical activity G. Advance directives H. List of other physicians I.    Hospitalizations, surgeries, and ER visits in previous 12 months J.  [place] such as hearing and vision if needed, cognitive and depression L. Referrals and appointments - none  In addition, I have reviewed and discussed with patient certain preventive protocols, quality metrics, and best practice recommendations. A written personalized care plan for preventive services as well as general preventive health recommendations were provided to patient.  See attached scanned questionnaire for additional information.   Signed,  Fabio Neighbors, LPN Nurse Health Advisor   Nurse Recommendations: Pt declines the tetanus an pneumonia vaccines today. Pt to check records and see which pneumonia vaccine she had previously and let us know. Can update HM once she brings records. Requested she brings them to her CPE with PCP.

## 2017-06-14 ENCOUNTER — Encounter: Payer: Self-pay | Admitting: Physician Assistant

## 2017-06-14 ENCOUNTER — Ambulatory Visit (INDEPENDENT_AMBULATORY_CARE_PROVIDER_SITE_OTHER): Payer: Medicare HMO | Admitting: Physician Assistant

## 2017-06-14 VITALS — BP 134/68 | Temp 97.9°F | Resp 16 | Ht 68.0 in | Wt 213.0 lb

## 2017-06-14 DIAGNOSIS — E7800 Pure hypercholesterolemia, unspecified: Secondary | ICD-10-CM

## 2017-06-14 DIAGNOSIS — Z1211 Encounter for screening for malignant neoplasm of colon: Secondary | ICD-10-CM

## 2017-06-14 DIAGNOSIS — Z0000 Encounter for general adult medical examination without abnormal findings: Secondary | ICD-10-CM

## 2017-06-14 DIAGNOSIS — N939 Abnormal uterine and vaginal bleeding, unspecified: Secondary | ICD-10-CM | POA: Diagnosis not present

## 2017-06-14 DIAGNOSIS — D0512 Intraductal carcinoma in situ of left breast: Secondary | ICD-10-CM | POA: Diagnosis not present

## 2017-06-14 DIAGNOSIS — R8281 Pyuria: Secondary | ICD-10-CM

## 2017-06-14 DIAGNOSIS — N390 Urinary tract infection, site not specified: Secondary | ICD-10-CM | POA: Diagnosis not present

## 2017-06-14 DIAGNOSIS — Z1239 Encounter for other screening for malignant neoplasm of breast: Secondary | ICD-10-CM

## 2017-06-14 DIAGNOSIS — R311 Benign essential microscopic hematuria: Secondary | ICD-10-CM | POA: Diagnosis not present

## 2017-06-14 DIAGNOSIS — E785 Hyperlipidemia, unspecified: Secondary | ICD-10-CM | POA: Insufficient documentation

## 2017-06-14 DIAGNOSIS — Z1231 Encounter for screening mammogram for malignant neoplasm of breast: Secondary | ICD-10-CM | POA: Diagnosis not present

## 2017-06-14 DIAGNOSIS — E039 Hypothyroidism, unspecified: Secondary | ICD-10-CM | POA: Diagnosis not present

## 2017-06-14 LAB — POCT URINALYSIS DIPSTICK
Bilirubin, UA: NEGATIVE
Glucose, UA: NEGATIVE
KETONES UA: NEGATIVE
Nitrite, UA: NEGATIVE
PROTEIN UA: NEGATIVE
Spec Grav, UA: 1.025 (ref 1.010–1.025)
Urobilinogen, UA: 0.2 E.U./dL
pH, UA: 6 (ref 5.0–8.0)

## 2017-06-14 MED ORDER — LEVOTHYROXINE SODIUM 112 MCG PO TABS: 112.0000 ug | ORAL_TABLET | Freq: Every day | ORAL | 3 refills | Status: DC

## 2017-06-14 NOTE — Progress Notes (Signed)
Patient: Eileen Flores, Female    DOB: 1936-01-26, 82 y.o.   MRN: [id number] Visit Date: 06/14/2017  Today's Provider: Mar Daring, PA-C   Chief Complaint  Patient presents with  . Annual Wellness Exam   Subjective:  Patient saw Alyson Ingles for AWE on 05/15/2017.   Annual wellness visit Eileen Flores is a 82 y.o. female. She feels well. She reports exercising not regularly. She reports she is sleeping well.  Mammogram- 01/23/2017. Normal. Colonoscopy- 06/09/2009. Diverticulosis. Recheck in 5 years.   Review of Systems  Constitutional: Negative.   HENT: Negative.   Eyes: Negative.   Respiratory: Negative.   Cardiovascular: Negative.   Gastrointestinal: Negative.   Endocrine: Negative.   Genitourinary: Positive for vaginal bleeding.  Musculoskeletal: Positive for arthralgias and back pain.  Allergic/Immunologic: Negative.   Neurological: Negative.   Hematological: Negative.   Psychiatric/Behavioral: Positive for sleep disturbance.    Social History   Socioeconomic History  . Marital status: Married    Spouse name: Not on file  . Number of children: 2  . Years of education: Not on file  . Highest education level: Some college, no degree  Occupational History  . Not on file  Social Needs  . Financial resource strain: Not hard at all  . Food insecurity:    Worry: Never true    Inability: Never true  . Transportation needs:    Medical: No    Non-medical: No  Tobacco Use  . Smoking status: Former Smoker    Types: Cigarettes  . Smokeless tobacco: Never Used  . Tobacco comment: quit in mid to late 20's  Substance and Sexual Activity  . Alcohol use: No    Comment: rare- 1 drink  . Drug use: No  . Sexual activity: Not on file  Lifestyle  . Physical activity:    Days per week: Not on file    Minutes per session: Not on file  . Stress: Only a little  Relationships  . Social connections:    Talks on phone: Not on file    Gets together: Not on file    Attends religious service: Not on file    Active member of club or organization: Not on file    Attends meetings of clubs or organizations: Not on file    Relationship status: Not on file  . Intimate partner violence:    Fear of current or ex partner: Not on file    Emotionally abused: Not on file    Physically abused: Not on file    Forced sexual activity: Not on file  Other Topics Concern  . Not on file  Social History Narrative  . Not on file    Past Medical History:  Diagnosis Date  . Allergy   . Arthritis 1980  . Bowel trouble 1970  . 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 Cystitis 2008  . Hyperlipidemia   . IBS (irritable bowel syndrome)    since 1970's  . Intraductal papilloma of breast, right 01/2013   Right  . 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 Mammographic microcalcification      Patient Active Problem List   Diagnosis Date Noted  . GERD (gastroesophageal reflux disease) 05/05/2016  . Anxiety 11/16/2015  . Arthritis 11/16/2015  . Bunion 11/16/2015  .  Bursitis 11/16/2015  . Colon polyp 11/16/2015  . DD (diverticular disease) 11/16/2015  . Hammer toe 11/16/2015  . Acquired hypothyroidism 11/16/2015  . Tendinitis 11/16/2015  . DCIS (ductal carcinoma in situ) of breast 01/17/2013  . Papilloma of breast 01/17/2013  . Breast neoplasm, Tis (DCIS), left 01/07/2013    Past Surgical History:  Procedure Laterality Date  . BREAST BIOPSY Left 2014   stereo biopsy  . BREAST EXCISIONAL BIOPSY Right 2014  . BREAST LUMPECTOMY Left 2014  . BREAST SURGERY Left Oct 2012   benign stereo biopsy  . BREAST SURGERY Right 01-14-13   excision breast mass, intraductal papilloma  . BREAST SURGERY Left 01-14-13   Excision intra-papillary carcinoma, DCIS, ER 90%, PR 90%.  . cataract surgery Bilateral 2009    . COLONOSCOPY  2011   Dr. Bary Castilla  . dermbrasion   1965  . OVARIAN CYST REMOVAL  1967  . POLYPECTOMY  2006  . THYROIDECTOMY  2006  . TONGUE SURGERY  2009    Her family history includes Alcohol abuse in her father; Cancer (age of onset: 62) in her father; Cancer (age of onset: 62) in her mother; Colon cancer in her unknown relative; Colon polyps in her unknown relative. There is no history of Breast cancer.      Current Outpatient Medications:  .  levothyroxine (SYNTHROID, LEVOTHROID) 112 MCG tablet, Take 112 mcg by mouth daily before breakfast. , Disp: , Rfl:  .  Melatonin 1 MG CAPS, Take by mouth., Disp: , Rfl:  .  timolol (TIMOPTIC) 0.5 % ophthalmic solution, Place 1 drop into both eyes daily. , Disp: , Rfl:   Patient Care Team: Mar Daring, PA-C as PCP - General (Family Medicine) Bary Castilla, Forest Gleason, MD (General Surgery) Leandrew Koyanagi, MD as Referring Physician (Ophthalmology)     Objective:   Vitals: BP 134/68 (BP Location: Left Arm, Patient Position: Sitting, Cuff Size: Large)   Temp 97.9 F (36.6 C)   Resp 16   Ht 5\' 8"  (1.727 m)   Wt 213 lb (96.6 kg)   BMI 32.39 kg/m   Physical Exam  Constitutional: She is oriented to person, place, and time. She appears well-developed and well-nourished. No distress.  HENT:  Head: Normocephalic and atraumatic.  Right Ear: Hearing, tympanic membrane, external ear and ear canal normal.  Left Ear: Hearing, tympanic membrane, external ear and ear canal normal.  Nose: Nose normal.  Mouth/Throat: Uvula is midline, oropharynx is clear and moist and mucous membranes are normal. No oropharyngeal exudate.  Eyes: Pupils are equal, round, and reactive to light. Conjunctivae and EOM are normal. Right eye exhibits no discharge. Left eye exhibits no discharge. No scleral icterus.  Neck: Normal range of motion. Neck supple. No JVD present. Carotid bruit is not present. No tracheal deviation present. No thyromegaly present.   Cardiovascular: Normal rate, regular rhythm, normal heart sounds and intact distal pulses. Exam reveals no gallop and no friction rub.  No murmur heard. Pulmonary/Chest: Effort normal and breath sounds normal. No respiratory distress. She has no wheezes. She has no rales. She exhibits no tenderness. Right breast exhibits no inverted nipple, no mass, no nipple discharge, no skin change and no tenderness. Left breast exhibits no inverted nipple, no mass, no nipple discharge, no skin change and no tenderness. No breast swelling or discharge.  Abdominal: Soft. Bowel sounds are normal. She exhibits no distension and no mass. There is no tenderness. There is no rebound and no guarding.  Musculoskeletal: Normal range of  motion. She exhibits no edema or tenderness.  Lymphadenopathy:    She has no cervical adenopathy.  Neurological: She is alert and oriented to person, place, and time.  Skin: Skin is warm and dry. No rash noted. She is not diaphoretic.  Psychiatric: She has a normal mood and affect. Her behavior is normal. Judgment and thought content normal.  Vitals reviewed.   Activities of Daily Living In your present state of health, do you have any difficulty performing the following activities: 05/15/2017  Hearing? N  Vision? N  Difficulty concentrating or making decisions? N  Walking or climbing stairs? N  Dressing or bathing? N  Doing errands, shopping? N  Preparing Food and eating ? N  Using the Toilet? N  Managing your Medications? N  Managing your Finances? N  Housekeeping or managing your Housekeeping? N  Some recent data might be hidden    Fall Risk Assessment Fall Risk  05/15/2017 05/05/2016  Falls in the past year? No No     Depression Screen PHQ 2/9 Scores 05/15/2017 05/15/2017 05/05/2016 05/05/2016  PHQ - 2 Score 0 0 0 0  PHQ- 9 Score 4 - 4 -    Cognitive Testing - 6-CIT 6CIT Screen 05/15/2017 05/05/2016  What Year? 0 points 0 points  What month? 0 points 0 points  What time?  0 points 0 points  Count back from 20 0 points 0 points  Months in reverse 0 points 0 points  Repeat phrase 0 points 0 points  Total Score 0 0         Assessment & Plan:     Annual Wellness Visit  Reviewed patient's Family Medical History Reviewed and updated list of patient's medical providers Assessment of cognitive impairment was done Assessed patient's functional ability Established a written schedule for health screening [place] Completed and Reviewed  Exercise Activities and Dietary recommendations Goals    . DIET - EAT MORE FRUITS AND VEGETABLES     Recommend increasing fruits and vegetables in diet to at least two servings of each a day.        Immunization History  Administered Date(s) Administered  . Influenza, High Dose Seasonal PF 11/21/2013, 11/05/2016    Health Maintenance  Topic Date Due  . PNA vac Low Risk Adult (1 of 2 - PCV13) 09/16/2002  . TETANUS/TDAP  02/07/2026 (Originally 09/15/1954)  . INFLUENZA VACCINE  09/07/2017  . DEXA SCAN  Completed     Discussed health benefits of physical activity, and encouraged her to engage in regular exercise appropriate for her age and condition.    1. Annual physical exam Patient mentions having vaginal bleeding on ROS today. She declines pelvic exam today. She reports bleeding as being pink in color and scant spotting. Occurs mostly in the morning and lessens by end of day. Unsure of cause but patient was agreeable to referral to GYN since she would not let me do the exam. Otherwise exam unremarkable. Up to date on screenings and immunizations.   2. Breast cancer screening H/O DCIS in 2014. Followed by Dr. Bary Castilla. Last mammogram was 01/2017.   3. Colon cancer screening Followed by Dr. Bary Castilla. Patient is due but not completing at this time due to no complaints and her age.   4. Vaginal bleeding New onset. Unsure of cause. Patient states been going on for "months" but will not  pinpoint exact start. Will check labs as below. Referral placed to GYN since patient declines pelvic exam today. UA not  normal. Had hematuria and pyuria. Will send for microscopy and culture and will f/u pending results. Possibly not vaginal bleeding, may be hematuria. If microscopy positive for RBC will cancel and refer to Urology instead.  - CBC w/Diff/Platelet - Comprehensive Metabolic Panel (CMET) - Ambulatory referral to Gynecology - POCT Urinalysis Dipstick  5. Acquired hypothyroidism Stable. Diagnosis pulled for medication refill. Continue current medical treatment plan. Will check labs as below and f/u pending results. - CBC w/Diff/Platelet - Comprehensive Metabolic Panel (CMET) - TSH - levothyroxine (SYNTHROID, LEVOTHROID) 112 MCG tablet; Take 1 tablet (112 mcg total) by mouth daily before breakfast.  Dispense: 90 tablet; Refill: 3  6. Ductal carcinoma in situ (DCIS) of left breast H/O this. Followed by Dr. Bary Castilla. - CBC w/Diff/Platelet - Comprehensive Metabolic Panel (CMET)  7. Pure hypercholesterolemia Diet controlled. Will check labs as below and f/u pending results. - CBC w/Diff/Platelet - Comprehensive Metabolic Panel (CMET) - Lipid Profile  8. Benign essential microscopic hematuria See above medical treatment plan for #4. - Urinalysis, microscopic only  9. Pyuria See above medical treatment plan for #4. - Urine Culture  ------------------------------------------------------------------------------------------------------------    Mar Daring, PA-C  [facility]

## 2017-06-14 NOTE — Patient Instructions (Signed)
Health Maintenance for Postmenopausal Women Menopause is a normal process in which your reproductive ability comes to an end. This process happens gradually over a span of months to years, usually between the ages of 22 and 9. Menopause is complete when you have missed 12 consecutive menstrual periods. It is important to talk with your health care provider about some of the most common conditions that affect postmenopausal women, such as heart disease, cancer, and bone loss (osteoporosis). Adopting a healthy lifestyle and getting preventive care can help to promote your health and wellness. Those actions can also lower your chances of developing some of these common conditions. What should I know about menopause? During menopause, you may experience a number of symptoms, such as:  Moderate-to-severe hot flashes.  Night sweats.  Decrease in sex drive.  Mood swings.  Headaches.  Tiredness.  Irritability.  Memory problems.  Insomnia.  Choosing to treat or not to treat menopausal changes is an individual decision that you make with your health care provider. What should I know about hormone replacement therapy and supplements? Hormone therapy products are effective for treating symptoms that are associated with menopause, such as hot flashes and night sweats. Hormone replacement carries certain risks, especially as you become older. If you are thinking about using estrogen or estrogen with progestin treatments, discuss the benefits and risks with your health care provider. What should I know about heart disease and stroke? Heart disease, heart attack, and stroke become more likely as you age. This may be due, in part, to the hormonal changes that your body experiences during menopause. These can affect how your body processes dietary fats, triglycerides, and cholesterol. Heart attack and stroke are both medical emergencies. There are many things that you can do to help prevent heart disease  and stroke:  Have your blood pressure checked at least every 1-2 years. High blood pressure causes heart disease and increases the risk of stroke.  If you are 53-22 years old, ask your health care provider if you should take aspirin to prevent a heart attack or a stroke.  Do not use any tobacco products, including cigarettes, chewing tobacco, or electronic cigarettes. If you need help quitting, ask your health care provider.  It is important to eat a healthy diet and maintain a healthy weight. ? Be sure to include plenty of vegetables, fruits, low-fat dairy products, and lean protein. ? Avoid eating foods that are high in solid fats, added sugars, or salt (sodium).  Get regular exercise. This is one of the most important things that you can do for your health. ? Try to exercise for at least 150 minutes each week. The type of exercise that you do should increase your heart rate and make you sweat. This is known as moderate-intensity exercise. ? Try to do strengthening exercises at least twice each week. Do these in addition to the moderate-intensity exercise.  Know your numbers.Ask your health care provider to check your cholesterol and your blood glucose. Continue to have your blood tested as directed by your health care provider.  What should I know about cancer screening? There are several types of cancer. Take the following steps to reduce your risk and to catch any cancer development as early as possible. Breast Cancer  Practice breast self-awareness. ? This means understanding how your breasts normally appear and feel. ? It also means doing regular breast self-exams. Let your health care provider know about any changes, no matter how small.  If you are 40  or older, have a clinician do a breast exam (clinical breast exam or CBE) every year. Depending on your age, family history, and medical history, it may be recommended that you also have a yearly breast X-ray (mammogram).  If you  have a family history of breast cancer, talk with your health care provider about genetic screening.  If you are at high risk for breast cancer, talk with your health care provider about having an MRI and a mammogram every year.  Breast cancer (BRCA) gene test is recommended for women who have family members with BRCA-related cancers. Results of the assessment will determine the need for genetic counseling and BRCA1 and for BRCA2 testing. BRCA-related cancers include these types: ? Breast. This occurs in males or females. ? Ovarian. ? Tubal. This may also be called fallopian tube cancer. ? Cancer of the abdominal or pelvic lining (peritoneal cancer). ? Prostate. ? Pancreatic.  Cervical, Uterine, and Ovarian Cancer Your health care provider may recommend that you be screened regularly for cancer of the pelvic organs. These include your ovaries, uterus, and vagina. This screening involves a pelvic exam, which includes checking for microscopic changes to the surface of your cervix (Pap test).  For women ages 21-65, health care providers may recommend a pelvic exam and a Pap test every three years. For women ages 79-65, they may recommend the Pap test and pelvic exam, combined with testing for human papilloma virus (HPV), every five years. Some types of HPV increase your risk of cervical cancer. Testing for HPV may also be done on women of any age who have unclear Pap test results.  Other health care providers may not recommend any screening for nonpregnant women who are considered low risk for pelvic cancer and have no symptoms. Ask your health care provider if a screening pelvic exam is right for you.  If you have had past treatment for cervical cancer or a condition that could lead to cancer, you need Pap tests and screening for cancer for at least 20 years after your treatment. If Pap tests have been discontinued for you, your risk factors (such as having a new sexual partner) need to be  reassessed to determine if you should start having screenings again. Some women have medical problems that increase the chance of getting cervical cancer. In these cases, your health care provider may recommend that you have screening and Pap tests more often.  If you have a family history of uterine cancer or ovarian cancer, talk with your health care provider about genetic screening.  If you have vaginal bleeding after reaching menopause, tell your health care provider.  There are currently no reliable tests available to screen for ovarian cancer.  Lung Cancer Lung cancer screening is recommended for adults 69-62 years old who are at high risk for lung cancer because of a history of smoking. A yearly low-dose CT scan of the lungs is recommended if you:  Currently smoke.  Have a history of at least 30 pack-years of smoking and you currently smoke or have quit within the past 15 years. A pack-year is smoking an average of one pack of cigarettes per day for one year.  Yearly screening should:  Continue until it has been 15 years since you quit.  Stop if you develop a health problem that would prevent you from having lung cancer treatment.  Colorectal Cancer  This type of cancer can be detected and can often be prevented.  Routine colorectal cancer screening usually begins at  age 42 and continues through age 45.  If you have risk factors for colon cancer, your health care provider may recommend that you be screened at an earlier age.  If you have a family history of colorectal cancer, talk with your health care provider about genetic screening.  Your health care provider may also recommend using home test kits to check for hidden blood in your stool.  A small camera at the end of a tube can be used to examine your colon directly (sigmoidoscopy or colonoscopy). This is done to check for the earliest forms of colorectal cancer.  Direct examination of the colon should be repeated every  5-10 years until age 71. However, if early forms of precancerous polyps or small growths are found or if you have a family history or genetic risk for colorectal cancer, you may need to be screened more often.  Skin Cancer  Check your skin from head to toe regularly.  Monitor any moles. Be sure to tell your health care provider: ? About any new moles or changes in moles, especially if there is a change in a mole's shape or color. ? If you have a mole that is larger than the size of a pencil eraser.  If any of your family members has a history of skin cancer, especially at a young age, talk with your health care provider about genetic screening.  Always use sunscreen. Apply sunscreen liberally and repeatedly throughout the day.  Whenever you are outside, protect yourself by wearing long sleeves, pants, a wide-brimmed hat, and sunglasses.  What should I know about osteoporosis? Osteoporosis is a condition in which bone destruction happens more quickly than new bone creation. After menopause, you may be at an increased risk for osteoporosis. To help prevent osteoporosis or the bone fractures that can happen because of osteoporosis, the following is recommended:  If you are 46-71 years old, get at least 1,000 mg of calcium and at least 600 mg of vitamin D per day.  If you are older than age 55 but younger than age 65, get at least 1,200 mg of calcium and at least 600 mg of vitamin D per day.  If you are older than age 54, get at least 1,200 mg of calcium and at least 800 mg of vitamin D per day.  Smoking and excessive alcohol intake increase the risk of osteoporosis. Eat foods that are rich in calcium and vitamin D, and do weight-bearing exercises several times each week as directed by your health care provider. What should I know about how menopause affects my mental health? Depression may occur at any age, but it is more common as you become older. Common symptoms of depression  include:  Low or sad mood.  Changes in sleep patterns.  Changes in appetite or eating patterns.  Feeling an overall lack of motivation or enjoyment of activities that you previously enjoyed.  Frequent crying spells.  Talk with your health care provider if you think that you are experiencing depression. What should I know about immunizations? It is important that you get and maintain your immunizations. These include:  Tetanus, diphtheria, and pertussis (Tdap) booster vaccine.  Influenza every year before the flu season begins.  Pneumonia vaccine.  Shingles vaccine.  Your health care provider may also recommend other immunizations. This information is not intended to replace advice given to you by your health care provider. Make sure you discuss any questions you have with your health care provider. Document Released: 03/18/2005  Document Revised: 08/14/2015 Document Reviewed: 10/28/2014 Elsevier Interactive Patient Education  2018 [employer]  

## 2017-06-15 LAB — URINALYSIS, MICROSCOPIC ONLY: CASTS: NONE SEEN /LPF

## 2017-06-16 ENCOUNTER — Telehealth: Payer: Self-pay

## 2017-06-16 LAB — URINE CULTURE

## 2017-06-16 NOTE — Telephone Encounter (Signed)
Patient advised as below. Patient verbalizes understanding and is in agreement with treatment plan.  

## 2017-06-16 NOTE — Telephone Encounter (Signed)
-----   Message from Mar Daring, PA-C sent at 06/16/2017  8:20 AM EDT ----- Urine culture was negative. No UTI. Continue with GYN referral.

## 2017-06-23 ENCOUNTER — Ambulatory Visit (INDEPENDENT_AMBULATORY_CARE_PROVIDER_SITE_OTHER): Payer: Medicare HMO | Admitting: Obstetrics and Gynecology

## 2017-06-23 ENCOUNTER — Encounter: Payer: Self-pay | Admitting: Obstetrics and Gynecology

## 2017-06-23 VITALS — BP 128/78 | HR 65 | Ht 68.0 in | Wt 215.0 lb

## 2017-06-23 DIAGNOSIS — N950 Postmenopausal bleeding: Secondary | ICD-10-CM | POA: Diagnosis not present

## 2017-06-23 DIAGNOSIS — N895 Stricture and atresia of vagina: Secondary | ICD-10-CM | POA: Diagnosis not present

## 2017-06-23 NOTE — Progress Notes (Signed)
Gynecology H&P  Chief Complaint:  Chief Complaint  Patient presents with  . Vaginal Bleeding    Referred by [facility], light pink spotting     History of Present Illness: Patient is a 81 y.o. G2P2 presents evaluation of postmenopausal bleeding. The patient states she has had a single episode(s) of bleeding in the past 1 month(s).  The most recent episode occurred  1  month(s) ago.  The bleeding has been limited to spotting . She describes the blood as dark brown in appearance.  She has had no additional complaints. She deniesbloating, cramping, early satiety and abdominal pain. She does not have a history of abnormal pap smears..  The patient is not currently sexually active There are no other aggravating factors reported. There are no alleviating factors reported. The patient's past medical history is noncontributory notable for history of breast cancer.   She has nothad prior work up for postmenopausal bleeding.  Not currently on any HRT, denies prior tamoxifen use.  Review of Systems: 10 point review of systems negative unless otherwise noted in HPI  Past Medical History:  Past Medical History:  Diagnosis Date  . Allergy   . Arthritis 1980  . Bowel trouble 1970  . 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 Cystitis 2008  . Hyperlipidemia   . IBS (irritable bowel syndrome)    since 1970's  . Intraductal papilloma of breast, right 01/2013   Right  . 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 Mammographic microcalcification     Past Surgical History:  Past Surgical History:  Procedure Laterality Date  . BREAST BIOPSY Left 2014   stereo biopsy  . BREAST EXCISIONAL BIOPSY Right 2014  . BREAST LUMPECTOMY Left 2014  . BREAST SURGERY Left Oct 2012   benign stereo biopsy  . BREAST SURGERY Right 01-14-13   excision breast  mass, intraductal papilloma  . BREAST SURGERY Left 01-14-13   Excision intra-papillary carcinoma, DCIS, ER 90%, PR 90%.  . cataract surgery Bilateral 2009  . COLONOSCOPY  2011   Dr. Bary Castilla  . dermbrasion   1965  . OVARIAN CYST REMOVAL  1967  . POLYPECTOMY  2006  . THYROIDECTOMY  2006  . TONGUE SURGERY  2009    Family History:  Family History  Problem Relation Age of Onset  . Colon cancer Unknown   . Colon polyps Unknown   . Cancer Mother 90       liver cancer  . Cancer Father 62       liver cancer  . Alcohol abuse Father   . Breast cancer Neg Hx     Social History:  Social History   Socioeconomic History  . Marital status: Married    Spouse name: Not on file  . Number of children: 2  . Years of education: Not on file  . Highest education level: Some college, no degree  Occupational History  . Not on file  Social Needs  . Financial resource strain: Not hard at all  . Food insecurity:    Worry: Never true    Inability: Never true  . Transportation needs:    Medical: No    Non-medical: No  Tobacco Use  . Smoking status: Former Smoker    Types: Cigarettes  . Smokeless tobacco: Never Used  . Tobacco comment: quit in mid  to late 20's  Substance and Sexual Activity  . Alcohol use: No    Comment: rare- 1 drink  . Drug use: No  . Sexual activity: Not on file  Lifestyle  . Physical activity:    Days per week: Not on file    Minutes per session: Not on file  . Stress: Only a little  Relationships  . Social connections:    Talks on phone: Not on file    Gets together: Not on file    Attends religious service: Not on file    Active member of club or organization: Not on file    Attends meetings of clubs or organizations: Not on file    Relationship status: Not on file  . Intimate partner violence:    Fear of current or ex partner: Not on file    Emotionally abused: Not on file    Physically abused: Not on file    Forced sexual activity: Not on file  Other  Topics Concern  . Not on file  Social History Narrative  . Not on file    Allergies:  Allergies  Allergen Reactions  . Penicillin G Rash  . Penicillins Rash    Medications: Prior to Admission medications   Medication Sig Start Date End Date Taking? Authorizing Provider  levothyroxine (SYNTHROID, LEVOTHROID) 112 MCG tablet Take 1 tablet (112 mcg total) by mouth daily before breakfast. 06/14/17  Yes Burnette, Clearnce Sorrel, PA-C  Melatonin 1 MG CAPS Take by mouth.   Yes [provider]  timolol (TIMOPTIC) 0.5 % ophthalmic solution Place 1 drop into both eyes daily.  02/10/14  Yes [provider]    Physical Exam Vitals: Blood pressure 128/78, pulse 65, height 5\' 8"  (1.727 m), weight 215 lb (97.5 kg).  General: NAD HEENT: normocephalic, anicteric Pulmonary: No increased work of breathing Genitourinary:  External: Normal external female genitalia.  Normal urethral meatus, normal Bartholin's and Skene's glands.    Vagina: stenotic, no evidence of prolapse.  Pediatric speculum required in order to perform exam    Cervix: Grossly normal in appearance, no bleeding  Uterus: Non-enlarged, mobile, normal contour.  No CMT  Adnexa: ovaries non-enlarged, no adnexal masses  Rectal: deferred Extremities: no edema, erythema, or tenderness Neurologic: Grossly intact Psychiatric: mood appropriate, affect full  Assessment: 82 y.o. G2P2 presenting for evaluation of postmenopausal bleeding  Plan: Problem List Items Addressed This Visit    None    Visit Diagnoses    Vaginal stenosis    -  Primary   Relevant Orders   US Pelvis Complete   Postmenopausal bleeding       Relevant Orders   US Pelvis Complete      1) We discussed that menopause is a clinical diagnosis made after 12 months of amenorrhea.  The average age of menopause in the  [place] population is 34 but there may be significant variation.  Any bleeding that happens after a 12 month period of amenorrhea warrants  further work.  Possible etiologies of postmenopausal bleeding were discussed with the patient today.  These may range from benign etiologies such as urethral prolapse and atrophy, to indeterminate lesions such as submucosal fibroids or polyps which would require resection to accurately evaluate. The role of unopposed estrogen in the development of  dndometrial hyperplasia or carcinoma is discussed.  The risk of endometrial hyperplasia is linearly correlated with increasing BMI given the production of estrone by adipose tissue.  Work up will be include transvaginal ultrasound  to assess the thickness of the endometrial lining as well as to assess for focal uterine lesions.  Negative ultrasound evaluation, defined as the absence of focal lesions and endometrial stripe of <61mm, effectively rules out carcinoma and confirms atrophy as the most likely etiology.  Should focal lesions be present these generally require hysteroscopic resection.  Should lining be greater >93mm endometrial biopsy is warranted to rule out hyperplasia or frank endometrial cancer.  Continued episodes of bleeding despite negative ultrasound also warrant endometrial sampling.  As the cervical pathology may also be implicated in postmenopausal bleeding prior cervical cytology was reviewed and repeated if required per ASCCP guidelines.   2) Evaluation by pelvc ultrasound scheduled, with follow up after [place]. EMB discussed and may be performed as well. Pros and cons of these modalities of testing discussed.   3) Return in about 1 week (around 06/30/2017) for abdominal gyn ultrasound and follow up.   Malachy Mood, MD, [facility], [facility] 06/23/2017, 11:05 PM

## 2017-06-27 DIAGNOSIS — E7800 Pure hypercholesterolemia, unspecified: Secondary | ICD-10-CM | POA: Diagnosis not present

## 2017-06-27 DIAGNOSIS — D0512 Intraductal carcinoma in situ of left breast: Secondary | ICD-10-CM | POA: Diagnosis not present

## 2017-06-27 DIAGNOSIS — E039 Hypothyroidism, unspecified: Secondary | ICD-10-CM | POA: Diagnosis not present

## 2017-06-27 DIAGNOSIS — N939 Abnormal uterine and vaginal bleeding, unspecified: Secondary | ICD-10-CM | POA: Diagnosis not present

## 2017-06-28 ENCOUNTER — Telehealth: Payer: Self-pay

## 2017-06-28 LAB — COMPREHENSIVE METABOLIC PANEL
ALK PHOS: 78 IU/L (ref 39–117)
ALT: 10 IU/L (ref 0–32)
AST: 13 IU/L (ref 0–40)
Albumin/Globulin Ratio: 1.4 (ref 1.2–2.2)
Albumin: 3.8 g/dL (ref 3.5–4.8)
BUN/Creatinine Ratio: 18 (ref 12–28)
BUN: 17 mg/dL (ref 8–27)
Bilirubin Total: 0.3 mg/dL (ref 0.0–1.2)
CALCIUM: 9.3 mg/dL (ref 8.7–10.3)
CO2: 23 mmol/L (ref 20–29)
Chloride: 105 mmol/L (ref 96–106)
Creatinine, Ser: 0.96 mg/dL (ref 0.57–1.00)
GFR calc Af Amer: 65 mL/min/{1.73_m2} (ref >59)
GFR, EST NON AFRICAN AMERICAN: 56 mL/min/{1.73_m2} — AB (ref >59)
GLOBULIN, TOTAL: 2.8 g/dL (ref 1.5–4.5)
Glucose: 93 mg/dL (ref 65–99)
POTASSIUM: 4.4 mmol/L (ref 3.5–5.2)
SODIUM: 142 mmol/L (ref 134–144)
Total Protein: 6.6 g/dL (ref 6.0–8.5)

## 2017-06-28 LAB — CBC WITH DIFFERENTIAL/PLATELET
BASOS: 0 %
Basophils Absolute: 0 10*3/uL (ref 0.0–0.2)
EOS (ABSOLUTE): 0.2 10*3/uL (ref 0.0–0.4)
Eos: 2 %
HEMATOCRIT: 40.8 % (ref 34.0–46.6)
Hemoglobin: 13.4 g/dL (ref 11.1–15.9)
Immature Grans (Abs): 0 10*3/uL (ref 0.0–0.1)
Immature Granulocytes: 0 %
Lymphocytes Absolute: 1.9 10*3/uL (ref 0.7–3.1)
Lymphs: 24 %
MCH: 30.7 pg (ref 26.6–33.0)
MCHC: 32.8 g/dL (ref 31.5–35.7)
MCV: 94 fL (ref 79–97)
MONOS ABS: 0.7 10*3/uL (ref 0.1–0.9)
Monocytes: 9 %
NEUTROS ABS: 5.1 10*3/uL (ref 1.4–7.0)
Neutrophils: 65 %
PLATELETS: 280 10*3/uL (ref 150–450)
RBC: 4.36 x10E6/uL (ref 3.77–5.28)
RDW: 13.5 % (ref 12.3–15.4)
WBC: 7.9 10*3/uL (ref 3.4–10.8)

## 2017-06-28 LAB — TSH: TSH: 1.21 u[IU]/mL (ref 0.450–4.500)

## 2017-06-28 LAB — LIPID PANEL
CHOLESTEROL TOTAL: 200 mg/dL — AB (ref 100–199)
Chol/HDL Ratio: 4.5 ratio — ABNORMAL HIGH (ref 0.0–4.4)
HDL: 44 mg/dL (ref >39)
LDL CALC: 129 mg/dL — AB (ref 0–99)
Triglycerides: 134 mg/dL (ref 0–149)
VLDL Cholesterol Cal: 27 mg/dL (ref 5–40)

## 2017-06-28 NOTE — Telephone Encounter (Signed)
LMTCB

## 2017-06-28 NOTE — Telephone Encounter (Signed)
-----   Message from Mar Daring, PA-C sent at 06/28/2017  3:31 PM EDT ----- All labs are within normal limits and stable.  Thanks! -JB

## 2017-06-29 NOTE — Telephone Encounter (Signed)
Pt informed and voiced understanding of results. 

## 2017-06-29 NOTE — Telephone Encounter (Signed)
LMTCB

## 2017-07-04 DIAGNOSIS — H40003 Preglaucoma, unspecified, bilateral: Secondary | ICD-10-CM | POA: Diagnosis not present

## 2017-07-05 DIAGNOSIS — R69 Illness, unspecified: Secondary | ICD-10-CM | POA: Diagnosis not present

## 2017-07-13 ENCOUNTER — Ambulatory Visit (INDEPENDENT_AMBULATORY_CARE_PROVIDER_SITE_OTHER): Payer: Medicare HMO

## 2017-07-13 ENCOUNTER — Ambulatory Visit (INDEPENDENT_AMBULATORY_CARE_PROVIDER_SITE_OTHER): Payer: Medicare HMO | Admitting: Obstetrics and Gynecology

## 2017-07-13 ENCOUNTER — Encounter: Payer: Self-pay | Admitting: Obstetrics and Gynecology

## 2017-07-13 ENCOUNTER — Other Ambulatory Visit: Payer: Self-pay

## 2017-07-13 VITALS — BP 138/80 | HR 67 | Ht 68.0 in | Wt 217.0 lb

## 2017-07-13 DIAGNOSIS — N950 Postmenopausal bleeding: Secondary | ICD-10-CM

## 2017-07-13 DIAGNOSIS — N895 Stricture and atresia of vagina: Secondary | ICD-10-CM

## 2017-07-13 NOTE — Progress Notes (Signed)
Gynecology Ultrasound Follow Up  Chief Complaint:  Chief Complaint  Patient presents with  . Follow-up    Gyn Ultrasound/EMB ?     History of Present Illness: Patient is a 82 y.o. female who presents today for ultrasound evaluation of PMB.  Ultrasound demonstrates the following findgins Adnexa: no adnexal masses or patholgoy Uterus: Non-enlarged, no fibroids with endometrial stripe  <29mm Additional: no free fluid  No further bleeding noted by patient since initial episode.  Review of Systems: Review of Systems  Constitutional: Negative.   Gastrointestinal: Negative.     Past Medical History:  Past Medical History:  Diagnosis Date  . Allergy   . Arthritis 1980  . Bowel trouble 1970  . 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 Cystitis 2008  . Hyperlipidemia   . IBS (irritable bowel syndrome)    since 1970's  . Intraductal papilloma of breast, right 01/2013   Right  . 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 Mammographic microcalcification     Past Surgical History:  Past Surgical History:  Procedure Laterality Date  . BREAST BIOPSY Left 2014   stereo biopsy  . BREAST EXCISIONAL BIOPSY Right 2014  . BREAST LUMPECTOMY Left 2014  . BREAST SURGERY Left Oct 2012   benign stereo biopsy  . BREAST SURGERY Right 01-14-13   excision breast mass, intraductal papilloma  . BREAST SURGERY Left 01-14-13   Excision intra-papillary carcinoma, DCIS, ER 90%, PR 90%.  . cataract surgery Bilateral 2009  . COLONOSCOPY  2011   Dr. Bary Castilla  . dermbrasion   1965  . OVARIAN CYST REMOVAL  1967  . POLYPECTOMY  2006  . THYROIDECTOMY  2006  . TONGUE SURGERY  2009    Gynecologic History:  No LMP recorded. Patient is postmenopausal.  Family History:  Family History  Problem Relation Age of Onset  . Colon cancer  Unknown   . Colon polyps Unknown   . Cancer Mother 40       liver cancer  . Cancer Father 29       liver cancer  . Alcohol abuse Father   . Breast cancer Neg Hx     Social History:  Social History   Socioeconomic History  . Marital status: Married    Spouse name: Not on file  . Number of children: 2  . Years of education: Not on file  . Highest education level: Some college, no degree  Occupational History  . Not on file  Social Needs  . Financial resource strain: Not hard at all  . Food insecurity:    Worry: Never true    Inability: Never true  . Transportation needs:    Medical: No    Non-medical: No  Tobacco Use  . Smoking status: Former Smoker    Types: Cigarettes  . Smokeless tobacco: Never Used  . Tobacco comment: quit in mid to late 20's  Substance and Sexual Activity  . Alcohol use: No    Comment: rare- 1 drink  . Drug use: No  . Sexual activity: Not on file  Lifestyle  . Physical activity:    Days per week: Not on file    Minutes per session: Not on file  . Stress: Only a little  Relationships  . Social connections:    Talks on phone: Not on  file    Gets together: Not on file    Attends religious service: Not on file    Active member of club or organization: Not on file    Attends meetings of clubs or organizations: Not on file    Relationship status: Not on file  . Intimate partner violence:    Fear of current or ex partner: Not on file    Emotionally abused: Not on file    Physically abused: Not on file    Forced sexual activity: Not on file  Other Topics Concern  . Not on file  Social History Narrative  . Not on file    Allergies:  Allergies  Allergen Reactions  . Penicillin G Rash  . Penicillins Rash    Medications: Prior to Admission medications   Medication Sig Start Date End Date Taking? Authorizing Provider  levothyroxine (SYNTHROID, LEVOTHROID) 112 MCG tablet Take 1 tablet (112 mcg total) by mouth daily before breakfast.  06/14/17  Yes Burnette, Clearnce Sorrel, PA-C  Melatonin 1 MG CAPS Take by mouth.   Yes [provider]  timolol (TIMOPTIC) 0.5 % ophthalmic solution Place 1 drop into both eyes daily.  02/10/14  Yes [provider]    Physical Exam Vitals: Blood pressure 138/80, pulse 67, height 5\' 8"  (1.727 m), weight 217 lb (98.4 kg).  General: NAD HEENT: normocephalic, anicteric Pulmonary: No increased work of breathing Extremities: no edema, erythema, or tenderness Neurologic: Grossly intact, normal gait Psychiatric: mood appropriate, affect full  US Pelvis Complete  Result Date: 07/13/2017 ULTRASOUND REPORT Patient Name: Eileen Flores DOB: [birth year] MRN: [id number] Location: [facility] Date of Service: 07/13/2017 Indications:PMB Findings: Transabdominal only The uterus is anteverted and measures 6.66 x 5.29 x 3.41. Echo texture is homogenous without evidence of focal masses. The Endometrium measures 3.25 mm. - there appears to be a small amount of fluid in the endometrium measuring 2.10mm Right Ovary  is not identified Left Ovary is not identified Survey of the adnexa demonstrates no adnexal masses. There is no free fluid in the cul de sac. Impression: 1. Small amount of fluid in the endometrium, otherwise unremarkable Recommendations: 1.Clinical correlation with the patient's History and Physical Exam. Edwena Bunde, RDMS, RVT Images reviewed.  Normal GYN study without visualized pathology. The endometrial stripe measures under 7mm.  Malachy Mood, MD, [facility], [facility] 07/13/2017, 10:40 AM    Assessment: 82 y.o. G2P2 postmenopausal bleeding follow up  Plan: Problem List Items Addressed This Visit    None    Visit Diagnoses    PMB (postmenopausal bleeding)    -  Primary      1) PMB - ultrasound reviewed and lining less than 27mm.  Discussed that this rules out endometrial hyperplasia or malignancy.  Should further episodes occur would recommend  obtaining tissue biopsy.  Given her vaginal stenosis this would likely need to be attempted in the OR and even then may not be feasible.  Discussed with patient to represent should this not be a self limited episode and she develop further vaginal bleeding.  2) A total of 15 minutes were spent in face-to-face contact with the patient during this encounter with over half of that time devoted to counseling and coordination of care.  3) Return if symptoms worsen or fail to improve.    Malachy Mood, MD, [facility], [facility] 07/14/2017, 1:33 PM

## 2017-11-10 ENCOUNTER — Encounter: Payer: Self-pay | Admitting: Physician Assistant

## 2017-11-10 ENCOUNTER — Ambulatory Visit (INDEPENDENT_AMBULATORY_CARE_PROVIDER_SITE_OTHER): Payer: Medicare HMO | Admitting: Physician Assistant

## 2017-11-10 DIAGNOSIS — H6123 Impacted cerumen, bilateral: Secondary | ICD-10-CM

## 2017-11-10 NOTE — Progress Notes (Signed)
       Patient: Eileen Flores Female    DOB: Jun 23, 1935   82 y.o.   MRN: [id number] Visit Date: 11/10/2017  Today's Provider: Mar Daring, PA-C   Chief Complaint  Patient presents with  . Ear Pain   Subjective:    HPI Eileen Flores is an 82 yr old female that comes to the office today with complaints of plugged ear sensation. She reports noting the ear fullness worsening over the last week, then yesterday noticed decreased hearing in the right ear with some ear pain.     Allergies  Allergen Reactions  . Penicillin G Rash  . Penicillins Rash     Current Outpatient Medications:  .  levothyroxine (SYNTHROID, LEVOTHROID) 112 MCG tablet, Take 1 tablet (112 mcg total) by mouth daily before breakfast., Disp: 90 tablet, Rfl: 3 .  Melatonin 1 MG CAPS, Take by mouth., Disp: , Rfl:  .  timolol (TIMOPTIC) 0.5 % ophthalmic solution, Place 1 drop into both eyes daily. , Disp: , Rfl:   Review of Systems  Constitutional: Negative.   HENT: Positive for ear pain (ear fullness ) and hearing loss.   Respiratory: Negative.   Cardiovascular: Negative.   Gastrointestinal: Negative.   Musculoskeletal: Negative.     Social History   Tobacco Use  . Smoking status: Former Smoker    Types: Cigarettes  . Smokeless tobacco: Never Used  . Tobacco comment: quit in mid to late 20's  Substance Use Topics  . Alcohol use: No    Comment: rare- 1 drink   Objective:   There were no vitals taken for this visit. There were no vitals filed for this visit.   Physical Exam  Constitutional: She appears well-developed and well-nourished. No distress.  HENT:  Head: Normocephalic and atraumatic.  Nose: Nose normal.  Mouth/Throat: Uvula is midline, oropharynx is clear and moist and mucous membranes are normal. No oropharyngeal exudate.  Cerumen impaction noted bilaterally. Ear lavage successful in left ear. Unsuccessful in right ear  Eyes: Pupils are equal, round, and reactive to light.  Conjunctivae are normal. Right eye exhibits no discharge. Left eye exhibits no discharge. No scleral icterus.  Neck: Normal range of motion. Neck supple. No tracheal deviation present. No thyromegaly present.  Cardiovascular: Normal rate, regular rhythm and normal heart sounds. Exam reveals no gallop and no friction rub.  No murmur heard. Pulmonary/Chest: Effort normal and breath sounds normal. No stridor. No respiratory distress. She has no wheezes. She has no rales.  Lymphadenopathy:    She has no cervical adenopathy.  Skin: Skin is warm and dry. She is not diaphoretic.  Vitals reviewed.      Assessment & Plan:     1. Bilateral impacted cerumen Left ear successful. Right ear not. Use debrox drops at home. Return next week if wax has not dislodged and will repeat ear lavage.  - Ear Lavage       Mar Daring, PA-C  [facility]

## 2017-11-10 NOTE — Patient Instructions (Signed)
Debrox drops

## 2017-11-17 ENCOUNTER — Ambulatory Visit: Payer: Medicare HMO | Admitting: Physician Assistant

## 2017-11-27 ENCOUNTER — Other Ambulatory Visit: Payer: Self-pay

## 2017-11-27 DIAGNOSIS — D0512 Intraductal carcinoma in situ of left breast: Secondary | ICD-10-CM

## 2017-11-28 DIAGNOSIS — R69 Illness, unspecified: Secondary | ICD-10-CM | POA: Diagnosis not present

## 2018-01-01 DIAGNOSIS — H40003 Preglaucoma, unspecified, bilateral: Secondary | ICD-10-CM | POA: Diagnosis not present

## 2018-01-08 DIAGNOSIS — H40003 Preglaucoma, unspecified, bilateral: Secondary | ICD-10-CM | POA: Diagnosis not present

## 2018-01-22 ENCOUNTER — Encounter: Payer: Self-pay | Admitting: Family Medicine

## 2018-01-22 ENCOUNTER — Ambulatory Visit (INDEPENDENT_AMBULATORY_CARE_PROVIDER_SITE_OTHER): Payer: Medicare HMO | Admitting: Family Medicine

## 2018-01-22 VITALS — BP 142/82 | HR 64 | Temp 98.0°F | Resp 16 | Wt 215.4 lb

## 2018-01-22 DIAGNOSIS — H9192 Unspecified hearing loss, left ear: Secondary | ICD-10-CM

## 2018-01-22 NOTE — Patient Instructions (Signed)
You will get a call about the referral.

## 2018-01-22 NOTE — Progress Notes (Signed)
  Subjective:     Patient ID: Eileen Flores, female   DOB: 11-18-1935, 82 y.o.   MRN: [id number] Chief Complaint  Patient presents with  . Cerumen Impaction    Patient comes in office today with complaints of impaction in her left ear and decreased hearing over the past 3 months.    HPI Had ear lavage 11/10/17 with little improvement in her hearing. She has been using Debrox drops with little change. No recent cold or significant allergy sx.  Review of Systems     Objective:   Physical Exam Constitutional:      General: She is not in acute distress.    Appearance: She is not ill-appearing.  HENT:     Right Ear: Tympanic membrane normal.     Left Ear: Tympanic membrane normal.     Ears:     Comments: Ear canals are patent with small amount of cerumen bilaterally. Neurological:     Mental Status: She is alert.        Assessment:    1. Decreased hearing, left - Ambulatory referral to ENT    Plan:    Further f/u pending ENT evaluation

## 2018-01-25 ENCOUNTER — Ambulatory Visit
Admission: RE | Admit: 2018-01-25 | Discharge: 2018-01-25 | Disposition: A | Discharge status: Home / Self Care | Payer: Medicare HMO | Source: Ambulatory Visit | Attending: General Surgery | Admitting: General Surgery

## 2018-01-25 DIAGNOSIS — D0512 Intraductal carcinoma in situ of left breast: Secondary | ICD-10-CM | POA: Insufficient documentation

## 2018-01-25 DIAGNOSIS — Z853 Personal history of malignant neoplasm of breast: Secondary | ICD-10-CM | POA: Diagnosis not present

## 2018-01-25 DIAGNOSIS — R928 Other abnormal and inconclusive findings on diagnostic imaging of breast: Secondary | ICD-10-CM | POA: Diagnosis not present

## 2018-02-01 ENCOUNTER — Ambulatory Visit: Payer: Medicare HMO | Admitting: General Surgery

## 2018-02-19 DIAGNOSIS — H903 Sensorineural hearing loss, bilateral: Secondary | ICD-10-CM | POA: Diagnosis not present

## 2018-02-19 DIAGNOSIS — H6123 Impacted cerumen, bilateral: Secondary | ICD-10-CM | POA: Diagnosis not present

## 2018-03-20 ENCOUNTER — Other Ambulatory Visit: Payer: Self-pay

## 2018-03-20 ENCOUNTER — Encounter: Payer: Self-pay | Admitting: General Surgery

## 2018-03-20 ENCOUNTER — Ambulatory Visit: Payer: Medicare HMO | Admitting: General Surgery

## 2018-03-20 VITALS — BP 132/84 | HR 70 | Temp 97.4°F | Resp 15 | Ht 67.0 in | Wt 217.0 lb

## 2018-03-20 DIAGNOSIS — D0510 Intraductal carcinoma in situ of unspecified breast: Secondary | ICD-10-CM

## 2018-03-20 NOTE — Patient Instructions (Signed)
Patient will be asked to return to the office in one year with a bilateral screening mammogram. The patient is aware to call back for any questions or concerns. 

## 2018-03-20 NOTE — Progress Notes (Signed)
Patient ID: Eileen Flores, female   DOB: [birth year], 83 y.o.   MRN: [id number]  Chief Complaint  Patient presents with  . Follow-up    mammogram     HPI Eileen Flores is a 83 y.o. female who presents for a breast evaluation. The most recent mammogram was done on 01/25/2018.  Patient does perform regular self breast checks and gets regular mammograms done.     Past Medical History:  Diagnosis Date  . Allergy   . Arthritis 1980  . Bowel trouble 1970  . 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 Cystitis 2008  . Hyperlipidemia   . IBS (irritable bowel syndrome)    since 1970's  . Intraductal papilloma of breast, right 01/2013   Right  . 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 Mammographic microcalcification   . Wears hearing aid     Past Surgical History:  Procedure Laterality Date  . BREAST BIOPSY Left 2014   stereo biopsy  . BREAST EXCISIONAL BIOPSY Right 2014   papilloma  . BREAST LUMPECTOMY Left 2014  . BREAST SURGERY Left Oct 2012   benign stereo biopsy  . BREAST SURGERY Right 01-14-13   excision breast mass, intraductal papilloma  . BREAST SURGERY Left 01-14-13   Excision intra-papillary carcinoma, DCIS, ER 90%, PR 90%.  . cataract surgery Bilateral 2009  . COLONOSCOPY  2011   Dr. Bary Castilla  . dermbrasion   1965  . OVARIAN CYST REMOVAL  1967  . POLYPECTOMY  2006  . THYROIDECTOMY  2006  . TONGUE SURGERY  2009    Family History  Problem Relation Age of Onset  . Colon cancer Other   . Colon polyps Other   . Cancer Mother 20       liver cancer  . Cancer Father 41       liver cancer  . Alcohol abuse Father   . Breast cancer Neg Hx     Social History Social History   Tobacco Use  . Smoking status: Former Smoker    Types: Cigarettes  . Smokeless tobacco: Never Used  . Tobacco comment: quit in  mid to late 20's  Substance Use Topics  . Alcohol use: No    Comment: rare- 1 drink  . Drug use: No    Allergies  Allergen Reactions  . Penicillin G Rash  . Penicillins Rash    Current Outpatient Medications  Medication Sig Dispense Refill  . levothyroxine (SYNTHROID, LEVOTHROID) 112 MCG tablet Take 1 tablet (112 mcg total) by mouth daily before breakfast. 90 tablet 3  . Melatonin 1 MG CAPS Take by mouth.    . timolol (TIMOPTIC) 0.5 % ophthalmic solution Place 1 drop into both eyes daily.      No current facility-administered medications for this visit.     Review of Systems Review of Systems  Constitutional: Negative.   Respiratory: Negative.   Cardiovascular: Negative.     Blood pressure 132/84, pulse 70, temperature (!) 97.4 F (36.3 C), temperature source Skin, resp. rate 15, height 5\' 7"  (1.702 m), weight 217 lb (98.4 kg), SpO2 98 %.  Physical Exam Physical Exam Exam conducted with a chaperone present.  Constitutional:      Appearance: She is well-developed.  Eyes:     General: No scleral icterus.    Conjunctiva/sclera: Conjunctivae normal.  Neck:     Musculoskeletal: Neck supple.  Cardiovascular:     Rate and Rhythm: Normal rate and regular rhythm.     Heart sounds: Normal heart sounds.  Pulmonary:     Effort: Pulmonary effort is normal.     Breath sounds: Normal breath sounds.  Chest:     Breasts:        Right: No inverted nipple, mass, nipple discharge, skin change or tenderness.        Left: No inverted nipple, mass, nipple discharge, skin change or tenderness.    Lymphadenopathy:     Cervical: No cervical adenopathy.     Upper Body:     Right upper body: No supraclavicular or axillary adenopathy.     Left upper body: No supraclavicular or axillary adenopathy.  Skin:    General: Skin is warm and dry.  Neurological:     Mental Status: She is alert and oriented to person, place, and time.     Data Reviewed Bilateral diagnostic anagrams dated  January 25, 2017 were reviewed.  Postsurgical changes.  BI-RADS-2.  Assessment    No evidence of recurrent disease.    Plan   Patient will be asked to return to the office in one year with a bilateral screening mammogram.The patient is aware to call back for any questions or concerns.  HPI, Physical Exam, Assessment and Plan have been scribed under the direction and in the presence of Hervey Ard, MD.  Gaspar Cola, CMA  I have completed the exam and reviewed the above documentation for accuracy and completeness.  I agree with the above.  Haematologist has been used and any errors in dictation or transcription are unintentional.  Hervey Ard, M.D., F.A.C.S.  Forest Gleason  03/21/2018, 6:32 PM

## 2018-04-24 ENCOUNTER — Telehealth: Payer: Self-pay

## 2018-04-24 NOTE — Telephone Encounter (Signed)
Called pt to r/s AWV that is scheduled for 05/18/18. I will be out of the office that day. Pt also has a f/u with Tawanna Sat on the same day. Requested a CB on my direct #. -MM

## 2018-04-24 NOTE — Telephone Encounter (Signed)
Pt called back and wanted to push both apts out due to the Covid-19. Rescheduled to 06/18/18 @ 11 and 11:40 AM. Pt concerned about getting a refill of Levothyroxine if she has to push out apt again. Advised pt to contact office if this happens and see about getting a lab order to test for TSH prior to anymore refills. Pt stated understanding.  -MM

## 2018-05-17 ENCOUNTER — Ambulatory Visit: Payer: Self-pay

## 2018-05-18 ENCOUNTER — Ambulatory Visit: Payer: Self-pay | Admitting: Physician Assistant

## 2018-05-18 ENCOUNTER — Ambulatory Visit: Payer: Self-pay

## 2018-06-14 ENCOUNTER — Other Ambulatory Visit: Payer: Self-pay

## 2018-06-14 NOTE — Telephone Encounter (Signed)
Patient is returning McKenzie's call.

## 2018-06-15 ENCOUNTER — Other Ambulatory Visit: Payer: Self-pay | Admitting: *Deleted

## 2018-06-15 ENCOUNTER — Other Ambulatory Visit: Payer: Self-pay | Admitting: Physician Assistant

## 2018-06-15 DIAGNOSIS — E039 Hypothyroidism, unspecified: Secondary | ICD-10-CM

## 2018-06-15 MED ORDER — LEVOTHYROXINE SODIUM 112 MCG PO TABS: 112.0000 ug | ORAL_TABLET | Freq: Every day | ORAL | 3 refills | Status: DC

## 2018-06-15 NOTE — Telephone Encounter (Signed)
Spoke with pt who states she is ok with completing the AWV telephonically. Pt also inquired about a refill on Levothyroxine. Stated she spoke with someone about it this morning. This is the only encounter I see. Advised pt to check with pharmacy and see if Rx was sent in. Pt stated she would and will CB if not. I was unable to place a future order.  Nikki- did you refill this?

## 2018-06-15 NOTE — Progress Notes (Signed)
Levothyroxine refilled to [facility]

## 2018-06-15 NOTE — Telephone Encounter (Signed)
Pt called back and stated [facility] in [place] did not have the prescription. Pt would like a 90 day refill sent in if ok by PCP. Thank you!

## 2018-06-18 ENCOUNTER — Ambulatory Visit (INDEPENDENT_AMBULATORY_CARE_PROVIDER_SITE_OTHER): Payer: Medicare HMO

## 2018-06-18 ENCOUNTER — Encounter: Payer: Medicare HMO | Admitting: Physician Assistant

## 2018-06-18 DIAGNOSIS — Z13820 Encounter for screening for osteoporosis: Secondary | ICD-10-CM | POA: Diagnosis not present

## 2018-06-18 DIAGNOSIS — Z0000 Encounter for general adult medical examination without abnormal findings: Secondary | ICD-10-CM | POA: Diagnosis not present

## 2018-06-18 NOTE — Patient Instructions (Addendum)
[name] , Thank you for taking time to come for your Medicare Wellness Visit. I appreciate your ongoing commitment to your health goals. Please review the following plan we discussed and let me know if I can assist you in the future.   Screening recommendations/referrals: Colonoscopy: No longer required.  Mammogram: No longer required.  Bone Density: Ordered today. Pt provided with contact info and advised to call to schedule appt. Pt aware the office will call re: appt Recommended yearly ophthalmology/optometry visit for glaucoma screening and checkup Recommended yearly dental visit for hygiene and checkup  Vaccinations: Influenza vaccine: Up to date Pneumococcal vaccine: Up to date on Prevnar 32. Pt states she has received the Penumovax 23 in the past. Requested records to be faxed to clinic.  Tdap vaccine: Pt declines today.  Shingles vaccine: Pt declines today.     Advanced directives: Advance directive discussed with you today. Even though you declined this today please call our office should you change your mind and we can give you the proper paperwork for you to fill out.  Conditions/risks identified: Fall risk prevention, Increase water intake to 6-8 8 oz glasses a day and start exercising for 3 days a week for at least 30 minutes at a time.   Next appointment: 09/26/18 @ 10:00 AM with Fenton Malling.    Preventive Care 41 Years and Older, Female Preventive care refers to lifestyle choices and visits with your health care provider that can promote health and wellness. What does preventive care include?  A yearly physical exam. This is also called an annual well check.  Dental exams once or twice a year.  Routine eye exams. Ask your health care provider how often you should have your eyes checked.  Personal lifestyle choices, including:  Daily care of your teeth and gums.  Regular physical activity.  Eating a healthy diet.  Avoiding tobacco and drug use.   Limiting alcohol use.  Practicing safe sex.  Taking low-dose aspirin every day.  Taking vitamin and mineral supplements as recommended by your health care provider. What happens during an annual well check? The services and screenings done by your health care provider during your annual well check will depend on your age, overall health, lifestyle risk factors, and family history of disease. Counseling  Your health care provider may ask you questions about your:  Alcohol use.  Tobacco use.  Drug use.  Emotional well-being.  Home and relationship well-being.  Sexual activity.  Eating habits.  History of falls.  Memory and ability to understand (cognition).  Work and work Statistician.  Reproductive health. Screening  You may have the following tests or measurements:  Height, weight, and BMI.  Blood pressure.  Lipid and cholesterol levels. These may be checked every 5 years, or more frequently if you are over 6 years old.  Skin check.  Lung cancer screening. You may have this screening every year starting at age 22 if you have a 30-pack-year history of smoking and currently smoke or have quit within the past 15 years.  Fecal occult blood test (FOBT) of the stool. You may have this test every year starting at age 39.  Flexible sigmoidoscopy or colonoscopy. You may have a sigmoidoscopy every 5 years or a colonoscopy every 10 years starting at age 37.  Hepatitis C blood test.  Hepatitis B blood test.  Sexually transmitted disease (STD) testing.  Diabetes screening. This is done by checking your blood sugar (glucose) after you have not eaten for a  while (fasting). You may have this done every 1-3 years.  Bone density scan. This is done to screen for osteoporosis. You may have this done starting at age 32.  Mammogram. This may be done every 1-2 years. Talk to your health care provider about how often you should have regular mammograms. Talk with your health care  provider about your test results, treatment options, and if necessary, the need for more tests. Vaccines  Your health care provider may recommend certain vaccines, such as:  Influenza vaccine. This is recommended every year.  Tetanus, diphtheria, and acellular pertussis (Tdap, Td) vaccine. You may need a Td booster every 10 years.  Zoster vaccine. You may need this after age 60.  Pneumococcal 13-valent conjugate (PCV13) vaccine. One dose is recommended after age 57.  Pneumococcal polysaccharide (PPSV23) vaccine. One dose is recommended after age 69. Talk to your health care provider about which screenings and vaccines you need and how often you need them. This information is not intended to replace advice given to you by your health care provider. Make sure you discuss any questions you have with your health care provider. Document Released: 02/20/2015 Document Revised: 10/14/2015 Document Reviewed: 11/25/2014 Elsevier Interactive Patient Education  2017 [employer] Prevention in the Home Falls can cause injuries. They can happen to people of all ages. There are many things you can do to make your home safe and to help prevent falls. What can I do on the outside of my home?  Regularly fix the edges of walkways and driveways and fix any cracks.  Remove anything that might make you trip as you walk through a door, such as a raised step or threshold.  Trim any bushes or trees on the path to your home.  Use bright outdoor lighting.  Clear any walking paths of anything that might make someone trip, such as rocks or tools.  Regularly check to see if handrails are loose or broken. Make sure that both sides of any steps have handrails.  Any raised decks and porches should have guardrails on the edges.  Have any leaves, snow, or ice cleared regularly.  Use sand or salt on walking paths during winter.  Clean up any spills in your garage right away. This includes oil or grease  spills. What can I do in the bathroom?  Use night lights.  Install grab bars by the toilet and in the tub and shower. Do not use towel bars as grab bars.  Use non-skid mats or decals in the tub or shower.  If you need to sit down in the shower, use a plastic, non-slip stool.  Keep the floor dry. Clean up any water that spills on the floor as soon as it happens.  Remove soap buildup in the tub or shower regularly.  Attach bath mats securely with double-sided non-slip rug tape.  Do not have throw rugs and other things on the floor that can make you trip. What can I do in the bedroom?  Use night lights.  Make sure that you have a light by your bed that is easy to reach.  Do not use any sheets or blankets that are too big for your bed. They should not hang down onto the floor.  Have a firm chair that has side arms. You can use this for support while you get dressed.  Do not have throw rugs and other things on the floor that can make you trip. What can I do in the  kitchen?  Clean up any spills right away.  Avoid walking on wet floors.  Keep items that you use a lot in easy-to-reach places.  If you need to reach something above you, use a strong step stool that has a grab bar.  Keep electrical cords out of the way.  Do not use floor polish or wax that makes floors slippery. If you must use wax, use non-skid floor wax.  Do not have throw rugs and other things on the floor that can make you trip. What can I do with my stairs?  Do not leave any items on the stairs.  Make sure that there are handrails on both sides of the stairs and use them. Fix handrails that are broken or loose. Make sure that handrails are as long as the stairways.  Check any carpeting to make sure that it is firmly attached to the stairs. Fix any carpet that is loose or worn.  Avoid having throw rugs at the top or bottom of the stairs. If you do have throw rugs, attach them to the floor with carpet  tape.  Make sure that you have a light switch at the top of the stairs and the bottom of the stairs. If you do not have them, ask someone to add them for you. What else can I do to help prevent falls?  Wear shoes that:  Do not have high heels.  Have rubber bottoms.  Are comfortable and fit you well.  Are closed at the toe. Do not wear sandals.  If you use a stepladder:  Make sure that it is fully opened. Do not climb a closed stepladder.  Make sure that both sides of the stepladder are locked into place.  Ask someone to hold it for you, if possible.  Clearly mark and make sure that you can see:  Any grab bars or handrails.  First and last steps.  Where the edge of each step is.  Use tools that help you move around (mobility aids) if they are needed. These include:  Canes.  Walkers.  Scooters.  Crutches.  Turn on the lights when you go into a dark area. Replace any light bulbs as soon as they burn out.  Set up your furniture so you have a clear path. Avoid moving your furniture around.  If any of your floors are uneven, fix them.  If there are any pets around you, be aware of where they are.  Review your medicines with your doctor. Some medicines can make you feel dizzy. This can increase your chance of falling. Ask your doctor what other things that you can do to help prevent falls. This information is not intended to replace advice given to you by your health care provider. Make sure you discuss any questions you have with your health care provider. Document Released: 11/20/2008 Document Revised: 07/02/2015 Document Reviewed: 02/28/2014 Elsevier Interactive Patient Education  2017 [employer].

## 2018-06-18 NOTE — Progress Notes (Signed)
Subjective:   Eileen Flores is a 83 y.o. female who presents for Medicare Annual (Subsequent) preventive examination.    This visit is being conducted through telemedicine due to the COVID-19 pandemic. This patient has given me verbal consent via doximity to conduct this visit, patient states they are participating from their home address. Some vital signs may be absent or patient reported.    Patient identification: identified by name, DOB, and current address  Review of Systems:  N/A        Objective:     Vitals: There were no vitals taken for this visit.  There is no height or weight on file to calculate BMI. Unable to obtain vitals due to visit being conducted via telephonically.    Advanced Directives 05/15/2017 05/05/2016  Does Patient Have a Medical Advance Directive? No No  Would patient like information on creating a medical advance directive? No - Patient declined Yes (ED - Information included in AVS)    Tobacco Social History   Tobacco Use  Smoking Status Former Smoker  . Types: Cigarettes  Smokeless Tobacco Never Used  Tobacco Comment   quit in mid to late 20's     Counseling given: Not Answered Comment: quit in mid to late 20's   Clinical Intake:                       Past Medical History:  Diagnosis Date  . Allergy   . Arthritis 1980  . Bowel trouble 1970  . 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 Cystitis 2008  . Hyperlipidemia   . IBS (irritable bowel syndrome)    since 1970's  . Intraductal papilloma of breast, right 01/2013   Right  . 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 Mammographic microcalcification   . Wears hearing aid    Past Surgical History:  Procedure Laterality Date  . BREAST BIOPSY Left 2014   stereo biopsy  . BREAST EXCISIONAL BIOPSY Right  2014   papilloma  . BREAST LUMPECTOMY Left 2014  . BREAST SURGERY Left Oct 2012   benign stereo biopsy  . BREAST SURGERY Right 01-14-13   excision breast mass, intraductal papilloma  . BREAST SURGERY Left 01-14-13   Excision intra-papillary carcinoma, DCIS, ER 90%, PR 90%.  . cataract surgery Bilateral 2009  . COLONOSCOPY  2011   Dr. Bary Castilla  . dermbrasion   1965  . OVARIAN CYST REMOVAL  1967  . POLYPECTOMY  2006  . THYROIDECTOMY  2006  . TONGUE SURGERY  2009   Family History  Problem Relation Age of Onset  . Colon cancer Other   . Colon polyps Other   . Cancer Mother 16       liver cancer  . Cancer Father 54       liver cancer  . Alcohol abuse Father   . Breast cancer Neg Hx    Social History   Socioeconomic History  . Marital status: Married    Spouse name: Not on file  . Number of children: 2  . Years of education: Not on file  . Highest education level: Some college, no degree  Occupational History  . Not on file  Social Needs  . Financial resource strain: Not hard at all  . Food insecurity:    Worry:  Never true    Inability: Never true  . Transportation needs:    Medical: No    Non-medical: No  Tobacco Use  . Smoking status: Former Smoker    Types: Cigarettes  . Smokeless tobacco: Never Used  . Tobacco comment: quit in mid to late 20's  Substance and Sexual Activity  . Alcohol use: No    Comment: rare- 1 drink  . Drug use: No  . Sexual activity: Not on file  Lifestyle  . Physical activity:    Days per week: Not on file    Minutes per session: Not on file  . Stress: Only a little  Relationships  . Social connections:    Talks on phone: Not on file    Gets together: Not on file    Attends religious service: Not on file    Active member of club or organization: Not on file    Attends meetings of clubs or organizations: Not on file    Relationship status: Not on file  Other Topics Concern  . Not on file  Social History Narrative  . Not on file     Outpatient Encounter Medications as of 06/18/2018  Medication Sig  . levothyroxine (SYNTHROID) 112 MCG tablet Take 1 tablet (112 mcg total) by mouth daily before breakfast.  . Melatonin 1 MG CAPS Take by mouth.  . timolol (TIMOPTIC) 0.5 % ophthalmic solution Place 1 drop into both eyes daily.    No facility-administered encounter medications on file as of 06/18/2018.     Activities of Daily Living No flowsheet data found.  Patient Care Team: Rubye Beach as PCP - General (Family Medicine) Bary Castilla, Forest Gleason, MD (General Surgery) Leandrew Koyanagi, MD as Referring Physician (Ophthalmology)    Assessment:   This is a routine wellness examination for Eileen Flores.  Exercise Activities and Dietary recommendations    Goals    . DIET - EAT MORE FRUITS AND VEGETABLES     Recommend increasing fruits and vegetables in diet to at least two servings of each a day.     . Increase water intake     Recommend increasing water intake to 4-5 glasses a day.       Fall Risk: Fall Risk  03/20/2018 05/15/2017 05/05/2016  Falls in the past year? 0 No No    FALL RISK PREVENTION PERTAINING TO THE HOME:  Any stairs in or around the home? Yes  If so, are there any without handrails? Yes   Home free of loose throw rugs in walkways, pet beds, electrical cords, etc? Yes  Adequate lighting in your home to reduce risk of falls? Yes   ASSISTIVE DEVICES UTILIZED TO PREVENT FALLS:  Life alert? No  Use of a cane, walker or w/c? Yes  Grab bars in the bathroom? No Shower chair or bench in shower? Yes  Elevated toilet seat or a handicapped toilet? No    TIMED UP AND GO:  Was the test performed? No .    Depression Screen PHQ 2/9 Scores 05/15/2017 05/15/2017 05/05/2016 05/05/2016  PHQ - 2 Score 0 0 0 0  PHQ- 9 Score 4 - 4 -     Cognitive Function     6CIT Screen 05/15/2017 05/05/2016  What Year? 0 points 0 points  What month? 0 points 0 points  What time? 0 points 0 points  Count  back from 20 0 points 0 points  Months in reverse 0 points 0 points  Repeat phrase 0 points 0  points  Total Score 0 0    Immunization History  Administered Date(s) Administered  . Influenza, High Dose Seasonal PF 11/21/2013, 11/05/2016, 11/28/2017  . Pneumococcal Conjugate-13 07/05/2017    Qualifies for Shingles Vaccine? Yes . Due for Shingrix. Education has been provided regarding the importance of this vaccine. Pt has been advised to call insurance company to determine out of pocket expense. Advised may also receive vaccine at local pharmacy or Health Dept. Verbalized acceptance and understanding.  Tdap: Pt declines today.   Flu Vaccine: Up to date  Pneumococcal Vaccine: Up to date on Prevnar 13. Pt states she has received the Penumovax 23 in the past. Requested records to be faxed to clinic.   Screening Tests Health Maintenance  Topic Date Due  . DEXA SCAN  09/16/2002  . TETANUS/TDAP  02/07/2026 (Originally 09/15/1954)  . PNA vac Low Risk Adult (2 of 2 - PPSV23) 07/06/2018  . INFLUENZA VACCINE  09/08/2018    Cancer Screenings:  Colorectal Screening: No longer required.   Mammogram: No longer required.   Bone Density: Ordered today. Pt provided with contact info and advised to call to schedule appt. Pt aware the office will call re: appt.  Lung Cancer Screening: (Low Dose CT Chest recommended if Age 37-80 years, 30 pack-year currently smoking OR have quit w/in 15years.) does not qualify.   Additional Screening:  Vision Screening: Recommended annual ophthalmology exams for early detection of glaucoma and other disorders of the eye.  Dental Screening: Recommended annual dental exams for proper oral hygiene  Community Resource Referral:  CRR required this visit?  No       Plan:  I have personally reviewed and addressed the Medicare Annual Wellness questionnaire and have noted the following in the patient's chart:  A. Medical and social history B. Use of alcohol,  tobacco or illicit drugs  C. Current medications and supplements D. Functional ability and status E.  Nutritional status F.  Physical activity G. Advance directives H. List of other physicians I.  Hospitalizations, surgeries, and ER visits in previous 12 months J.  [place] such as hearing and vision if needed, cognitive and depression L. Referrals and appointments   In addition, I have reviewed and discussed with patient certain preventive protocols, quality metrics, and best practice recommendations. A written personalized care plan for preventive services as well as general preventive health recommendations were provided to patient. Nurse Health Advisor  Signed,    Nihira Puello [place], [place]  09/16/1749 Nurse Health Advisor   Nurse Notes: Pt declined future order for a tetanus vaccine.

## 2018-07-09 DIAGNOSIS — H40003 Preglaucoma, unspecified, bilateral: Secondary | ICD-10-CM | POA: Diagnosis not present

## 2018-07-16 DIAGNOSIS — H903 Sensorineural hearing loss, bilateral: Secondary | ICD-10-CM | POA: Diagnosis not present

## 2018-07-16 DIAGNOSIS — H6123 Impacted cerumen, bilateral: Secondary | ICD-10-CM | POA: Diagnosis not present

## 2018-07-31 ENCOUNTER — Other Ambulatory Visit: Payer: Medicare HMO

## 2018-08-31 ENCOUNTER — Encounter: Payer: Self-pay | Admitting: General Surgery

## 2018-09-24 ENCOUNTER — Encounter: Payer: Medicare HMO | Admitting: Physician Assistant

## 2018-09-26 ENCOUNTER — Encounter: Payer: Medicare HMO | Admitting: Physician Assistant

## 2018-10-11 DIAGNOSIS — R69 Illness, unspecified: Secondary | ICD-10-CM | POA: Diagnosis not present

## 2018-10-22 ENCOUNTER — Ambulatory Visit (INDEPENDENT_AMBULATORY_CARE_PROVIDER_SITE_OTHER): Payer: Medicare HMO | Admitting: Physician Assistant

## 2018-10-22 ENCOUNTER — Encounter: Payer: Self-pay | Admitting: Physician Assistant

## 2018-10-22 VITALS — BP 163/89 | HR 66 | Temp 97.3°F | Resp 16 | Ht 68.0 in | Wt 215.2 lb

## 2018-10-22 DIAGNOSIS — C50912 Malignant neoplasm of unspecified site of left female breast: Secondary | ICD-10-CM

## 2018-10-22 DIAGNOSIS — Z780 Asymptomatic menopausal state: Secondary | ICD-10-CM | POA: Diagnosis not present

## 2018-10-22 DIAGNOSIS — Z0000 Encounter for general adult medical examination without abnormal findings: Secondary | ICD-10-CM | POA: Diagnosis not present

## 2018-10-22 DIAGNOSIS — Z23 Encounter for immunization: Secondary | ICD-10-CM

## 2018-10-22 DIAGNOSIS — N951 Menopausal and female climacteric states: Secondary | ICD-10-CM | POA: Diagnosis not present

## 2018-10-22 DIAGNOSIS — R7301 Impaired fasting glucose: Secondary | ICD-10-CM | POA: Diagnosis not present

## 2018-10-22 DIAGNOSIS — E039 Hypothyroidism, unspecified: Secondary | ICD-10-CM

## 2018-10-22 DIAGNOSIS — E7800 Pure hypercholesterolemia, unspecified: Secondary | ICD-10-CM | POA: Diagnosis not present

## 2018-10-22 NOTE — Patient Instructions (Signed)
Health Maintenance After Age 81 After age 81, you are at a higher risk for certain long-term diseases and infections as well as injuries from falls. Falls are a major cause of broken bones and head injuries in people who are older than age 81. Getting regular preventive care can help to keep you healthy and well. Preventive care includes getting regular testing and making lifestyle changes as recommended by your health care provider. Talk with your health care provider about:  Which screenings and tests you should have. A screening is a test that checks for a disease when you have no symptoms.  A diet and exercise plan that is right for you. What should I know about screenings and tests to prevent falls? Screening and testing are the best ways to find a health problem early. Early diagnosis and treatment give you the best chance of managing medical conditions that are common after age 81. Certain conditions and lifestyle choices may make you more likely to have a fall. Your health care provider may recommend:  Regular vision checks. Poor vision and conditions such as cataracts can make you more likely to have a fall. If you wear glasses, make sure to get your prescription updated if your vision changes.  Medicine review. Work with your health care provider to regularly review all of the medicines you are taking, including over-the-counter medicines. Ask your health care provider about any side effects that may make you more likely to have a fall. Tell your health care provider if any medicines that you take make you feel dizzy or sleepy.  Osteoporosis screening. Osteoporosis is a condition that causes the bones to get weaker. This can make the bones weak and cause them to break more easily.  Blood pressure screening. Blood pressure changes and medicines to control blood pressure can make you feel dizzy.  Strength and balance checks. Your health care provider may recommend certain tests to check your  strength and balance while standing, walking, or changing positions.  Foot health exam. Foot pain and numbness, as well as not wearing proper footwear, can make you more likely to have a fall.  Depression screening. You may be more likely to have a fall if you have a fear of falling, feel emotionally low, or feel unable to do activities that you used to do.  Alcohol use screening. Using too much alcohol can affect your balance and may make you more likely to have a fall. What actions can I take to lower my risk of falls? General instructions  Talk with your health care provider about your risks for falling. Tell your health care provider if: ? You fall. Be sure to tell your health care provider about all falls, even ones that seem minor. ? You feel dizzy, sleepy, or off-balance.  Take over-the-counter and prescription medicines only as told by your health care provider. These include any supplements.  Eat a healthy diet and maintain a healthy weight. A healthy diet includes low-fat dairy products, low-fat (lean) meats, and fiber from whole grains, beans, and lots of fruits and vegetables. Home safety  Remove any tripping hazards, such as rugs, cords, and clutter.  Install safety equipment such as grab bars in bathrooms and safety rails on stairs.  Keep rooms and walkways well-lit. Activity   Follow a regular exercise program to stay fit. This will help you maintain your balance. Ask your health care provider what types of exercise are appropriate for you.  If you need a cane or   walker, use it as recommended by your health care provider.  Wear supportive shoes that have nonskid soles. Lifestyle  Do not drink alcohol if your health care provider tells you not to drink.  If you drink alcohol, limit how much you have: ? 0-1 drink a day for women. ? 0-2 drinks a day for men.  Be aware of how much alcohol is in your drink. In the [place] one drink equals one typical bottle of beer (12  oz), one-half glass of wine (5 oz), or one shot of hard liquor (1 oz).  Do not use any products that contain nicotine or tobacco, such as cigarettes and e-cigarettes. If you need help quitting, ask your health care provider. Summary  Having a healthy lifestyle and getting preventive care can help to protect your health and wellness after age 81.  Screening and testing are the best way to find a health problem early and help you avoid having a fall. Early diagnosis and treatment give you the best chance for managing medical conditions that are more common for people who are older than age 81.  Falls are a major cause of broken bones and head injuries in people who are older than age 81. Take precautions to prevent a fall at home.  Work with your health care provider to learn what changes you can make to improve your health and wellness and to prevent falls. This information is not intended to replace advice given to you by your health care provider. Make sure you discuss any questions you have with your health care provider. Document Released: 12/07/2016 Document Revised: 05/17/2018 Document Reviewed: 12/07/2016 Elsevier Patient Education  2020 [employer]  

## 2018-10-22 NOTE — Progress Notes (Signed)
Patient: Eileen Flores, Female    DOB: May 30, 1935, 83 y.o.   MRN: [id number] Visit Date: 10/22/2018  Today's Provider: Mar Daring, PA-C   Chief Complaint  Patient presents with  . Annual Exam   Subjective:  I,Joseline E. Rosas,RMA am acting as a Education administrator for [employer], PA-C.  Patient had AWV 06/18/2018 with [facility].   Complete Physical SAADIYA SECUNDINO is a 83 y.o. female. She feels well. She reports exercising none. She reports she is sleeping fairly well. -----------------------------------------------------------   Review of Systems  Constitutional: Positive for fatigue.  HENT: Positive for postnasal drip, rhinorrhea, sneezing and sore throat.   Eyes: Positive for redness and itching.  Respiratory: Positive for cough.   Cardiovascular: Positive for leg swelling.  Gastrointestinal: Positive for diarrhea.  Endocrine: Negative.   Genitourinary: Negative.   Musculoskeletal: Positive for arthralgias.  Skin: Negative.   Allergic/Immunologic: Positive for environmental allergies and food allergies.  Neurological: Positive for headaches.  Hematological: Negative.   Psychiatric/Behavioral: Positive for sleep disturbance. The patient is nervous/anxious.   Positive ROS are chronic per patient  Social History   Socioeconomic History  . Marital status: Married    Spouse name: Not on file  . Number of children: 2  . Years of education: Not on file  . Highest education level: Some college, no degree  Occupational History  . Not on file  Social Needs  . Financial resource strain: Not hard at all  . Food insecurity    Worry: Never true    Inability: Never true  . Transportation needs    Medical: No    Non-medical: No  Tobacco Use  . Smoking status: Former Smoker    Types: Cigarettes  . Smokeless tobacco: Never Used  . Tobacco comment: quit in mid to late 20's  Substance and Sexual Activity  . Alcohol use: No    Comment: rare- 1 drink  . Drug use:  No  . Sexual activity: Not on file  Lifestyle  . Physical activity    Days per week: 0 days    Minutes per session: 0 min  . Stress: Rather much  Relationships  . Social Herbalist on phone: Patient refused    Gets together: Patient refused    Attends religious service: Patient refused    Active member of club or organization: Patient refused    Attends meetings of clubs or organizations: Patient refused    Relationship status: Patient refused  . Intimate partner violence    Fear of current or ex partner: Patient refused    Emotionally abused: Patient refused    Physically abused: Patient refused    Forced sexual activity: Patient refused  Other Topics Concern  . Not on file  Social History Narrative  . Not on file    Past Medical History:  Diagnosis Date  . Allergy   . Arthritis 1980  . Bowel trouble 1970  . 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 Cystitis 2008  . Hyperlipidemia   . IBS (irritable bowel syndrome)    since 1970's  . Intraductal papilloma of breast, right 01/2013   Right  . 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 Mammographic microcalcification   . Wears hearing aid      Patient Active Problem  List   Diagnosis Date Noted  . Hyperlipidemia 06/14/2017  . GERD (gastroesophageal reflux disease) 05/05/2016  . Anxiety 11/16/2015  . Arthritis 11/16/2015  . Bunion 11/16/2015  . Bursitis 11/16/2015  . Colon polyp 11/16/2015  . DD (diverticular disease) 11/16/2015  . Hammer toe 11/16/2015  . Acquired hypothyroidism 11/16/2015  . Tendinitis 11/16/2015  . DCIS (ductal carcinoma in situ) of breast 01/17/2013  . Papilloma of breast 01/17/2013  . Breast neoplasm, Tis (DCIS), left 01/07/2013    Past Surgical History:  Procedure Laterality Date  . BREAST BIOPSY Left 2014   stereo  biopsy  . BREAST EXCISIONAL BIOPSY Right 2014   papilloma  . BREAST LUMPECTOMY Left 2014  . BREAST SURGERY Left Oct 2012   benign stereo biopsy  . BREAST SURGERY Right 01-14-13   excision breast mass, intraductal papilloma  . BREAST SURGERY Left 01-14-13   Excision intra-papillary carcinoma, DCIS, ER 90%, PR 90%.  . cataract surgery Bilateral 2009  . COLONOSCOPY  2011   Dr. Bary Castilla  . dermbrasion   1965  . OVARIAN CYST REMOVAL  1967  . POLYPECTOMY  2006  . THYROIDECTOMY  2006  . TONGUE SURGERY  2009    Her family history includes Alcohol abuse in her father; Cancer (age of onset: 14) in her father; Cancer (age of onset: 59) in her mother; Colon cancer in an other family member; Colon polyps in an other family member. There is no history of Breast cancer.   Current Outpatient Medications:  .  levothyroxine (SYNTHROID) 112 MCG tablet, Take 1 tablet (112 mcg total) by mouth daily before breakfast., Disp: 90 tablet, Rfl: 3 .  Melatonin 1 MG CAPS, Take by mouth., Disp: , Rfl:  .  timolol (TIMOPTIC) 0.5 % ophthalmic solution, Place 1 drop into both eyes daily. , Disp: , Rfl:   Patient Care Team: Mar Daring, PA-C as PCP - General (Family Medicine) Bary Castilla Forest Gleason, MD (General Surgery) Leandrew Koyanagi, MD as Referring Physician (Ophthalmology) Margaretha Sheffield, MD (Otolaryngology)     Objective:    Vitals: BP (!) 163/89 (BP Location: Left Wrist, Patient Position: Sitting, Cuff Size: Large)   Pulse 66   Temp (!) 97.3 F (36.3 C) (Other (Comment)) Comment (Src): forehead  Resp 16   Ht 5\' 8"  (1.727 m)   Wt 215 lb 3.2 oz (97.6 kg)   BMI 32.72 kg/m   Physical Exam Vitals signs reviewed.  Constitutional:      General: She is not in acute distress.    Appearance: Normal appearance. She is well-developed. She is obese. She is not ill-appearing or diaphoretic.  HENT:     Head: Normocephalic and atraumatic.     Right Ear: Tympanic membrane, ear canal and external ear  normal.     Left Ear: Tympanic membrane, ear canal and external ear normal.     Nose: Nose normal.     Mouth/Throat:     Mouth: Mucous membranes are moist.     Pharynx: Oropharynx is clear. No oropharyngeal exudate.  Eyes:     General: No scleral icterus.       Right eye: No discharge.        Left eye: No discharge.     Extraocular Movements: Extraocular movements intact.     Conjunctiva/sclera: Conjunctivae normal.     Pupils: Pupils are equal, round, and reactive to light.  Neck:     Musculoskeletal: Normal range of motion and neck supple.     Thyroid: No thyromegaly.  Vascular: No carotid bruit or JVD.     Trachea: No tracheal deviation.  Cardiovascular:     Rate and Rhythm: Normal rate and regular rhythm.     Pulses: Normal pulses.     Heart sounds: Normal heart sounds. No murmur. No friction rub. No gallop.   Pulmonary:     Effort: Pulmonary effort is normal. No respiratory distress.     Breath sounds: Normal breath sounds. No wheezing or rales.  Chest:     Chest wall: No tenderness.  Abdominal:     General: Abdomen is flat. Bowel sounds are normal. There is no distension.     Palpations: Abdomen is soft. There is no mass.     Tenderness: There is no abdominal tenderness. There is no guarding or rebound.  Musculoskeletal: Normal range of motion.        General: No tenderness.     Right lower leg: No edema.     Left lower leg: No edema.  Lymphadenopathy:     Cervical: No cervical adenopathy.  Skin:    General: Skin is warm and dry.     Capillary Refill: Capillary refill takes less than 2 seconds.     Findings: No rash.  Neurological:     General: No focal deficit present.     Mental Status: She is alert and oriented to person, place, and time. Mental status is at baseline.  Psychiatric:        Mood and Affect: Mood normal.        Behavior: Behavior normal.        Thought Content: Thought content normal.        Judgment: Judgment normal.     Activities of  Daily Living In your present state of health, do you have any difficulty performing the following activities: 06/18/2018  Hearing? N  Comment Wears bilateral hearing aids.   Vision? N  Comment Wears eye glasses daily.   Difficulty concentrating or making decisions? N  Walking or climbing stairs? N  Comment Due to knee pains.   Dressing or bathing? N  Doing errands, shopping? N  Preparing Food and eating ? N  Using the Toilet? N  In the past six months, have you accidently leaked urine? Y  Comment Wears protection daily.   Do you have problems with loss of bowel control? N  Managing your Medications? N  Managing your Finances? N  Housekeeping or managing your Housekeeping? N  Some recent data might be hidden    Fall Risk Assessment Fall Risk  06/18/2018 03/20/2018 05/15/2017 05/05/2016  Falls in the past year? 1 0 No No  Number falls in past yr: 0 - - -  Injury with Fall? 0 - - -  Follow up Falls prevention discussed - - -     Depression Screen PHQ 2/9 Scores 06/18/2018 06/18/2018 05/15/2017 05/15/2017  PHQ - 2 Score 1 1 0 0  PHQ- 9 Score 1 - 4 -    6CIT Screen 06/18/2018  What Year? 0 points  What month? 0 points  What time? 0 points  Count back from 20 0 points  Months in reverse 0 points  Repeat phrase 0 points  Total Score 0       Assessment & Plan:    Annual Physical Reviewed patient's Family Medical History Reviewed and updated list of patient's medical providers Assessment of cognitive impairment was done Assessed patient's functional ability Established a written schedule for health screening [place] Completed  and Reviewed  Exercise Activities and Dietary recommendations Goals    . DIET - EAT MORE FRUITS AND VEGETABLES     Recommend increasing fruits and vegetables in diet to at least two servings of each a day.     . Exercise 3x per week (30 min per time)     Recommend to exercise for 3 days a week for at least 30 minutes at a time.      . Increase water intake     Recommend increasing water intake to 4-5 glasses a day.    [name] LIFESTYLE - DECREASE FALLS RISK     Recommend to remove any items from the home that may cause slips or trips.       Immunization History  Administered Date(s) Administered  . Influenza, High Dose Seasonal PF 11/21/2013, 11/05/2016, 11/28/2017  . Pneumococcal Conjugate-13 07/05/2017    Health Maintenance  Topic Date Due  . DEXA SCAN  09/16/2002  . PNA vac Low Risk Adult (2 of 2 - PPSV23) 07/06/2018  . INFLUENZA VACCINE  09/08/2018  . TETANUS/TDAP  02/07/2026 (Originally 09/15/1954)     Discussed health benefits of physical activity, and encouraged her to engage in regular exercise appropriate for her age and condition.    1. Annual physical exam Normal physical exam today. Will check labs as below and f/u pending lab results. If labs are stable and WNL she will not need to have these rechecked for one year at her next annual physical exam. She is to call the office in the meantime if she has any acute issue, questions or concerns. - CBC with Differential/Platelet - Comprehensive metabolic panel - Hemoglobin A1c - Lipid panel - TSH - Vitamin D (25 hydroxy)  2. Acquired hypothyroidism Stable. Conintue levothyroxine 135mcg. Will check labs as below and f/u pending results. - TSH  3. Pure hypercholesterolemia Stable. Diet controlled. Will check labs as below and f/u pending results. - Comprehensive metabolic panel - Lipid panel  4. Postmenopausal estrogen deficiency Having some fatigue. Has been indoors and not exercising as much secondary to covid 19. Will check labs as below and f/u pending results. - Vitamin D (25 hydroxy)  5. Need for influenza vaccination Flu vaccine given today without complication. Patient sat upright for 15 minutes to check for adverse reaction before being released. - Flu Vaccine QUAD High Dose(Fluad)  6. Malignant neoplasm of left female breast,  unspecified estrogen receptor status, unspecified site of breast (HCC) Left DCIS, s/p lumpectomy. Followed by Dr. Bary Castilla. Given information for his new clinic. Needs to schedule for Dec 2020.  ------------------------------------------------------------------------------------------------------------    Mar Daring, PA-C  [facility]

## 2018-10-23 LAB — COMPREHENSIVE METABOLIC PANEL
ALT: 10 IU/L (ref 0–32)
AST: 18 IU/L (ref 0–40)
Albumin/Globulin Ratio: 1.2 (ref 1.2–2.2)
Albumin: 3.9 g/dL (ref 3.6–4.6)
Alkaline Phosphatase: 84 IU/L (ref 39–117)
BUN/Creatinine Ratio: 16 (ref 12–28)
BUN: 16 mg/dL (ref 8–27)
Bilirubin Total: 0.2 mg/dL (ref 0.0–1.2)
CO2: 24 mmol/L (ref 20–29)
Calcium: 9.2 mg/dL (ref 8.7–10.3)
Chloride: 100 mmol/L (ref 96–106)
Creatinine, Ser: 0.97 mg/dL (ref 0.57–1.00)
GFR calc Af Amer: 63 mL/min/{1.73_m2} (ref >59)
GFR calc non Af Amer: 55 mL/min/{1.73_m2} — ABNORMAL LOW (ref >59)
Globulin, Total: 3.2 g/dL (ref 1.5–4.5)
Glucose: 107 mg/dL — ABNORMAL HIGH (ref 65–99)
Potassium: 4.7 mmol/L (ref 3.5–5.2)
Sodium: 139 mmol/L (ref 134–144)
Total Protein: 7.1 g/dL (ref 6.0–8.5)

## 2018-10-23 LAB — LIPID PANEL
Chol/HDL Ratio: 4.6 ratio — ABNORMAL HIGH (ref 0.0–4.4)
Cholesterol, Total: 217 mg/dL — ABNORMAL HIGH (ref 100–199)
HDL: 47 mg/dL (ref >39)
LDL Chol Calc (NIH): 135 mg/dL — ABNORMAL HIGH (ref 0–99)
Triglycerides: 194 mg/dL — ABNORMAL HIGH (ref 0–149)
VLDL Cholesterol Cal: 35 mg/dL (ref 5–40)

## 2018-10-23 LAB — CBC WITH DIFFERENTIAL/PLATELET
Basophils Absolute: 0.1 10*3/uL (ref 0.0–0.2)
Basos: 1 %
EOS (ABSOLUTE): 0.2 10*3/uL (ref 0.0–0.4)
Eos: 2 %
Hematocrit: 44.5 % (ref 34.0–46.6)
Hemoglobin: 14.5 g/dL (ref 11.1–15.9)
Immature Grans (Abs): 0 10*3/uL (ref 0.0–0.1)
Immature Granulocytes: 0 %
Lymphocytes Absolute: 2.1 10*3/uL (ref 0.7–3.1)
Lymphs: 23 %
MCH: 30.7 pg (ref 26.6–33.0)
MCHC: 32.6 g/dL (ref 31.5–35.7)
MCV: 94 fL (ref 79–97)
Monocytes Absolute: 0.8 10*3/uL (ref 0.1–0.9)
Monocytes: 9 %
Neutrophils Absolute: 5.9 10*3/uL (ref 1.4–7.0)
Neutrophils: 65 %
Platelets: 306 10*3/uL (ref 150–450)
RBC: 4.73 x10E6/uL (ref 3.77–5.28)
RDW: 13 % (ref 11.7–15.4)
WBC: 9.1 10*3/uL (ref 3.4–10.8)

## 2018-10-23 LAB — HEMOGLOBIN A1C
Est. average glucose Bld gHb Est-mCnc: 114 mg/dL
Hgb A1c MFr Bld: 5.6 % (ref 4.8–5.6)

## 2018-10-23 LAB — TSH: TSH: 1.21 u[IU]/mL (ref 0.450–4.500)

## 2018-10-23 LAB — VITAMIN D 25 HYDROXY (VIT D DEFICIENCY, FRACTURES): Vit D, 25-Hydroxy: 21.4 ng/mL — ABNORMAL LOW (ref 30.0–100.0)

## 2018-11-28 ENCOUNTER — Other Ambulatory Visit: Payer: Self-pay | Admitting: General Surgery

## 2018-11-28 DIAGNOSIS — Z1231 Encounter for screening mammogram for malignant neoplasm of breast: Secondary | ICD-10-CM

## 2018-11-29 ENCOUNTER — Other Ambulatory Visit: Payer: Self-pay | Admitting: General Surgery

## 2018-11-29 DIAGNOSIS — Z1231 Encounter for screening mammogram for malignant neoplasm of breast: Secondary | ICD-10-CM

## 2018-12-06 ENCOUNTER — Telehealth: Payer: Self-pay | Admitting: Physician Assistant

## 2018-12-06 MED ORDER — MELOXICAM 15 MG PO TABS: 15.0000 mg | ORAL_TABLET | Freq: Every day | ORAL | 0 refills | Status: DC | PRN

## 2018-12-06 NOTE — Telephone Encounter (Signed)
Pt would like to know if she can get a refill or rx for Mobic.  She has arthritis pain and she has taken this before.  [facility]  CB#  [phone] or [phone]  [place]

## 2018-12-06 NOTE — Telephone Encounter (Signed)
Please advise 

## 2018-12-06 NOTE — Telephone Encounter (Signed)
Rx sent 

## 2018-12-07 MED ORDER — MELOXICAM 15 MG PO TABS: 15.0000 mg | ORAL_TABLET | Freq: Every day | ORAL | 0 refills | Status: DC | PRN

## 2018-12-07 NOTE — Telephone Encounter (Signed)
[facility] saying [facility] [place] told [facility] they didn't get the rx because of the power outage that they had yesterday..  Can we please resend prescription for Mobic  [place]

## 2018-12-10 ENCOUNTER — Telehealth: Payer: Self-pay

## 2018-12-10 NOTE — Telephone Encounter (Signed)
Patient called and states that she is transferring her care to Dr Bary Castilla.

## 2019-01-08 DIAGNOSIS — H40003 Preglaucoma, unspecified, bilateral: Secondary | ICD-10-CM | POA: Diagnosis not present

## 2019-01-17 DIAGNOSIS — H40003 Preglaucoma, unspecified, bilateral: Secondary | ICD-10-CM | POA: Diagnosis not present

## 2019-01-28 ENCOUNTER — Ambulatory Visit
Admission: RE | Admit: 2019-01-28 | Discharge: 2019-01-28 | Disposition: A | Discharge status: Home / Self Care | Payer: Medicare HMO | Source: Ambulatory Visit | Attending: General Surgery | Admitting: General Surgery

## 2019-01-28 DIAGNOSIS — Z1231 Encounter for screening mammogram for malignant neoplasm of breast: Secondary | ICD-10-CM | POA: Diagnosis not present

## 2019-01-29 ENCOUNTER — Other Ambulatory Visit: Payer: Self-pay | Admitting: Family Medicine

## 2019-01-30 ENCOUNTER — Other Ambulatory Visit: Payer: Self-pay | Admitting: General Surgery

## 2019-01-30 DIAGNOSIS — R928 Other abnormal and inconclusive findings on diagnostic imaging of breast: Secondary | ICD-10-CM

## 2019-01-30 DIAGNOSIS — N6320 Unspecified lump in the left breast, unspecified quadrant: Secondary | ICD-10-CM

## 2019-02-11 ENCOUNTER — Ambulatory Visit
Admission: RE | Admit: 2019-02-11 | Discharge: 2019-02-11 | Disposition: A | Discharge status: Home / Self Care | Payer: Medicare HMO | Source: Ambulatory Visit | Attending: General Surgery | Admitting: General Surgery

## 2019-02-11 DIAGNOSIS — N6325 Unspecified lump in the left breast, overlapping quadrants: Secondary | ICD-10-CM | POA: Diagnosis not present

## 2019-02-11 DIAGNOSIS — R928 Other abnormal and inconclusive findings on diagnostic imaging of breast: Secondary | ICD-10-CM | POA: Diagnosis not present

## 2019-02-11 DIAGNOSIS — N6320 Unspecified lump in the left breast, unspecified quadrant: Secondary | ICD-10-CM

## 2019-02-14 DIAGNOSIS — Z853 Personal history of malignant neoplasm of breast: Secondary | ICD-10-CM | POA: Diagnosis not present

## 2019-04-29 ENCOUNTER — Telehealth: Payer: Self-pay

## 2019-04-29 NOTE — Telephone Encounter (Signed)
Copied from [place] #[id number]. Topic: General - Other >> Apr 29, 2019  1:06 PM Rainey Pines A wrote: Patient would like a callback from [facility] or nurse on tomorrow in regards to a medication she is taking and her covid shot. Please advise

## 2019-04-29 NOTE — Telephone Encounter (Signed)
LMTCB

## 2019-04-29 NOTE — Telephone Encounter (Signed)
Spoke with patient and she asked what would be the side effect after getting the covid vaccine. Told patient that some of the common ones is pain in the injection site, redness,swelling and also some have experienced tiredness,some headache,muscle ache and mild temperature.

## 2019-05-06 DIAGNOSIS — R69 Illness, unspecified: Secondary | ICD-10-CM | POA: Diagnosis not present

## 2019-05-15 DIAGNOSIS — E890 Postprocedural hypothyroidism: Secondary | ICD-10-CM | POA: Diagnosis not present

## 2019-05-17 DIAGNOSIS — H6123 Impacted cerumen, bilateral: Secondary | ICD-10-CM | POA: Diagnosis not present

## 2019-05-17 DIAGNOSIS — E890 Postprocedural hypothyroidism: Secondary | ICD-10-CM | POA: Diagnosis not present

## 2019-06-18 NOTE — Progress Notes (Signed)
Subjective:   Eileen Flores is a 84 y.o. female who presents for Medicare Annual (Subsequent) preventive examination.  This visit is being conducted via phone call  - after an attmept to do on video chat - due to the COVID-19 pandemic. This patient has given me verbal consent via phone to conduct this visit, patient states they are participating from their home address. Some vital signs may be absent or patient reported.    Patient identification: identified by name, DOB, and current address.  Review of Systems:  N/A  Cardiac Risk Factors include: advanced age (>16men, >57 women);obesity (BMI >30kg/m2)     Objective:     Vitals: There were no vitals taken for this visit.  There is no height or weight on file to calculate BMI.  Advanced Directives 06/19/2019 06/18/2018 05/15/2017 05/05/2016  Does Patient Have a Medical Advance Directive? No No No No  Would patient like information on creating a medical advance directive? No - Patient declined No - Patient declined No - Patient declined Yes (ED - Information included in AVS)    Tobacco Social History   Tobacco Use  Smoking Status Former Smoker  . Types: Cigarettes  Smokeless Tobacco Never Used  Tobacco Comment   quit in mid to late 20's     Counseling given: Not Answered Comment: quit in mid to late 20's   Clinical Intake:  Pre-visit preparation completed: Yes  Pain : No/denies pain     Nutritional Risks: None Diabetes: No  How often do you need to have someone help you when you read instructions, pamphlets, or other written materials from your doctor or pharmacy?: 1 - Never  Interpreter Needed?: No  Information entered by :: [facility], LPN  Past Medical History:  Diagnosis Date  . Allergy   . Arthritis 1980  . Bowel trouble 1970  . 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 Cystitis 2008  . Hyperlipidemia   . IBS (irritable bowel syndrome)    since 1970's  . Intraductal papilloma  of breast, right 01/2013   Right  . 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 Mammographic microcalcification   . Wears hearing aid    Past Surgical History:  Procedure Laterality Date  . BREAST BIOPSY Left 2014   stereo biopsy  . BREAST EXCISIONAL BIOPSY Right 2014   papilloma  . BREAST LUMPECTOMY Left 2014  . BREAST SURGERY Left Oct 2012   benign stereo biopsy  . BREAST SURGERY Right 01-14-13   excision breast mass, intraductal papilloma  . BREAST SURGERY Left 01-14-13   Excision intra-papillary carcinoma, DCIS, ER 90%, PR 90%.  . cataract surgery Bilateral 2009  . COLONOSCOPY  2011   Dr. Bary Castilla  . dermbrasion   1965  . OVARIAN CYST REMOVAL  1967  . POLYPECTOMY  2006  . THYROIDECTOMY  2006  . TONGUE SURGERY  2009   Family History  Problem Relation Age of Onset  . Colon cancer Other   . Colon polyps Other   . Cancer Mother 51       liver cancer  . Cancer Father 63       liver cancer  . Alcohol abuse Father   . Breast cancer Neg Hx    Social History   Socioeconomic History  . Marital status: Widowed    Spouse  name: Not on file  . Number of children: 2  . Years of education: Not on file  . Highest education level: Some college, no degree  Occupational History  . Not on file  Tobacco Use  . Smoking status: Former Smoker    Types: Cigarettes  . Smokeless tobacco: Never Used  . Tobacco comment: quit in mid to late 20's  Substance and Sexual Activity  . Alcohol use: No    Comment: rare- 1 drink  . Drug use: No  . Sexual activity: Not on file  Other Topics Concern  . Not on file  Social History Narrative  . Not on file   Social Determinants of Health   Financial Resource Strain: Low Risk   . Difficulty of Paying Living Expenses: Not hard at all  Food Insecurity: No Food Insecurity  . Worried About Charity fundraiser  in the Last Year: Never true  . Ran Out of Food in the Last Year: Never true  Transportation Needs: No Transportation Needs  . Lack of Transportation (Medical): No  . Lack of Transportation (Non-Medical): No  Physical Activity: Inactive  . Days of Exercise per Week: 0 days  . Minutes of Exercise per Session: 0 min  Stress: No Stress Concern Present  . Feeling of Stress : Only a little  Social Connections: Somewhat Isolated  . Frequency of Communication with Friends and Family: More than three times a week  . Frequency of Social Gatherings with Friends and Family: Never  . Attends Religious Services: Never  . Active Member of Clubs or Organizations: Yes  . Attends Archivist Meetings: Never  . Marital Status: Widowed    Outpatient Encounter Medications as of 06/19/2019  Medication Sig  . Cholecalciferol (VITAMIN D3) 50 MCG (2000 UT) capsule Take 2,000 Units by mouth daily.  [name] levothyroxine (SYNTHROID) 112 MCG tablet Take 1 tablet (112 mcg total) by mouth daily before breakfast.  . Melatonin 1 MG CAPS Take by mouth.  . meloxicam (MOBIC) 15 MG tablet TAKE 1 TABLET BY MOUTH EVERY DAY AS NEEDED FOR PAIN  . timolol (TIMOPTIC) 0.5 % ophthalmic solution Place 1 drop into both eyes daily.    No facility-administered encounter medications on file as of 06/19/2019.    Activities of Daily Living In your present state of health, do you have any difficulty performing the following activities: 06/19/2019  Hearing? N  Comment Wears bilateral hearing aids.  Vision? Y  Comment Has a new eye glass prescription but needs to get a new pair of glasses.  Difficulty concentrating or making decisions? N  Walking or climbing stairs? N  Dressing or bathing? N  Doing errands, shopping? N  Preparing Food and eating ? N  Using the Toilet? N  In the past six months, have you accidently leaked urine? Y  Comment Only at night, wears protection.  Do you have problems with loss of bowel control? N    Managing your Medications? N  Managing your Finances? N  Housekeeping or managing your Housekeeping? N  Some recent data might be hidden    Patient Care Team: Mar Daring, PA-C as PCP - General (Family Medicine) Bary Castilla, Forest Gleason, MD (General Surgery) Leandrew Koyanagi, MD as Referring Physician (Ophthalmology) Margaretha Sheffield, MD (Otolaryngology)    Assessment:   This is a routine wellness examination for Juliette.  Exercise Activities and Dietary recommendations Current Exercise Habits: The patient does not participate in regular exercise at present, Exercise limited by: Other -  see comments(arthritis)  Goals    . DIET - EAT MORE FRUITS AND VEGETABLES     Recommend increasing fruits and vegetables in diet to at least two servings of each a day.     . Exercise 3x per week (30 min per time)     Recommend to exercise for 3 days a week for at least 30 minutes at a time.     . Increase water intake     Recommend increasing water intake to 4-5 glasses a day.       Fall Risk: Fall Risk  06/19/2019 06/18/2018 03/20/2018 05/15/2017 05/05/2016  Falls in the past year? 0 1 0 No No  Number falls in past yr: 0 0 - - -  Injury with Fall? 0 0 - - -  Follow up - Falls prevention discussed - - -    FALL RISK PREVENTION PERTAINING TO THE HOME:  Any stairs in or around the home? Yes  If so, are there any without handrails? No   Home free of loose throw rugs in walkways, pet beds, electrical cords, etc? Yes  Adequate lighting in your home to reduce risk of falls? Yes   ASSISTIVE DEVICES UTILIZED TO PREVENT FALLS:  Life alert? No  Use of a cane, walker or w/c? Yes  Grab bars in the bathroom? No  Shower chair or bench in shower? Yes  Elevated toilet seat or a handicapped toilet? No    TIMED UP AND GO:  Was the test performed? No .    Depression Screen PHQ 2/9 Scores 06/19/2019 06/18/2018 06/18/2018 05/15/2017  PHQ - 2 Score 1 1 1  0  PHQ- 9 Score - 1 - 4     Cognitive  Function     6CIT Screen 06/19/2019 06/18/2018 05/15/2017 05/05/2016  What Year? 0 points 0 points 0 points 0 points  What month? 0 points 0 points 0 points 0 points  What time? 0 points 0 points 0 points 0 points  Count back from 20 0 points 0 points 0 points 0 points  Months in reverse 0 points 0 points 0 points 0 points  Repeat phrase 0 points 0 points 0 points 0 points  Total Score 0 0 0 0    Immunization History  Administered Date(s) Administered  . Fluad Quad(high Dose 65+) 10/22/2018  . Influenza, High Dose Seasonal PF 11/21/2013, 11/05/2016, 11/28/2017  . Pneumococcal Conjugate-13 07/05/2017  . Pneumococcal-Unspecified 01/14/2013    Qualifies for Shingles Vaccine? Yes . Due for Shingrix. Pt has been advised to call insurance company to determine out of pocket expense. Advised may also receive vaccine at local pharmacy or Health Dept. Verbalized acceptance and understanding.  Tdap: Although this vaccine is not a covered service during a Wellness Exam, does the patient still wish to receive this vaccine today?  No . Advised may receive this vaccine at local pharmacy or Health Dept. Aware to provide a copy of the vaccination record if obtained from local pharmacy or Health Dept. Verbalized acceptance and understanding.  Flu Vaccine: Up to date  Pneumococcal Vaccine: Completed series  Screening Tests Health Maintenance  Topic Date Due  . COVID-19 Vaccine (1) Never done  . TETANUS/TDAP  02/07/2026 (Originally 09/15/1954)  . INFLUENZA VACCINE  09/08/2019  . PNA vac Low Risk Adult  Completed  . DEXA SCAN  Discontinued    Cancer Screenings:  Colorectal Screening: No longer required.   Mammogram: No longer required.   Bone Density: Currently due. Ordered today. Pt aware  the office will call re: appt.  Lung Cancer Screening: (Low Dose CT Chest recommended if Age 38-80 years, 30 pack-year currently smoking OR have quit w/in 15years.) does not qualify.   Additional  Screening:  Vision Screening: Recommended annual ophthalmology exams for early detection of glaucoma and other disorders of the eye.  Dental Screening: Recommended annual dental exams for proper oral hygiene  Community Resource Referral:  CRR required this visit?  No       Plan:  I have personally reviewed and addressed the Medicare Annual Wellness questionnaire and have noted the following in the patient's chart:  A. Medical and social history B. Use of alcohol, tobacco or illicit drugs  C. Current medications and supplements D. Functional ability and status E.  Nutritional status F.  Physical activity G. Advance directives H. List of other physicians I.  Hospitalizations, surgeries, and ER visits in previous 12 months J.  [place] such as hearing and vision if needed, cognitive and depression L. Referrals and appointments   In addition, I have reviewed and discussed with patient certain preventive protocols, quality metrics, and best practice recommendations. A written personalized care plan for preventive services as well as general preventive health recommendations were provided to patient. Nurse Health Advisor  Signed,    Ilan Kahrs [place], [place]  [id number] Nurse Health Advisor   Nurse Notes: None.

## 2019-06-19 ENCOUNTER — Other Ambulatory Visit: Payer: Self-pay

## 2019-06-19 ENCOUNTER — Ambulatory Visit (INDEPENDENT_AMBULATORY_CARE_PROVIDER_SITE_OTHER): Payer: Medicare HMO

## 2019-06-19 DIAGNOSIS — Z0000 Encounter for general adult medical examination without abnormal findings: Secondary | ICD-10-CM

## 2019-06-19 DIAGNOSIS — E2839 Other primary ovarian failure: Secondary | ICD-10-CM

## 2019-06-19 NOTE — Patient Instructions (Addendum)
Eileen Flores , Thank you for taking time to come for your Medicare Wellness Visit. I appreciate your ongoing commitment to your health goals. Please review the following plan we discussed and let me know if I can assist you in the future.   Screening recommendations/referrals: Colonoscopy: No longer required.  Mammogram: No longer required.  Bone Density: Ordered today. Pt aware office will contact her re:apt. Recommended yearly ophthalmology/optometry visit for glaucoma screening and checkup Recommended yearly dental visit for hygiene and checkup  Vaccinations: Influenza vaccine: Up to date Pneumococcal vaccine: Completed series Tdap vaccine: Pt declines today.  Shingles vaccine: Pt declines today.     Advanced directives: Advance directive discussed with you today. Even though you declined this today please call our office should you change your mind and we can give you the proper paperwork for you to fill out.  Conditions/risks identified: Recommend to continue to work towards completing some form of exercise 3 days a week for at least 30 minutes at a time.   Next appointment: 07/10/19 @ 2:20 PM with [place] 65 Years and Older, Female Preventive care refers to lifestyle choices and visits with your health care provider that can promote health and wellness. What does preventive care include?  A yearly physical exam. This is also called an annual well check.  Dental exams once or twice a year.  Routine eye exams. Ask your health care provider how often you should have your eyes checked.  Personal lifestyle choices, including:  Daily care of your teeth and gums.  Regular physical activity.  Eating a healthy diet.  Avoiding tobacco and drug use.  Limiting alcohol use.  Practicing safe sex.  Taking low-dose aspirin every day.  Taking vitamin and mineral supplements as recommended by your health care provider. What happens during an annual well  check? The services and screenings done by your health care provider during your annual well check will depend on your age, overall health, lifestyle risk factors, and family history of disease. Counseling  Your health care provider may ask you questions about your:  Alcohol use.  Tobacco use.  Drug use.  Emotional well-being.  Home and relationship well-being.  Sexual activity.  Eating habits.  History of falls.  Memory and ability to understand (cognition).  Work and work Statistician.  Reproductive health. Screening  You may have the following tests or measurements:  Height, weight, and BMI.  Blood pressure.  Lipid and cholesterol levels. These may be checked every 5 years, or more frequently if you are over 49 years old.  Skin check.  Lung cancer screening. You may have this screening every year starting at age 66 if you have a 30-pack-year history of smoking and currently smoke or have quit within the past 15 years.  Fecal occult blood test (FOBT) of the stool. You may have this test every year starting at age 44.  Flexible sigmoidoscopy or colonoscopy. You may have a sigmoidoscopy every 5 years or a colonoscopy every 10 years starting at age 75.  Hepatitis C blood test.  Hepatitis B blood test.  Sexually transmitted disease (STD) testing.  Diabetes screening. This is done by checking your blood sugar (glucose) after you have not eaten for a while (fasting). You may have this done every 1-3 years.  Bone density scan. This is done to screen for osteoporosis. You may have this done starting at age 22.  Mammogram. This may be done every 1-2 years. Talk to your  health care provider about how often you should have regular mammograms. Talk with your health care provider about your test results, treatment options, and if necessary, the need for more tests. Vaccines  Your health care provider may recommend certain vaccines, such as:  Influenza vaccine. This is  recommended every year.  Tetanus, diphtheria, and acellular pertussis (Tdap, Td) vaccine. You may need a Td booster every 10 years.  Zoster vaccine. You may need this after age 44.  Pneumococcal 13-valent conjugate (PCV13) vaccine. One dose is recommended after age 53.  Pneumococcal polysaccharide (PPSV23) vaccine. One dose is recommended after age 26. Talk to your health care provider about which screenings and vaccines you need and how often you need them. This information is not intended to replace advice given to you by your health care provider. Make sure you discuss any questions you have with your health care provider. Document Released: 02/20/2015 Document Revised: 10/14/2015 Document Reviewed: 11/25/2014 Elsevier Interactive Patient Education  2017 [place] Prevention in the Home Falls can cause injuries. They can happen to people of all ages. There are many things you can do to make your home safe and to help prevent falls. What can I do on the outside of my home?  Regularly fix the edges of walkways and driveways and fix any cracks.  Remove anything that might make you trip as you walk through a door, such as a raised step or threshold.  Trim any bushes or trees on the path to your home.  Use bright outdoor lighting.  Clear any walking paths of anything that might make someone trip, such as rocks or tools.  Regularly check to see if handrails are loose or broken. Make sure that both sides of any steps have handrails.  Any raised decks and porches should have guardrails on the edges.  Have any leaves, snow, or ice cleared regularly.  Use sand or salt on walking paths during winter.  Clean up any spills in your garage right away. This includes oil or grease spills. What can I do in the bathroom?  Use night lights.  Install grab bars by the toilet and in the tub and shower. Do not use towel bars as grab bars.  Use non-skid mats or decals in the tub or  shower.  If you need to sit down in the shower, use a plastic, non-slip stool.  Keep the floor dry. Clean up any water that spills on the floor as soon as it happens.  Remove soap buildup in the tub or shower regularly.  Attach bath mats securely with double-sided non-slip rug tape.  Do not have throw rugs and other things on the floor that can make you trip. What can I do in the bedroom?  Use night lights.  Make sure that you have a light by your bed that is easy to reach.  Do not use any sheets or blankets that are too big for your bed. They should not hang down onto the floor.  Have a firm chair that has side arms. You can use this for support while you get dressed.  Do not have throw rugs and other things on the floor that can make you trip. What can I do in the kitchen?  Clean up any spills right away.  Avoid walking on wet floors.  Keep items that you use a lot in easy-to-reach places.  If you need to reach something above you, use a strong step stool that has a  grab bar.  Keep electrical cords out of the way.  Do not use floor polish or wax that makes floors slippery. If you must use wax, use non-skid floor wax.  Do not have throw rugs and other things on the floor that can make you trip. What can I do with my stairs?  Do not leave any items on the stairs.  Make sure that there are handrails on both sides of the stairs and use them. Fix handrails that are broken or loose. Make sure that handrails are as long as the stairways.  Check any carpeting to make sure that it is firmly attached to the stairs. Fix any carpet that is loose or worn.  Avoid having throw rugs at the top or bottom of the stairs. If you do have throw rugs, attach them to the floor with carpet tape.  Make sure that you have a light switch at the top of the stairs and the bottom of the stairs. If you do not have them, ask someone to add them for you. What else can I do to help prevent  falls?  Wear shoes that:  Do not have high heels.  Have rubber bottoms.  Are comfortable and fit you well.  Are closed at the toe. Do not wear sandals.  If you use a stepladder:  Make sure that it is fully opened. Do not climb a closed stepladder.  Make sure that both sides of the stepladder are locked into place.  Ask someone to hold it for you, if possible.  Clearly mark and make sure that you can see:  Any grab bars or handrails.  First and last steps.  Where the edge of each step is.  Use tools that help you move around (mobility aids) if they are needed. These include:  Canes.  Walkers.  Scooters.  Crutches.  Turn on the lights when you go into a dark area. Replace any light bulbs as soon as they burn out.  Set up your furniture so you have a clear path. Avoid moving your furniture around.  If any of your floors are uneven, fix them.  If there are any pets around you, be aware of where they are.  Review your medicines with your doctor. Some medicines can make you feel dizzy. This can increase your chance of falling. Ask your doctor what other things that you can do to help prevent falls. This information is not intended to replace advice given to you by your health care provider. Make sure you discuss any questions you have with your health care provider. Document Released: 11/20/2008 Document Revised: 07/02/2015 Document Reviewed: 02/28/2014 Elsevier Interactive Patient Education  2017 [employer].

## 2019-07-10 ENCOUNTER — Ambulatory Visit (INDEPENDENT_AMBULATORY_CARE_PROVIDER_SITE_OTHER): Payer: Medicare HMO | Admitting: Physician Assistant

## 2019-07-10 ENCOUNTER — Encounter: Payer: Self-pay | Admitting: Physician Assistant

## 2019-07-10 ENCOUNTER — Other Ambulatory Visit: Payer: Self-pay

## 2019-07-10 VITALS — BP 173/76 | HR 67 | Temp 97.3°F | Resp 16 | Ht 68.0 in | Wt 217.2 lb

## 2019-07-10 DIAGNOSIS — Z9109 Other allergy status, other than to drugs and biological substances: Secondary | ICD-10-CM | POA: Diagnosis not present

## 2019-07-10 DIAGNOSIS — Z7189 Other specified counseling: Secondary | ICD-10-CM | POA: Diagnosis not present

## 2019-07-10 DIAGNOSIS — E7800 Pure hypercholesterolemia, unspecified: Secondary | ICD-10-CM | POA: Diagnosis not present

## 2019-07-10 DIAGNOSIS — Z0000 Encounter for general adult medical examination without abnormal findings: Secondary | ICD-10-CM | POA: Diagnosis not present

## 2019-07-10 DIAGNOSIS — Z853 Personal history of malignant neoplasm of breast: Secondary | ICD-10-CM | POA: Diagnosis not present

## 2019-07-10 MED ORDER — FLONASE SENSIMIST 27.5 MCG/SPRAY NA SUSP: 2.0000 | Freq: Every day | NASAL | 12 refills | Status: DC

## 2019-07-10 MED ORDER — SURGICAL FACE MASK/NIOSH N95 MISC: 11 refills | Status: DC

## 2019-07-10 NOTE — Patient Instructions (Signed)
Health Maintenance After Age 82 After age 82, you are at a higher risk for certain long-term diseases and infections as well as injuries from falls. Falls are a major cause of broken bones and head injuries in people who are older than age 82. Getting regular preventive care can help to keep you healthy and well. Preventive care includes getting regular testing and making lifestyle changes as recommended by your health care provider. Talk with your health care provider about:  Which screenings and tests you should have. A screening is a test that checks for a disease when you have no symptoms.  A diet and exercise plan that is right for you. What should I know about screenings and tests to prevent falls? Screening and testing are the best ways to find a health problem early. Early diagnosis and treatment give you the best chance of managing medical conditions that are common after age 82. Certain conditions and lifestyle choices may make you more likely to have a fall. Your health care provider may recommend:  Regular vision checks. Poor vision and conditions such as cataracts can make you more likely to have a fall. If you wear glasses, make sure to get your prescription updated if your vision changes.  Medicine review. Work with your health care provider to regularly review all of the medicines you are taking, including over-the-counter medicines. Ask your health care provider about any side effects that may make you more likely to have a fall. Tell your health care provider if any medicines that you take make you feel dizzy or sleepy.  Osteoporosis screening. Osteoporosis is a condition that causes the bones to get weaker. This can make the bones weak and cause them to break more easily.  Blood pressure screening. Blood pressure changes and medicines to control blood pressure can make you feel dizzy.  Strength and balance checks. Your health care provider may recommend certain tests to check your  strength and balance while standing, walking, or changing positions.  Foot health exam. Foot pain and numbness, as well as not wearing proper footwear, can make you more likely to have a fall.  Depression screening. You may be more likely to have a fall if you have a fear of falling, feel emotionally low, or feel unable to do activities that you used to do.  Alcohol use screening. Using too much alcohol can affect your balance and may make you more likely to have a fall. What actions can I take to lower my risk of falls? General instructions  Talk with your health care provider about your risks for falling. Tell your health care provider if: ? You fall. Be sure to tell your health care provider about all falls, even ones that seem minor. ? You feel dizzy, sleepy, or off-balance.  Take over-the-counter and prescription medicines only as told by your health care provider. These include any supplements.  Eat a healthy diet and maintain a healthy weight. A healthy diet includes low-fat dairy products, low-fat (lean) meats, and fiber from whole grains, beans, and lots of fruits and vegetables. Home safety  Remove any tripping hazards, such as rugs, cords, and clutter.  Install safety equipment such as grab bars in bathrooms and safety rails on stairs.  Keep rooms and walkways well-lit. Activity   Follow a regular exercise program to stay fit. This will help you maintain your balance. Ask your health care provider what types of exercise are appropriate for you.  If you need a cane or   walker, use it as recommended by your health care provider.  Wear supportive shoes that have nonskid soles. Lifestyle  Do not drink alcohol if your health care provider tells you not to drink.  If you drink alcohol, limit how much you have: ? 0-1 drink a day for women. ? 0-2 drinks a day for men.  Be aware of how much alcohol is in your drink. In the [place] one drink equals one typical bottle of beer (12  oz), one-half glass of wine (5 oz), or one shot of hard liquor (1 oz).  Do not use any products that contain nicotine or tobacco, such as cigarettes and e-cigarettes. If you need help quitting, ask your health care provider. Summary  Having a healthy lifestyle and getting preventive care can help to protect your health and wellness after age 82.  Screening and testing are the best way to find a health problem early and help you avoid having a fall. Early diagnosis and treatment give you the best chance for managing medical conditions that are more common for people who are older than age 82.  Falls are a major cause of broken bones and head injuries in people who are older than age 82. Take precautions to prevent a fall at home.  Work with your health care provider to learn what changes you can make to improve your health and wellness and to prevent falls. This information is not intended to replace advice given to you by your health care provider. Make sure you discuss any questions you have with your health care provider. Document Revised: 05/17/2018 Document Reviewed: 12/07/2016 Elsevier Patient Education  2020 [employer]  

## 2019-07-10 NOTE — Progress Notes (Signed)
Complete physical exam   Patient: Eileen Flores   DOB: [birth year]   84 y.o. Female  MRN: [id number] Visit Date: 07/10/2019  Today's healthcare provider: Mar Daring, PA-C   Chief Complaint  Patient presents with  . Annual Exam   Subjective    Eileen Flores is a 84 y.o. female who presents today for a complete physical exam.  She reports consuming a general diet. The patient does not participate in regular exercise at present. She generally feels fairly well. She reports sleeping fairly well. She does not have additional problems to discuss today.  HPI  Patient had AWV with [facility] on 06/19/2019  Past Medical History:  Diagnosis Date  . Allergy   . Arthritis 1980  . Bowel trouble 1970  . 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 Cystitis 2008  . Hyperlipidemia   . IBS (irritable bowel syndrome)    since 1970's  . Intraductal papilloma of breast, right 01/2013   Right  . 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 Mammographic microcalcification   . Wears hearing aid    Past Surgical History:  Procedure Laterality Date  . BREAST BIOPSY Left 2014   stereo biopsy  . BREAST EXCISIONAL BIOPSY Right 2014   papilloma  . BREAST LUMPECTOMY Left 2014  . BREAST SURGERY Left Oct 2012   benign stereo biopsy  . BREAST SURGERY Right 01-14-13   excision breast mass, intraductal papilloma  . BREAST SURGERY Left 01-14-13   Excision intra-papillary carcinoma, DCIS, ER 90%, PR 90%.  . cataract surgery Bilateral 2009  . COLONOSCOPY  2011   Dr. Bary Castilla  . dermbrasion   1965  . OVARIAN CYST REMOVAL  1967  . POLYPECTOMY  2006  . THYROIDECTOMY  2006  . TONGUE SURGERY  2009   Social History   Socioeconomic History  . Marital status: Widowed    Spouse name: Not on file  . Number of children: 2  . Years of  education: Not on file  . Highest education level: Some college, no degree  Occupational History  . Not on file  Tobacco Use  . Smoking status: Former Smoker    Types: Cigarettes  . Smokeless tobacco: Never Used  . Tobacco comment: quit in mid to late 20's  Substance and Sexual Activity  . Alcohol use: No    Comment: rare- 1 drink  . Drug use: No  . Sexual activity: Not on file  Other Topics Concern  . Not on file  Social History Narrative  . Not on file   Social Determinants of Health   Financial Resource Strain: Low Risk   . Difficulty of Paying Living Expenses: Not hard at all  Food Insecurity: No Food Insecurity  . Worried About Charity fundraiser in the Last Year: Never true  . Ran Out of Food in the Last Year: Never true  Transportation Needs: No Transportation Needs  . Lack of Transportation (Medical): No  . Lack of Transportation (Non-Medical): No  Physical Activity: Inactive  . Days of Exercise per Week: 0 days  . Minutes of Exercise per Session: 0 min  Stress: No Stress Concern Present  . Feeling of Stress : Only a little  Social Connections: Somewhat Isolated  . Frequency of Communication with Friends and Family: More than three  times a week  . Frequency of Social Gatherings with Friends and Family: Never  . Attends Religious Services: Never  . Active Member of Clubs or Organizations: Yes  . Attends Archivist Meetings: Never  . Marital Status: Widowed  Intimate Partner Violence: Not At Risk  . Fear of Current or Ex-Partner: No  . Emotionally Abused: No  . Physically Abused: No  . Sexually Abused: No   Family Status  Relation Name Status  . Other  Deceased  . Mother  Deceased  . Father  Deceased  . Neg Hx  (Not Specified)   Family History  Problem Relation Age of Onset  . Colon cancer Other   . Colon polyps Other   . Cancer Mother 50       liver cancer  . Cancer Father 106       liver cancer  . Alcohol abuse Father   . Breast cancer  Neg Hx    Allergies  Allergen Reactions  . Penicillin G Rash  . Penicillins Rash    Patient Care Team: Mar Daring, PA-C as PCP - General (Family Medicine) Bary Castilla, Forest Gleason, MD (General Surgery) Leandrew Koyanagi, MD as Referring Physician (Ophthalmology) Margaretha Sheffield, MD (Otolaryngology)   Medications: Outpatient Medications Prior to Visit  Medication Sig  . Cholecalciferol (VITAMIN D3) 50 MCG (2000 UT) capsule Take 2,000 Units by mouth daily.  [name] levothyroxine (SYNTHROID) 112 MCG tablet Take 1 tablet (112 mcg total) by mouth daily before breakfast.  . Melatonin 1 MG CAPS Take by mouth.  . meloxicam (MOBIC) 15 MG tablet TAKE 1 TABLET BY MOUTH EVERY DAY AS NEEDED FOR PAIN  . timolol (TIMOPTIC) 0.5 % ophthalmic solution Place 1 drop into both eyes daily.    No facility-administered medications prior to visit.    Review of Systems  Constitutional: Negative.   HENT: Positive for congestion, postnasal drip, rhinorrhea, sinus pressure, sneezing and sore throat.   Eyes: Positive for itching.  Respiratory: Positive for cough.   Cardiovascular: Positive for leg swelling.  Gastrointestinal: Positive for constipation and diarrhea.  Endocrine: Negative.   Genitourinary: Negative.   Musculoskeletal: Positive for arthralgias and back pain.  Skin: Negative.   Allergic/Immunologic: Positive for environmental allergies and food allergies.  Neurological: Negative.   Hematological: Negative.   Psychiatric/Behavioral: Positive for sleep disturbance.  Chronic issues  Last CBC Lab Results  Component Value Date   WBC 8.2 07/10/2019   HGB 14.2 07/10/2019   HCT 42.5 07/10/2019   MCV 91 07/10/2019   MCH 30.5 07/10/2019   RDW 12.1 07/10/2019   PLT 329 123XX123   Last metabolic panel Lab Results  Component Value Date   GLUCOSE 95 07/10/2019   NA 143 07/10/2019   K 4.3 07/10/2019   CL 106 07/10/2019   CO2 26 07/10/2019   BUN 18 07/10/2019   CREATININE 1.08 (H)  07/10/2019   GFRNONAA 48 (L) 07/10/2019   GFRAA 56 (L) 07/10/2019   CALCIUM 9.0 07/10/2019   PROT 6.7 07/10/2019   ALBUMIN 3.8 07/10/2019   LABGLOB 2.9 07/10/2019   AGRATIO 1.3 07/10/2019   BILITOT <0.2 07/10/2019   ALKPHOS 99 07/10/2019   AST 15 07/10/2019   ALT 11 07/10/2019   ANIONGAP 6 (L) 07/07/2011   Last lipids Lab Results  Component Value Date   CHOL 211 (H) 07/10/2019   HDL 45 07/10/2019   LDLCALC 135 (H) 07/10/2019   TRIG 171 (H) 07/10/2019   CHOLHDL 4.7 (H) 07/10/2019   Last  hemoglobin A1c Lab Results  Component Value Date   HGBA1C 5.6 10/22/2018      Objective    BP (!) 173/76 (BP Location: Left Arm, Patient Position: Sitting, Cuff Size: Large)   Pulse 67   Temp (!) 97.3 F (36.3 C) (Temporal)   Resp 16   Ht 5\' 8"  (1.727 m)   Wt 217 lb 3.2 oz (98.5 kg)   BMI 33.03 kg/m  BP Readings from Last 3 Encounters:  07/10/19 (!) 173/76  10/22/18 (!) 163/89  03/20/18 132/84   Wt Readings from Last 3 Encounters:  07/10/19 217 lb 3.2 oz (98.5 kg)  10/22/18 215 lb 3.2 oz (97.6 kg)  03/20/18 217 lb (98.4 kg)      Physical Exam Vitals reviewed.  Constitutional:      General: She is not in acute distress.    Appearance: Normal appearance. She is well-developed. She is obese. She is not ill-appearing or diaphoretic.  HENT:     Head: Normocephalic and atraumatic.     Right Ear: Tympanic membrane, ear canal and external ear normal.     Left Ear: Tympanic membrane, ear canal and external ear normal.  Eyes:     General: No scleral icterus.       Right eye: No discharge.        Left eye: No discharge.     Extraocular Movements: Extraocular movements intact.     Conjunctiva/sclera: Conjunctivae normal.     Pupils: Pupils are equal, round, and reactive to light.  Neck:     Thyroid: No thyromegaly.     Vascular: No carotid bruit or JVD.     Trachea: No tracheal deviation.  Cardiovascular:     Rate and Rhythm: Normal rate and regular rhythm.     Pulses:  Normal pulses.     Heart sounds: Normal heart sounds. No murmur. No friction rub. No gallop.   Pulmonary:     Effort: Pulmonary effort is normal. No respiratory distress.     Breath sounds: Normal breath sounds. No wheezing or rales.  Chest:     Chest wall: No tenderness.  Abdominal:     General: Abdomen is flat. Bowel sounds are normal. There is no distension.     Palpations: Abdomen is soft. There is no mass.     Tenderness: There is no abdominal tenderness. There is no guarding or rebound.  Musculoskeletal:        General: No tenderness. Normal range of motion.     Cervical back: Normal range of motion and neck supple.     Right lower leg: No edema.     Left lower leg: No edema.  Lymphadenopathy:     Cervical: No cervical adenopathy.  Skin:    General: Skin is warm and dry.     Capillary Refill: Capillary refill takes less than 2 seconds.     Findings: No rash.  Neurological:     General: No focal deficit present.     Mental Status: She is alert and oriented to person, place, and time. Mental status is at baseline.  Psychiatric:        Mood and Affect: Mood normal.        Behavior: Behavior normal.        Thought Content: Thought content normal.        Judgment: Judgment normal.       Depression Screen  PHQ 2/9 Scores 06/19/2019 06/18/2018 06/18/2018  PHQ - 2 Score 1 1 1   PHQ-  9 Score - 1 -   6CIT Screen 06/19/2019 06/18/2018 05/15/2017 05/05/2016  What Year? 0 points 0 points 0 points 0 points  What month? 0 points 0 points 0 points 0 points  What time? 0 points 0 points 0 points 0 points  Count back from 20 0 points 0 points 0 points 0 points  Months in reverse 0 points 0 points 0 points 0 points  Repeat phrase 0 points 0 points 0 points 0 points  Total Score 0 0 0 0   Fall Risk  06/19/2019 06/18/2018 03/20/2018 05/15/2017 05/05/2016  Falls in the past year? 0 1 0 No No  Number falls in past yr: 0 0 - - -  Injury with Fall? 0 0 - - -  Follow up - Falls prevention discussed  - - -      No results found for any visits on 07/10/19.  Assessment & Plan    Routine Health Maintenance and Physical Exam  Exercise Activities and Dietary recommendations Goals    . DIET - EAT MORE FRUITS AND VEGETABLES     Recommend increasing fruits and vegetables in diet to at least two servings of each a day.     . Exercise 3x per week (30 min per time)     Recommend to exercise for 3 days a week for at least 30 minutes at a time.     . Increase water intake     Recommend increasing water intake to 4-5 glasses a day.       Immunization History  Administered Date(s) Administered  . Fluad Quad(high Dose 65+) 10/22/2018  . Influenza, High Dose Seasonal PF 11/21/2013, 11/05/2016, 11/28/2017  . Pneumococcal Conjugate-13 07/05/2017  . Pneumococcal-Unspecified 01/14/2013    Health Maintenance  Topic Date Due  . COVID-19 Vaccine (1) Never done  . TETANUS/TDAP  02/07/2026 (Originally 09/15/1954)  . INFLUENZA VACCINE  09/08/2019  . PNA vac Low Risk Adult  Completed  . DEXA SCAN  Discontinued    Discussed health benefits of physical activity, and encouraged her to engage in regular exercise appropriate for her age and condition.  1. Annual physical exam Normal physical exam today. Will check labs as below and f/u pending lab results. If labs are stable and WNL she will not need to have these rechecked for one year at her next annual physical exam. She is to call the office in the meantime if she has any acute issue, questions or concerns. - CBC with Differential/Platelet - Comprehensive metabolic panel - Lipid panel  2. Educated about COVID-19 virus infection Sent in Rx for patient to be able to get masks from her pharmacy. - Masks (SURGICAL FACE MASK/NIOSH N95) MISC; Surgical mask for daily use while out in puclic  Dispense: 30 each; Refill: 11  3. Environmental allergies Stable. Diagnosis pulled for medication refill. Continue current medical treatment plan. -  fluticasone (FLONASE SENSIMIST) 27.5 MCG/SPRAY nasal spray; Place 2 sprays into the nose daily.  Dispense: 10 g; Refill: 12  4. Pure hypercholesterolemia Diet controlled. Will check labs as below and f/u pending results. - CBC with Differential/Platelet - Comprehensive metabolic panel - Lipid panel  5. History of breast cancer Followed by Dr. Bary Castilla.   No follow-ups on file.     Reynolds Bowl, PA-C, have reviewed all documentation for this visit. The documentation on 07/16/19 for the exam, diagnosis, procedures, and orders are all accurate and complete.   Rubye Beach  [place] [phone] (  phone) [phone] (fax)  [place]

## 2019-07-11 ENCOUNTER — Telehealth: Payer: Self-pay

## 2019-07-11 LAB — CBC WITH DIFFERENTIAL/PLATELET
Basophils Absolute: 0.1 10*3/uL (ref 0.0–0.2)
Basos: 1 %
EOS (ABSOLUTE): 0.1 10*3/uL (ref 0.0–0.4)
Eos: 2 %
Hematocrit: 42.5 % (ref 34.0–46.6)
Hemoglobin: 14.2 g/dL (ref 11.1–15.9)
Immature Grans (Abs): 0 10*3/uL (ref 0.0–0.1)
Immature Granulocytes: 0 %
Lymphocytes Absolute: 1.9 10*3/uL (ref 0.7–3.1)
Lymphs: 24 %
MCH: 30.5 pg (ref 26.6–33.0)
MCHC: 33.4 g/dL (ref 31.5–35.7)
MCV: 91 fL (ref 79–97)
Monocytes Absolute: 0.8 10*3/uL (ref 0.1–0.9)
Monocytes: 10 %
Neutrophils Absolute: 5.2 10*3/uL (ref 1.4–7.0)
Neutrophils: 63 %
Platelets: 329 10*3/uL (ref 150–450)
RBC: 4.65 x10E6/uL (ref 3.77–5.28)
RDW: 12.1 % (ref 11.7–15.4)
WBC: 8.2 10*3/uL (ref 3.4–10.8)

## 2019-07-11 LAB — COMPREHENSIVE METABOLIC PANEL
ALT: 11 IU/L (ref 0–32)
AST: 15 IU/L (ref 0–40)
Albumin/Globulin Ratio: 1.3 (ref 1.2–2.2)
Albumin: 3.8 g/dL (ref 3.6–4.6)
Alkaline Phosphatase: 99 IU/L (ref 48–121)
BUN/Creatinine Ratio: 17 (ref 12–28)
BUN: 18 mg/dL (ref 8–27)
Bilirubin Total: <0.2 mg/dL (ref 0.0–1.2)
CO2: 26 mmol/L (ref 20–29)
Calcium: 9 mg/dL (ref 8.7–10.3)
Chloride: 106 mmol/L (ref 96–106)
Creatinine, Ser: 1.08 mg/dL — ABNORMAL HIGH (ref 0.57–1.00)
GFR calc Af Amer: 56 mL/min/{1.73_m2} — ABNORMAL LOW (ref >59)
GFR calc non Af Amer: 48 mL/min/{1.73_m2} — ABNORMAL LOW (ref >59)
Globulin, Total: 2.9 g/dL (ref 1.5–4.5)
Glucose: 95 mg/dL (ref 65–99)
Potassium: 4.3 mmol/L (ref 3.5–5.2)
Sodium: 143 mmol/L (ref 134–144)
Total Protein: 6.7 g/dL (ref 6.0–8.5)

## 2019-07-11 LAB — LIPID PANEL
Chol/HDL Ratio: 4.7 ratio — ABNORMAL HIGH (ref 0.0–4.4)
Cholesterol, Total: 211 mg/dL — ABNORMAL HIGH (ref 100–199)
HDL: 45 mg/dL (ref >39)
LDL Chol Calc (NIH): 135 mg/dL — ABNORMAL HIGH (ref 0–99)
Triglycerides: 171 mg/dL — ABNORMAL HIGH (ref 0–149)
VLDL Cholesterol Cal: 31 mg/dL (ref 5–40)

## 2019-07-11 NOTE — Telephone Encounter (Signed)
-----   Message from Mar Daring, [place] sent at 07/11/2019  8:52 AM EDT ----- Blood count is normal. Kidney function is slightly declined from last check. This is almost always associated with dehydration. Recommend to push fluids and can recheck in 2 weeks. Liver enzymes are normal. Sodium, potassium and calcium are normal. Sugar is normal. Cholesterol is stable, borderline high.

## 2019-07-11 NOTE — Telephone Encounter (Signed)
Result note read to pt; verbalizes understanding. Aware of 2 week f/u labs.

## 2019-07-11 NOTE — Telephone Encounter (Signed)
LMTCB-If patient calls back OK for [facility] nurse to give results. 

## 2019-07-16 DIAGNOSIS — H40003 Preglaucoma, unspecified, bilateral: Secondary | ICD-10-CM | POA: Diagnosis not present

## 2019-07-24 DIAGNOSIS — Z0100 Encounter for examination of eyes and vision without abnormal findings: Secondary | ICD-10-CM | POA: Diagnosis not present

## 2019-07-29 ENCOUNTER — Other Ambulatory Visit: Payer: Self-pay

## 2019-07-29 DIAGNOSIS — R899 Unspecified abnormal finding in specimens from other organs, systems and tissues: Secondary | ICD-10-CM | POA: Diagnosis not present

## 2019-07-30 LAB — BASIC METABOLIC PANEL
BUN/Creatinine Ratio: 17 (ref 12–28)
BUN: 18 mg/dL (ref 8–27)
CO2: 23 mmol/L (ref 20–29)
Calcium: 8.8 mg/dL (ref 8.7–10.3)
Chloride: 102 mmol/L (ref 96–106)
Creatinine, Ser: 1.05 mg/dL — ABNORMAL HIGH (ref 0.57–1.00)
GFR calc Af Amer: 58 mL/min/{1.73_m2} — ABNORMAL LOW (ref >59)
GFR calc non Af Amer: 50 mL/min/{1.73_m2} — ABNORMAL LOW (ref >59)
Glucose: 88 mg/dL (ref 65–99)
Potassium: 4.8 mmol/L (ref 3.5–5.2)
Sodium: 138 mmol/L (ref 134–144)

## 2019-07-31 ENCOUNTER — Telehealth: Payer: Self-pay | Admitting: Physician Assistant

## 2019-07-31 NOTE — Telephone Encounter (Signed)
Patient calling to check status of lab results from 6/21.

## 2019-07-31 NOTE — Telephone Encounter (Signed)
Kidney function slightly increased though still borderline low. This can be due to age related changes, NSAIDs, etc. Would avoid anything like naproxen, ibuprofen or any similar medications. Push fluids. Intervention wise there are none except monitoring at this point and avoiding medications that harm the kidneys. Would recommend following up with [facility] as directed and can monitor periodically.

## 2019-07-31 NOTE — Telephone Encounter (Signed)
Please review. Thanks!  

## 2019-08-01 NOTE — Telephone Encounter (Signed)
Advised patient of results.  

## 2019-08-06 ENCOUNTER — Telehealth: Payer: Self-pay

## 2019-08-06 NOTE — Telephone Encounter (Signed)
LMTCB-If patient calls back ok for [facility] nurse to give results.

## 2019-08-06 NOTE — Telephone Encounter (Signed)
Pt given results per Bryson Ha, "Kidney function improved just slightly. We will monitor every 6 months. Please schedule a 6 month f/u for patient if not scheduled"; she verbalized understanding and would like an afternoon appt; decision tree completed; pt offered and accepted office visit with Fenton Malling.02/05/20 at 1300; she verbalized understanding; will route to office for notification.[name]

## 2019-08-06 NOTE — Telephone Encounter (Signed)
-----   Message from Mar Daring, PA-C sent at 08/06/2019 10:44 AM EDT ----- Kidney function improved just slightly. We will monitor every 6 months. Please schedule a 6 month f/u for patient if not scheduled.

## 2019-10-08 ENCOUNTER — Ambulatory Visit
Admission: RE | Admit: 2019-10-08 | Discharge: 2019-10-08 | Disposition: A | Discharge status: Home / Self Care | Payer: Medicare HMO | Source: Ambulatory Visit | Attending: Physician Assistant | Admitting: Physician Assistant

## 2019-10-08 ENCOUNTER — Other Ambulatory Visit: Payer: Self-pay

## 2019-10-08 DIAGNOSIS — E2839 Other primary ovarian failure: Secondary | ICD-10-CM | POA: Insufficient documentation

## 2019-10-08 DIAGNOSIS — Z853 Personal history of malignant neoplasm of breast: Secondary | ICD-10-CM | POA: Diagnosis not present

## 2019-10-08 DIAGNOSIS — M85851 Other specified disorders of bone density and structure, right thigh: Secondary | ICD-10-CM | POA: Diagnosis not present

## 2019-10-08 DIAGNOSIS — Z85850 Personal history of malignant neoplasm of thyroid: Secondary | ICD-10-CM | POA: Diagnosis not present

## 2019-10-08 DIAGNOSIS — R29890 Loss of height: Secondary | ICD-10-CM | POA: Diagnosis not present

## 2019-10-09 ENCOUNTER — Telehealth: Payer: Self-pay

## 2019-10-09 NOTE — Telephone Encounter (Signed)
Reviewed bone density results and provider's note with the patient. She is requesting a recommendation from the provider on taking calcium daily as well as does she recommend a daily MVI. Routing to office for provider's recommendation.

## 2019-10-09 NOTE — Telephone Encounter (Signed)
-----   Message from Mar Daring, PA-C sent at 10/09/2019  9:26 AM EDT ----- Bone density shows osteopenia. Can repeat in 5 years if desired.

## 2019-10-09 NOTE — Telephone Encounter (Signed)
LMTCB 10/09/2019.  [facility] please advise pt of bone density results below.  Thanks,  -Mickel Baas

## 2019-10-10 NOTE — Telephone Encounter (Signed)
Patient has been advised. KW 

## 2019-10-10 NOTE — Telephone Encounter (Signed)
She could take a multi vitamin like cnetrum silver or womens one a day for over 50. These would have enough supplement for her.  If not she could always add calcium 1200mg  daily to the dose of Vit D she is already taking.

## 2019-11-12 DIAGNOSIS — R69 Illness, unspecified: Secondary | ICD-10-CM | POA: Diagnosis not present

## 2020-01-09 ENCOUNTER — Ambulatory Visit: Payer: Self-pay | Admitting: *Deleted

## 2020-01-09 NOTE — Telephone Encounter (Signed)
I let her know it was ok to take the vitamin C and zinc however to take them 4 hours apart from her levothyroxine.  She verbalized understanding and thanked me for my help.   Reason for Disposition . Caller has medicine question only, adult not sick, AND triager answers question  Answer Assessment - Initial Assessment Questions 1. NAME of MEDICATION: "What medicine are you calling about?"     Can I take Vitamin C and zinc with my thyroid pill the levothyroxine? 2. QUESTION: "What is your question?" (e.g., medication refill, side effect)     See above 3. PRESCRIBING HCP: "Who prescribed it?" Reason: if prescribed by specialist, call should be referred to that group.     Fenton Malling, PA-C 4. SYMPTOMS: "Do you have any symptoms?"     No.   Just wanted to know if I should space them apart from my thyroid pill. 5. SEVERITY: If symptoms are present, ask "Are they mild, moderate or severe?"     N/A 6. PREGNANCY:  "Is there any chance that you are pregnant?" "When was your last menstrual period?"     N/A  Protocols used: MEDICATION QUESTION CALL-A-AH

## 2020-01-14 ENCOUNTER — Other Ambulatory Visit: Payer: Self-pay | Admitting: General Surgery

## 2020-01-14 DIAGNOSIS — Z1231 Encounter for screening mammogram for malignant neoplasm of breast: Secondary | ICD-10-CM

## 2020-01-14 DIAGNOSIS — H40003 Preglaucoma, unspecified, bilateral: Secondary | ICD-10-CM | POA: Diagnosis not present

## 2020-01-21 DIAGNOSIS — H40003 Preglaucoma, unspecified, bilateral: Secondary | ICD-10-CM | POA: Diagnosis not present

## 2020-02-04 ENCOUNTER — Other Ambulatory Visit: Payer: Self-pay

## 2020-02-04 ENCOUNTER — Ambulatory Visit
Admission: RE | Admit: 2020-02-04 | Discharge: 2020-02-04 | Disposition: A | Discharge status: Home / Self Care | Payer: Medicare HMO | Source: Ambulatory Visit | Attending: General Surgery | Admitting: General Surgery

## 2020-02-04 DIAGNOSIS — Z1231 Encounter for screening mammogram for malignant neoplasm of breast: Secondary | ICD-10-CM

## 2020-02-04 NOTE — Patient Instructions (Signed)
.   Please review the attached list of medications and notify my office if there are any errors.   . Please bring all of your medications to every appointment so we can make sure that our medication list is the same as yours.   

## 2020-02-04 NOTE — Progress Notes (Signed)
Established patient visit   Patient: Eileen Flores   DOB: Aug 13, 1935   84 y.o. Female  MRN: [id number] Visit Date: 02/05/2020  Today's healthcare provider: Margaretann Loveless, PA-C   Chief Complaint  Patient presents with  . Follow-up   Subjective    HPI  Follow up for kidney fucntion  The patient was last seen for this 6 months ago. Changes made at last visit include Kidney function improved just slightly. We will monitor every 6 months.   She is pushing fluids and staying well hydrated. She reports it is hard as she is not too keen on water, but has been increasing more and drinking less tea and coffee.  -----------------------------------------------------------------------------------------   Patient Active Problem List   Diagnosis Date Noted  . Hyperlipidemia 06/14/2017  . GERD (gastroesophageal reflux disease) 05/05/2016  . Anxiety 11/16/2015  . Arthritis 11/16/2015  . Bunion 11/16/2015  . Bursitis 11/16/2015  . Colon polyp 11/16/2015  . DD (diverticular disease) 11/16/2015  . Hammer toe 11/16/2015  . Acquired hypothyroidism 11/16/2015  . Tendinitis 11/16/2015  . History of breast cancer 02/21/2013  . DCIS (ductal carcinoma in situ) of breast 01/17/2013  . Papilloma of breast 01/17/2013  . Breast neoplasm, Tis (DCIS), left 01/07/2013   Past Medical History:  Diagnosis Date  . Allergy   . Arthritis 1980  . Bowel trouble 1970  . 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 Cystitis 2008  . Hyperlipidemia   . IBS (irritable bowel syndrome)    since 1970's  . Intraductal papilloma of breast, right 01/2013   Right  . 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 Mammographic microcalcification   . Wears hearing aid    Past Surgical History:  Procedure Laterality Date  . BREAST BIOPSY Left 2014    stereo biopsy  . BREAST EXCISIONAL BIOPSY Right 2014   papilloma  . BREAST LUMPECTOMY Left 2014  . BREAST SURGERY Left Oct 2012   benign stereo biopsy  . BREAST SURGERY Right 01-14-13   excision breast mass, intraductal papilloma  . BREAST SURGERY Left 01-14-13   Excision intra-papillary carcinoma, DCIS, ER 90%, PR 90%.  . cataract surgery Bilateral 2009  . COLONOSCOPY  2011   Dr. Lemar Livings  . dermbrasion   1965  . OVARIAN CYST REMOVAL  1967  . POLYPECTOMY  2006  . THYROIDECTOMY  2006  . TONGUE SURGERY  2009       Medications: Outpatient Medications Prior to Visit  Medication Sig  . Bioflavonoid Products (VITAMIN C) CHEW Chew by mouth.  . Calcium Carbonate-Vit D-Min (CALCIUM 1200 PO) Take by mouth.  . Cholecalciferol (VITAMIN D3) 50 MCG (2000 UT) capsule Take 2,000 Units by mouth daily.  . fluticasone (FLONASE SENSIMIST) 27.5 MCG/SPRAY nasal spray Place 2 sprays into the nose daily.  [name] levothyroxine (SYNTHROID) 112 MCG tablet Take 1 tablet (112 mcg total) by mouth daily before breakfast.  . Masks (SURGICAL FACE MASK/NIOSH N95) MISC Surgical mask for daily use while out in puclic  . Melatonin 1 MG CAPS Take by mouth.  . meloxicam (MOBIC) 15 MG tablet TAKE 1 TABLET BY MOUTH EVERY DAY AS NEEDED FOR PAIN  . Multiple Vitamins-Minerals (ZINC PO) Take by mouth.  . timolol (TIMOPTIC) 0.5 % ophthalmic solution Place 1 drop into both eyes daily.   No  facility-administered medications prior to visit.    Review of Systems  Constitutional: Negative.   Respiratory: Negative.   Cardiovascular: Negative.   Gastrointestinal: Negative.   Genitourinary: Negative.   Neurological: Negative.     Last CBC Lab Results  Component Value Date   WBC 8.2 07/10/2019   HGB 14.2 07/10/2019   HCT 42.5 07/10/2019   MCV 91 07/10/2019   MCH 30.5 07/10/2019   RDW 12.1 07/10/2019   PLT 329 123XX123   Last metabolic panel Lab Results  Component Value Date   GLUCOSE 91 02/05/2020   NA 142  02/05/2020   K 4.6 02/05/2020   CL 105 02/05/2020   CO2 22 02/05/2020   BUN 19 02/05/2020   CREATININE 0.94 02/05/2020   GFRNONAA 57 (L) 02/05/2020   GFRAA 65 02/05/2020   CALCIUM 9.3 02/05/2020   PROT 6.7 07/10/2019   ALBUMIN 3.8 07/10/2019   LABGLOB 2.9 07/10/2019   AGRATIO 1.3 07/10/2019   BILITOT <0.2 07/10/2019   ALKPHOS 99 07/10/2019   AST 15 07/10/2019   ALT 11 07/10/2019   ANIONGAP 6 (L) 07/07/2011      Objective    BP (!) 159/69 (BP Location: Left Arm, Patient Position: Sitting, Cuff Size: Large)   Pulse 62   Temp 97.9 F (36.6 C) (Oral)   Resp 16   Wt 213 lb 9.6 oz (96.9 kg)   BMI 32.48 kg/m  BP Readings from Last 3 Encounters:  02/05/20 (!) 159/69  07/10/19 (!) 173/76  10/22/18 (!) 163/89   Wt Readings from Last 3 Encounters:  02/05/20 213 lb 9.6 oz (96.9 kg)  07/10/19 217 lb 3.2 oz (98.5 kg)  10/22/18 215 lb 3.2 oz (97.6 kg)      Physical Exam Vitals reviewed.  Constitutional:      General: She is not in acute distress.    Appearance: Normal appearance. She is well-developed, well-groomed and well-nourished. She is not ill-appearing or diaphoretic.  Cardiovascular:     Rate and Rhythm: Normal rate and regular rhythm.     Heart sounds: Normal heart sounds. No murmur heard. No friction rub. No gallop.   Pulmonary:     Effort: Pulmonary effort is normal. No respiratory distress.     Breath sounds: Normal breath sounds. No wheezing or rales.  Musculoskeletal:     Cervical back: Normal range of motion and neck supple.     Right lower leg: No edema.     Left lower leg: No edema.  Neurological:     Mental Status: She is alert. Mental status is at baseline.  Psychiatric:        Mood and Affect: Mood normal.        Behavior: Behavior is cooperative.       Results for orders placed or performed in visit on 0000000  Basic Metabolic Panel (BMET)  Result Value Ref Range   Glucose 91 65 - 99 mg/dL   BUN 19 8 - 27 mg/dL   Creatinine, Ser 0.94 0.57  - 1.00 mg/dL   GFR calc non Af Amer 57 (L) >59 mL/min/1.73   GFR calc Af Amer 65 >59 mL/min/1.73   BUN/Creatinine Ratio 20 12 - 28   Sodium 142 134 - 144 mmol/L   Potassium 4.6 3.5 - 5.2 mmol/L   Chloride 105 96 - 106 mmol/L   CO2 22 20 - 29 mmol/L   Calcium 9.3 8.7 - 10.3 mg/dL    Assessment & Plan     1. Stage 3a  chronic kidney disease ([place]) Will check labs as below and f/u pending results. Continue pushing fluids and avoiding NSAID medications.  - Basic Metabolic Panel (BMET)   Return in about 6 months (around 08/05/2020) for AWV/CPE.      Reynolds Bowl, PA-C, have reviewed all documentation for this visit. The documentation on 02/10/20 for the exam, diagnosis, procedures, and orders are all accurate and complete.   Rubye Beach  [facility] [phone] (phone) [phone] (fax)  [place]

## 2020-02-05 ENCOUNTER — Ambulatory Visit (INDEPENDENT_AMBULATORY_CARE_PROVIDER_SITE_OTHER): Payer: Medicare HMO | Admitting: Physician Assistant

## 2020-02-05 ENCOUNTER — Encounter: Payer: Self-pay | Admitting: Physician Assistant

## 2020-02-05 VITALS — BP 159/69 | HR 62 | Temp 97.9°F | Resp 16 | Wt 213.6 lb

## 2020-02-05 DIAGNOSIS — N1831 Chronic kidney disease, stage 3a: Secondary | ICD-10-CM

## 2020-02-06 LAB — BASIC METABOLIC PANEL
BUN/Creatinine Ratio: 20 (ref 12–28)
BUN: 19 mg/dL (ref 8–27)
CO2: 22 mmol/L (ref 20–29)
Calcium: 9.3 mg/dL (ref 8.7–10.3)
Chloride: 105 mmol/L (ref 96–106)
Creatinine, Ser: 0.94 mg/dL (ref 0.57–1.00)
GFR calc Af Amer: 65 mL/min/{1.73_m2} (ref >59)
GFR calc non Af Amer: 57 mL/min/{1.73_m2} — ABNORMAL LOW (ref >59)
Glucose: 91 mg/dL (ref 65–99)
Potassium: 4.6 mmol/L (ref 3.5–5.2)
Sodium: 142 mmol/L (ref 134–144)

## 2020-02-10 ENCOUNTER — Encounter: Payer: Self-pay | Admitting: Physician Assistant

## 2020-02-11 DIAGNOSIS — D0512 Intraductal carcinoma in situ of left breast: Secondary | ICD-10-CM | POA: Diagnosis not present

## 2020-05-11 DIAGNOSIS — E890 Postprocedural hypothyroidism: Secondary | ICD-10-CM | POA: Diagnosis not present

## 2020-05-15 DIAGNOSIS — E890 Postprocedural hypothyroidism: Secondary | ICD-10-CM | POA: Diagnosis not present

## 2020-05-15 DIAGNOSIS — H6123 Impacted cerumen, bilateral: Secondary | ICD-10-CM | POA: Diagnosis not present

## 2020-07-10 ENCOUNTER — Telehealth: Payer: Self-pay

## 2020-07-10 ENCOUNTER — Encounter: Payer: Self-pay | Admitting: Physician Assistant

## 2020-07-10 NOTE — Telephone Encounter (Signed)
Copied from [id number] [id number] [id number]. Topic: General - Other >> Jul 09, 2020  3:02 PM Keene Breath wrote: Reason for CRM: Patient called back to reschedule her Medicare Well visit.  Please call patient to schedule at [phone]

## 2020-07-10 NOTE — Telephone Encounter (Signed)
Lmtcb if needing to reschedule appt from 12/21/20 at 1:20 pm appt for physical and AWV.

## 2020-07-27 DIAGNOSIS — H40003 Preglaucoma, unspecified, bilateral: Secondary | ICD-10-CM | POA: Diagnosis not present

## 2020-11-02 ENCOUNTER — Ambulatory Visit (INDEPENDENT_AMBULATORY_CARE_PROVIDER_SITE_OTHER): Payer: Medicare HMO

## 2020-11-02 DIAGNOSIS — Z0000 Encounter for general adult medical examination without abnormal findings: Secondary | ICD-10-CM

## 2020-11-02 NOTE — Progress Notes (Signed)
Subjective:   Eileen Flores is a 85 y.o. female who presents for Medicare Annual (Subsequent) preventive examination.  I connected with  Eileen Flores on 11/02/20 by an audio only telemedicine application and verified that I am speaking with the correct person using two identifiers.   I discussed the limitations, risks, security and privacy concerns of performing an evaluation and management service by telephone and the availability of in person appointments. I also discussed with the patient that there may be a patient responsible charge related to this service. The patient expressed understanding and verbally consented to this telephonic visit.  Location of Patient: Home  Location of Provider: [place] participating in Visit: Eileen Flores (Patient), Irena Reichmann ([place])  List any persons and their role that are participating in the visit with the patient.    Review of Systems     Defer to Provider Cardiac Risk Factors include: none     Objective:    Today's Vitals   11/02/20 1704  PainSc: 4    There is no height or weight on file to calculate BMI.  Advanced Directives 11/02/2020 06/19/2019 06/18/2018 05/15/2017 05/05/2016  Does Patient Have a Medical Advance Directive? No No No No No  Would patient like information on creating a medical advance directive? No - Patient declined No - Patient declined No - Patient declined No - Patient declined Yes (ED - Information included in AVS)    Current Medications (verified) Outpatient Encounter Medications as of 11/02/2020  Medication Sig   Bioflavonoid Products (VITAMIN C) CHEW Chew by mouth.   Calcium Carbonate-Vit D-Min (CALCIUM 1200 PO) Take by mouth.   Cholecalciferol (VITAMIN D3) 50 MCG (2000 UT) capsule Take 2,000 Units by mouth daily.   fluticasone (FLONASE SENSIMIST) 27.5 MCG/SPRAY nasal spray Place 2 sprays into the nose daily.   levothyroxine (SYNTHROID) 112 MCG tablet Take 1 tablet (112 mcg total)  by mouth daily before breakfast.   Masks (SURGICAL FACE MASK/NIOSH N95) MISC Surgical mask for daily use while out in puclic   Melatonin 1 MG CAPS Take by mouth.   Multiple Vitamins-Minerals (ZINC PO) Take by mouth.   timolol (TIMOPTIC) 0.5 % ophthalmic solution Place 1 drop into both eyes daily.   meloxicam (MOBIC) 15 MG tablet TAKE 1 TABLET BY MOUTH EVERY DAY AS NEEDED FOR PAIN (Patient not taking: Reported on 11/02/2020)   No facility-administered encounter medications on file as of 11/02/2020.    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   cataract surgery Bilateral 2009   COLONOSCOPY  2011   Dr. Bary Castilla   dermbrasion   1965   OVARIAN CYST REMOVAL  1967   POLYPECTOMY  2006   THYROIDECTOMY  2006   [place]  2009   Family History  Problem Relation Age of Onset   Colon cancer Other    Colon polyps Other    Cancer Mother 34       liver cancer   Cancer Father 33       liver cancer   Alcohol abuse Father    Breast cancer Neg Hx     Social History   Socioeconomic History   Marital status: Widowed    Spouse name: Not on file   Number of children: 2   Years of education: Not on file   Highest education level: Some college, no degree  Occupational History   Not on file  Tobacco Use   Smoking status: Former    Types: Cigarettes   Smokeless tobacco: Never   Tobacco comments:    quit in mid to late 20's  Vaping Use   Vaping Use: Never used  Substance and Sexual Activity   Alcohol use: No    Comment: rare- 1 drink   Drug use: No   Sexual activity: Not on file  Other Topics Concern   Not on file  Social History Narrative   Not on file   Social Determinants of Health   Financial Resource Strain: Low Risk    Difficulty of Paying Living Expenses: Not hard at all  Food Insecurity: No Food Insecurity   Worried About Charity fundraiser in the Last Year: Never true   [place] in the Last Year: Never true  Transportation Needs: No Transportation Needs   Lack of Transportation (Medical): No   Lack of Transportation (Non-Medical): No  Physical Activity: Inactive   Days of Exercise per Week: 0 days   Minutes of Exercise per Session: 0 min  Stress: No Stress Concern Present   Feeling of Stress : Not at all  Social Connections: Moderately Isolated   Frequency of Communication with Friends and Family: More than three times a week   Frequency of Social Gatherings with Friends and Family: More than three times a week   Attends Religious Services: More than 4 times per year   Active Member of [employer] or Organizations: No   Attends Archivist Meetings: Never   Marital Status: Widowed    Tobacco Counseling Counseling given: Not Answered Tobacco comments: quit in mid to late 20's   Clinical Intake:  Pre-visit preparation completed: Yes  Pain : 0-10 Pain Score: 4  Pain Type: Chronic pain Pain Location: Back Pain Descriptors / Indicators: Constant Pain Frequency: Several days a week      Nutritional Risks: Unintentional weight gain Diabetes: No  How often do you need to have someone help you when you read instructions, pamphlets, or other written materials from your doctor or pharmacy?: 1 - Never What is the last grade level you completed in school?: Some College  Diabetic? No  Interpreter Needed?: No      Activities of Daily Living In your present state of health, do you have any difficulty performing the following activities: 11/02/2020  Hearing? Y  Vision? N  Difficulty concentrating or making decisions? N  Walking or climbing stairs? N  Dressing or bathing? N  Doing errands, shopping?  N  Preparing Food and eating ? N  Using the Toilet? N  In the past six months, have you accidently leaked urine? Y  Do you have problems with loss of bowel control? N  Managing your Medications? N  Managing your Finances? N  Housekeeping or managing your Housekeeping? N  Some recent data might be hidden    Patient Care Team: Mar Daring, PA-C as PCP - General (Family Medicine) Bary Castilla, Forest Gleason, MD (General Surgery) Leandrew Koyanagi, MD as Referring Physician (Ophthalmology) Margaretha Sheffield, MD (Otolaryngology)  Indicate any recent Medical Services you may have received from other than [facility] providers in the past year (date may be approximate).     Assessment:   This is a routine wellness examination for [place].  Hearing/Vision screen No results found.  Dietary issues and exercise activities discussed: Exercise limited by: None identified   Goals Addressed   None   Depression Screen PHQ 2/9 Scores 11/02/2020 06/19/2019 06/18/2018 06/18/2018 05/15/2017 05/15/2017 05/05/2016  PHQ - 2 Score 0 1 1 1  0 0 0  PHQ- 9 Score - - 1 - 4 - 4    Fall Risk Fall Risk  11/02/2020 06/19/2019 06/18/2018 03/20/2018 05/15/2017  Falls in the past year? 0 0 1 0 No  Number falls in past yr: 0 0 0 - -  Injury with Fall? 0 0 0 - -  Risk for fall due to : No Fall Risks - - - -   Follow up Falls evaluation completed - Falls prevention discussed - -    FALL RISK PREVENTION PERTAINING TO THE HOME:  Any stairs in or around the home? No  If so, are there any without handrails? No  Home free of loose throw rugs in walkways, pet beds, electrical cords, etc? Yes  Adequate lighting in your home to reduce risk of falls? Yes   ASSISTIVE DEVICES UTILIZED TO PREVENT FALLS:  Life alert? No  Use of a cane, walker or w/c? Yes  Grab bars in the bathroom? No  Shower chair or bench in shower? Yes  Elevated toilet seat or a handicapped toilet? No   TIMED UP AND GO:  Was the test performed? N/A.  Length of time to ambulate 10 feet: N/A sec.     Cognitive Function:     6CIT Screen 11/02/2020 06/19/2019 06/18/2018 05/15/2017 05/05/2016  What Year? 0 points 0 points 0 points 0 points 0 points  What month? 0 points 0 points 0 points 0 points 0 points  What time? 0 points 0 points 0 points 0 points 0 points  Count back from 20 0 points 0 points 0 points 0 points 0 points  Months in reverse 0 points 0 points 0 points 0 points 0 points  Repeat phrase 0 points 0 points 0 points 0 points 0 points  Total Score 0 0 0 0 0    Immunizations Immunization History  Administered Date(s) Administered   Fluad Quad(high Dose 65+) 10/22/2018   Influenza, High Dose Seasonal PF 11/21/2013, 11/05/2016, 11/28/2017   Pneumococcal Conjugate-13 07/05/2017   Pneumococcal-Unspecified 01/14/2013    TDAP status: Due, Education has been provided regarding the importance of this vaccine. Advised may receive this vaccine at local pharmacy or Health Dept. Aware to provide a copy of the vaccination record if obtained from local pharmacy or Health Dept. Verbalized acceptance and understanding.  Flu Vaccine status: Due, Education has been provided regarding the importance of this vaccine. Advised may receive this vaccine at local pharmacy or  Health Dept. Aware to provide a copy of the vaccination record if  obtained from local pharmacy or Health Dept. Verbalized acceptance and understanding.  Pneumococcal vaccine status: Up to date  Covid-19 vaccine status: Declined, Education has been provided regarding the importance of this vaccine but patient still declined. Advised may receive this vaccine at local pharmacy or Health Dept.or vaccine clinic. Aware to provide a copy of the vaccination record if obtained from local pharmacy or Health Dept. Verbalized acceptance and understanding.  Qualifies for Shingles Vaccine? No   Zostavax completed No   Shingrix Completed?: No.    Education has been provided regarding the importance of this vaccine. Patient has been advised to call insurance company to determine out of pocket expense if they have not yet received this vaccine. Advised may also receive vaccine at local pharmacy or Health Dept. Verbalized acceptance and understanding.  Screening Tests Health Maintenance  Topic Date Due   COVID-19 Vaccine (1) Never done   Zoster Vaccines- Shingrix (1 of 2) Never done   INFLUENZA VACCINE  09/07/2020   TETANUS/TDAP  02/07/2026 (Originally 09/15/1954)   HPV VACCINES  Aged Out   DEXA SCAN  Discontinued    Health Maintenance  Health Maintenance Due  Topic Date Due   COVID-19 Vaccine (1) Never done   Zoster Vaccines- Shingrix (1 of 2) Never done   INFLUENZA VACCINE  09/07/2020    Colorectal cancer screening: No longer required.   Mammogram status: No longer required due to  .  Bone Density status: Completed 10/08/2019. Results reflect: Bone density results: NORMAL. Repeat every   years.  Lung Cancer Screening: (Low Dose CT Chest recommended if Age 17-80 years, 30 pack-year currently smoking OR have quit w/in 15years.) does not qualify.   Lung Cancer Screening Referral: NO  Additional Screening:  Hepatitis C Screening: does not qualify; Completed  Vision Screening: Recommended annual ophthalmology exams for early detection of glaucoma and other  disorders of the eye. Is the patient up to date with their annual eye exam?  Yes  Who is the provider or what is the name of the office in which the patient attends annual eye exams? [facility] If pt is not established with a provider, would they like to be referred to a provider to establish care? No .   Dental Screening: Recommended annual dental exams for proper oral hygiene  Community Resource Referral / Chronic Care Management: CRR required this visit?  No   CCM required this visit?  No      Plan:     I have personally reviewed and noted the following in the patient's chart:   Medical and social history Use of alcohol, tobacco or illicit drugs  Current medications and supplements including opioid prescriptions.  Functional ability and status Nutritional status Physical activity Advanced directives List of other physicians Hospitalizations, surgeries, and ER visits in previous 12 months Vitals Screenings to include cognitive, depression, and falls Referrals and appointments  In addition, I have reviewed and discussed with patient certain preventive protocols, quality metrics, and best practice recommendations. A written personalized care plan for preventive services as well as general preventive health recommendations were provided to patient.  I connected with  Eileen Flores on 11/02/20 by a video enabled telemedicine application and verified that I am speaking with the correct person using two identifiers.   I discussed the limitations of evaluation and management by telemedicine. The patient expressed understanding and agreed to proceed.  Irena Reichmann, [facility]   11/02/2020   Nurse Notes: Non Face to Face 60 minutes

## 2020-11-24 ENCOUNTER — Emergency Department: Payer: Medicare HMO

## 2020-11-24 ENCOUNTER — Other Ambulatory Visit: Payer: Self-pay

## 2020-11-24 ENCOUNTER — Emergency Department
Admission: EM | Admit: 2020-11-24 | Discharge: 2020-11-24 | Disposition: A | Discharge status: Home / Self Care | Payer: Medicare HMO | Attending: Emergency Medicine | Admitting: Emergency Medicine

## 2020-11-24 ENCOUNTER — Ambulatory Visit: Payer: Self-pay | Admitting: *Deleted

## 2020-11-24 DIAGNOSIS — D72829 Elevated white blood cell count, unspecified: Secondary | ICD-10-CM | POA: Insufficient documentation

## 2020-11-24 DIAGNOSIS — Z79899 Other long term (current) drug therapy: Secondary | ICD-10-CM | POA: Diagnosis not present

## 2020-11-24 DIAGNOSIS — Z87891 Personal history of nicotine dependence: Secondary | ICD-10-CM | POA: Insufficient documentation

## 2020-11-24 DIAGNOSIS — R0902 Hypoxemia: Secondary | ICD-10-CM | POA: Diagnosis not present

## 2020-11-24 DIAGNOSIS — Z853 Personal history of malignant neoplasm of breast: Secondary | ICD-10-CM | POA: Diagnosis not present

## 2020-11-24 DIAGNOSIS — M549 Dorsalgia, unspecified: Secondary | ICD-10-CM | POA: Diagnosis not present

## 2020-11-24 DIAGNOSIS — K429 Umbilical hernia without obstruction or gangrene: Secondary | ICD-10-CM | POA: Diagnosis not present

## 2020-11-24 DIAGNOSIS — M5459 Other low back pain: Secondary | ICD-10-CM | POA: Diagnosis not present

## 2020-11-24 DIAGNOSIS — I10 Essential (primary) hypertension: Secondary | ICD-10-CM | POA: Diagnosis not present

## 2020-11-24 DIAGNOSIS — R52 Pain, unspecified: Secondary | ICD-10-CM | POA: Diagnosis not present

## 2020-11-24 DIAGNOSIS — M545 Low back pain, unspecified: Secondary | ICD-10-CM | POA: Insufficient documentation

## 2020-11-24 DIAGNOSIS — E039 Hypothyroidism, unspecified: Secondary | ICD-10-CM | POA: Insufficient documentation

## 2020-11-24 DIAGNOSIS — K5730 Diverticulosis of large intestine without perforation or abscess without bleeding: Secondary | ICD-10-CM | POA: Diagnosis not present

## 2020-11-24 DIAGNOSIS — N3001 Acute cystitis with hematuria: Secondary | ICD-10-CM | POA: Diagnosis not present

## 2020-11-24 DIAGNOSIS — K449 Diaphragmatic hernia without obstruction or gangrene: Secondary | ICD-10-CM | POA: Diagnosis not present

## 2020-11-24 LAB — CBC WITH DIFFERENTIAL/PLATELET
Abs Immature Granulocytes: 0.03 10*3/uL (ref 0.00–0.07)
Basophils Absolute: 0.1 10*3/uL (ref 0.0–0.1)
Basophils Relative: 1 %
Eosinophils Absolute: 0.1 10*3/uL (ref 0.0–0.5)
Eosinophils Relative: 1 %
HCT: 45.8 % (ref 36.0–46.0)
Hemoglobin: 15.7 g/dL — ABNORMAL HIGH (ref 12.0–15.0)
Immature Granulocytes: 0 %
Lymphocytes Relative: 22 %
Lymphs Abs: 2.2 10*3/uL (ref 0.7–4.0)
MCH: 32.3 pg (ref 26.0–34.0)
MCHC: 34.3 g/dL (ref 30.0–36.0)
MCV: 94.2 fL (ref 80.0–100.0)
Monocytes Absolute: 0.7 10*3/uL (ref 0.1–1.0)
Monocytes Relative: 7 %
Neutro Abs: 7.1 10*3/uL (ref 1.7–7.7)
Neutrophils Relative %: 69 %
Platelets: 321 10*3/uL (ref 150–400)
RBC: 4.86 MIL/uL (ref 3.87–5.11)
RDW: 13.2 % (ref 11.5–15.5)
WBC: 10.2 10*3/uL (ref 4.0–10.5)
nRBC: 0 % (ref 0.0–0.2)

## 2020-11-24 LAB — URINALYSIS, COMPLETE (UACMP) WITH MICROSCOPIC
Bacteria, UA: NONE SEEN
Bilirubin Urine: NEGATIVE
Glucose, UA: NEGATIVE mg/dL
Ketones, ur: NEGATIVE mg/dL
Nitrite: NEGATIVE
Protein, ur: NEGATIVE mg/dL
Specific Gravity, Urine: 1.018 (ref 1.005–1.030)
pH: 5 (ref 5.0–8.0)

## 2020-11-24 LAB — COMPREHENSIVE METABOLIC PANEL
ALT: 13 U/L (ref 0–44)
AST: 18 U/L (ref 15–41)
Albumin: 3.9 g/dL (ref 3.5–5.0)
Alkaline Phosphatase: 79 U/L (ref 38–126)
Anion gap: 7 (ref 5–15)
BUN: 19 mg/dL (ref 8–23)
CO2: 28 mmol/L (ref 22–32)
Calcium: 9.4 mg/dL (ref 8.9–10.3)
Chloride: 102 mmol/L (ref 98–111)
Creatinine, Ser: 0.94 mg/dL (ref 0.44–1.00)
GFR, Estimated: >60 mL/min (ref >60)
Glucose, Bld: 104 mg/dL — ABNORMAL HIGH (ref 70–99)
Potassium: 4.1 mmol/L (ref 3.5–5.1)
Sodium: 137 mmol/L (ref 135–145)
Total Bilirubin: 0.5 mg/dL (ref 0.3–1.2)
Total Protein: 7.8 g/dL (ref 6.5–8.1)

## 2020-11-24 LAB — LIPASE, BLOOD: Lipase: 26 U/L (ref 11–51)

## 2020-11-24 MED ORDER — CEPHALEXIN 500 MG PO CAPS: 500.0000 mg | ORAL_CAPSULE | Freq: Four times a day (QID) | ORAL | 0 refills | Status: DC

## 2020-11-24 MED ORDER — HYDROCODONE-ACETAMINOPHEN 5-325 MG PO TABS: 1.0000 | ORAL_TABLET | Freq: Four times a day (QID) | ORAL | 0 refills | Status: AC | PRN

## 2020-11-24 MED ORDER — ONDANSETRON 4 MG PO TBDP
4.0000 mg | ORAL_TABLET | Freq: Once | ORAL | Status: AC
  Administered 2020-11-24: 4 mg via ORAL
  Filled 2020-11-24: qty 1

## 2020-11-24 MED ORDER — HYDROCODONE-ACETAMINOPHEN 5-325 MG PO TABS: 1.0000 | ORAL_TABLET | Freq: Four times a day (QID) | ORAL | 0 refills | Status: DC | PRN

## 2020-11-24 MED ORDER — ONDANSETRON 4 MG PO TBDP: 4.0000 mg | ORAL_TABLET | Freq: Three times a day (TID) | ORAL | 0 refills | Status: DC | PRN

## 2020-11-24 MED ORDER — OXYCODONE-ACETAMINOPHEN 5-325 MG PO TABS
1.0000 | ORAL_TABLET | Freq: Once | ORAL | Status: AC
Start: 2020-11-24 — End: 2020-11-24
  Administered 2020-11-24: 1 via ORAL
  Filled 2020-11-24: qty 1

## 2020-11-24 MED ORDER — SODIUM CHLORIDE 0.9 % IV SOLN
1.0000 g | Freq: Once | INTRAVENOUS | Status: AC
  Administered 2020-11-24: 1 g via INTRAVENOUS
  Filled 2020-11-24: qty 10

## 2020-11-24 MED ORDER — ONDANSETRON 4 MG PO TBDP: 4.0000 mg | ORAL_TABLET | Freq: Three times a day (TID) | ORAL | 0 refills | Status: AC | PRN

## 2020-11-24 NOTE — ED Provider Notes (Signed)
[facility]-EMERGENCY DEPARTMENT  ____________________________________________  Time seen: Approximately 6:33 PM  I have reviewed the triage vital signs and the nursing notes.   HISTORY  Chief Complaint Back Pain   Historian Patient     HPI Eileen Flores is a 85 y.o. female with a history of breast cancer, presents to the emergency department with 10 out of 10 bilateral low back pain.  Back pain has occurred for the past 2 to 3 days and patient has been unable to ambulate without a walker.  She denies chest pain, chest tightness or abdominal pain.  She denies any history of hypertension and states that she takes levothyroxine and topical atenolol but no other medications.  She denies dysuria or increased urinary frequency.  No history of nephrolithiasis.   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%; PR 90%; [facility] January 2015, DECLINED ANTI-ESTROGEN TREATMENT   Mammographic microcalcification    Wears hearing aid     Patient Active Problem  List   Diagnosis Date Noted   Hyperlipidemia 06/14/2017   GERD (gastroesophageal reflux disease) 05/05/2016   Anxiety 11/16/2015   Arthritis 11/16/2015   Bunion 11/16/2015   Bursitis 11/16/2015   Colon polyp 11/16/2015   DD (diverticular disease) 11/16/2015   Hammer toe 11/16/2015   Acquired hypothyroidism 11/16/2015   Tendinitis 11/16/2015   History of breast cancer 02/21/2013   DCIS (ductal carcinoma in situ) of breast 01/17/2013   Papilloma of breast 01/17/2013   Breast neoplasm, Tis (DCIS), left 01/07/2013    Past Surgical History:  Procedure Laterality Date   BREAST BIOPSY Left 2014   stereo biopsy   BREAST EXCISIONAL BIOPSY Right 2014   papilloma   BREAST LUMPECTOMY Left 2014   BREAST SURGERY Left Oct 2012   benign stereo biopsy   BREAST SURGERY Right 01-14-13   excision breast mass, intraductal papilloma   BREAST SURGERY Left 01-14-13   Excision intra-papillary carcinoma, DCIS, ER 90%, PR 90%.   cataract surgery Bilateral 2009   COLONOSCOPY  2011   Dr. Bary Castilla   dermbrasion   1965   OVARIAN CYST REMOVAL  1967   POLYPECTOMY  2006   THYROIDECTOMY  2006   TONGUE SURGERY  2009    Prior to Admission medications   Medication Sig Start Date End Date Taking? Authorizing Provider  cephALEXin (KEFLEX) 500 MG capsule Take 1 capsule (500 mg  total) by mouth 4 (four) times daily for 7 days. 11/24/20 12/01/20 Yes Vallarie Mare M, PA-C  HYDROcodone-acetaminophen (NORCO/VICODIN) 5-325 MG tablet Take 1 tablet by mouth every 6 (six) hours as needed for up to 3 days for moderate pain. 11/24/20 11/27/20 Yes Vallarie Mare M, PA-C  ondansetron (ZOFRAN ODT) 4 MG disintegrating tablet Take 1 tablet (4 mg total) by mouth every 8 (eight) hours as needed for up to 5 days. 11/24/20 11/29/20 Yes Vallarie Mare M, PA-C  Bioflavonoid Products (VITAMIN C) CHEW Chew by mouth.    [provider]  Calcium Carbonate-Vit D-Min (CALCIUM 1200 PO) Take by mouth.    [provider]   Cholecalciferol (VITAMIN D3) 50 MCG (2000 UT) capsule Take 2,000 Units by mouth daily.    [provider]  fluticasone (FLONASE SENSIMIST) 27.5 MCG/SPRAY nasal spray Place 2 sprays into the nose daily. 07/10/19   Mar Daring, PA-C  levothyroxine (SYNTHROID) 112 MCG tablet Take 1 tablet (112 mcg total) by mouth daily before breakfast. 06/15/18   Mar Daring, PA-C  Masks (SURGICAL FACE MASK/NIOSH N95) MISC Surgical mask for daily use while out in puclic 02/09/43   Burnette, Clearnce Sorrel, PA-C  Melatonin 1 MG CAPS Take by mouth.    [provider]  meloxicam (MOBIC) 15 MG tablet TAKE 1 TABLET BY MOUTH EVERY DAY AS NEEDED FOR PAIN Patient not taking: Reported on 11/02/2020 01/29/19   Virginia Crews, MD  Multiple Vitamins-Minerals (ZINC PO) Take by mouth.    [provider]  timolol (TIMOPTIC) 0.5 % ophthalmic solution Place 1 drop into both eyes daily. 02/10/14   [provider]    Allergies Penicillin g and Penicillins  Family History  Problem Relation Age of Onset   Colon cancer Other    Colon polyps Other    Cancer Mother 64       liver cancer   Cancer Father 46       liver cancer   Alcohol abuse Father    Breast cancer Neg Hx     Social History Social History   Tobacco Use   Smoking status: Former    Types: Cigarettes   Smokeless tobacco: Never   Tobacco comments:    quit in mid to late 20's  Vaping Use   Vaping Use: Never used  Substance Use Topics   Alcohol use: No    Comment: rare- 1 drink   Drug use: No     Review of Systems  Constitutional: No fever/chills Eyes:  No discharge ENT: No upper respiratory complaints. Respiratory: no cough. No SOB/ use of accessory muscles to breath Gastrointestinal:   No nausea, no vomiting.  No diarrhea.  No constipation. Musculoskeletal: Patient has low back pain.  Skin: Negative for rash, abrasions, lacerations,  ecchymosis.    ____________________________________________   PHYSICAL EXAM:  VITAL SIGNS: ED Triage Vitals  Enc Vitals Group     BP 11/24/20 1602 (!) 207/80     Pulse Rate 11/24/20 1602 78     Resp 11/24/20 1602 17     Temp 11/24/20 1602 97.8 F (36.6 C)     Temp Source 11/24/20 1602 Oral     SpO2 11/24/20 1602 95 %     Weight 11/24/20 1649 210 lb (95.3 kg)     Height 11/24/20 1649 5\' 8"  (1.727 m)     Head Circumference --      Peak Flow --      Pain Score 11/24/20 1648 2  Pain Loc --      Pain Edu? --      Excl. in [place]? --      Constitutional: Alert and oriented. Well appearing and in no acute distress. Eyes: Conjunctivae are normal. PERRL. EOMI. Head: Atraumatic. ENT:      Nose: No congestion/rhinnorhea.      Mouth/Throat: Mucous membranes are moist.  Neck: No stridor.  No cervical spine tenderness to palpation. Cardiovascular: Normal rate, regular rhythm. Normal S1 and S2.  Good peripheral circulation. Respiratory: Normal respiratory effort without tachypnea or retractions. Lungs CTAB. Good air entry to the bases with no decreased or absent breath sounds Gastrointestinal: Bowel sounds x 4 quadrants. Soft and nontender to palpation. No guarding or rigidity. No distention. Musculoskeletal: Full range of motion to all extremities. No obvious deformities noted Neurologic:  Normal for age. No gross focal neurologic deficits are appreciated.  Skin:  Skin is warm, dry and intact. No rash noted. Psychiatric: Mood and affect are normal for age. Speech and behavior are normal.   ____________________________________________   LABS (all labs ordered are listed, but only abnormal results are displayed)  Labs Reviewed  CBC WITH DIFFERENTIAL/PLATELET - Abnormal; Notable for the following components:      Result Value   Hemoglobin 15.7 (*)    All other components within normal limits  COMPREHENSIVE METABOLIC PANEL - Abnormal; Notable for the following components:    Glucose, Bld 104 (*)    All other components within normal limits  URINALYSIS, COMPLETE (UACMP) WITH MICROSCOPIC - Abnormal; Notable for the following components:   Color, Urine YELLOW (*)    APPearance HAZY (*)    Hgb urine dipstick MODERATE (*)    Leukocytes,Ua LARGE (*)    All other components within normal limits  LIPASE, BLOOD   ____________________________________________  EKG   ____________________________________________  RADIOLOGY Unk Pinto, personally viewed and evaluated these images (plain radiographs) as part of my medical decision making, as well as reviewing the written report by the radiologist.    CT Renal Stone Study  Result Date: 11/24/2020 CLINICAL DATA:  Back pain EXAM: CT ABDOMEN AND PELVIS WITHOUT CONTRAST TECHNIQUE: Multidetector CT imaging of the abdomen and pelvis was performed following the standard protocol without IV contrast. COMPARISON:  None. FINDINGS: Lower chest: Lung bases demonstrate no acute consolidation or pleural effusion. Mild subpleural reticulation. Small right anterior lung base pulmonary nodules measuring up to 5 mm in size. Moderate hiatal hernia Hepatobiliary: No focal liver abnormality is seen. No gallstones, gallbladder wall thickening, or biliary dilatation. Pancreas: Unremarkable. No pancreatic ductal dilatation or surrounding inflammatory changes. Spleen: Normal in size without focal abnormality. Adrenals/Urinary Tract: Adrenal glands are normal. Parapelvic cysts on the left. No hydronephrosis or ureteral stone. The bladder is normal Stomach/Bowel: Stomach is within normal limits. Appendix appears normal. No evidence of bowel wall thickening, distention, or inflammatory changes. Diverticular disease of the left colon without acute wall thickening. Vascular/Lymphatic: Mild aortic atherosclerosis. No aneurysm. No suspicious nodes Reproductive: Uterus and bilateral adnexa are unremarkable. Other: Negative for pelvic effusion or free  air. Small fat containing periumbilical hernia. Musculoskeletal: Scoliosis and multilevel degenerative changes of the spine. No acute osseous abnormality. IMPRESSION: 1. Negative for hydronephrosis or ureteral stone. No CT evidence for acute intra-abdominal or pelvic abnormality. 2. Small pulmonary nodules in the right lung base. No follow-up needed if patient is low-risk (and has no known or suspected primary neoplasm). Non-contrast chest CT can be considered in 12 months if patient is high-risk. This  recommendation follows the consensus statement: Guidelines for Management of Incidental Pulmonary Nodules Detected on CT Images: From the Fleischner Society 2017; Radiology 2017; 284:228-243. 3. Diverticular disease of the colon without acute wall thickening. Electronically Signed   By: Donavan Foil M.D.   On: 11/24/2020 20:19    ____________________________________________    PROCEDURES  Procedure(s) performed:     Procedures     Medications  oxyCODONE-acetaminophen (PERCOCET/ROXICET) 5-325 MG per tablet 1 tablet (1 tablet Oral Given 11/24/20 1917)  ondansetron (ZOFRAN-ODT) disintegrating tablet 4 mg (4 mg Oral Given 11/24/20 1918)  cefTRIAXone (ROCEPHIN) 1 g in sodium chloride 0.9 % 100 mL IVPB (1 g Intravenous New Bag/Given 11/24/20 2119)     ____________________________________________   INITIAL IMPRESSION / ASSESSMENT AND PLAN / ED COURSE  Pertinent labs & imaging results that were available during my care of the patient were reviewed by me and considered in my medical decision making (see chart for details).      Assessment and plan:  Low back pain UTI 85 year old female presents to the emergency department with new onset low back pain.  Vital signs were reassuring at triage.  On physical exam, patient seemed uncomfortable but was able to stand and ambulate.  CBC and CMP were reassuring.  Lipase was within reference range.  Urinalysis indicated a large amount of  leukocytes and a moderate amount of blood.  No evidence of nephrolithiasis on CT renal stone study.  Patient was given an injection of Rocephin and monitored in the emergency department.  Patient did report that she has a penicillin allergy in childhood.  She had no evidence of rash at discharge.  She was discharged with Keflex to be taken 4 times daily for the next 7 days.  Return precautions were given to return with new or worsening symptoms.    ____________________________________________  FINAL CLINICAL IMPRESSION(S) / ED DIAGNOSES  Final diagnoses:  Acute bilateral low back pain without sciatica  Acute cystitis with hematuria      NEW MEDICATIONS STARTED DURING THIS VISIT:  ED Discharge Orders          Ordered    cephALEXin (KEFLEX) 500 MG capsule  4 times daily        11/24/20 2200    HYDROcodone-acetaminophen (NORCO/VICODIN) 5-325 MG tablet  Every 6 hours PRN        11/24/20 2200    ondansetron (ZOFRAN ODT) 4 MG disintegrating tablet  Every 8 hours PRN        11/24/20 2200                This chart was dictated using voice recognition software/Dragon. Despite best efforts to proofread, errors can occur which can change the meaning. Any change was purely unintentional.     Lannie Fields, PA-C 11/24/20 2206    Harvest Dark, MD 11/24/20 [phone]

## 2020-11-24 NOTE — Telephone Encounter (Signed)
Noted  

## 2020-11-24 NOTE — Telephone Encounter (Signed)
Reason for Disposition  [1] Pain radiates into the thigh or further down the leg AND [2] both legs  Answer Assessment - Initial Assessment Questions 1. ONSET: "When did the pain begin?"      1 week 2. LOCATION: "Where does it hurt?" (upper, mid or lower back)     Lower back- mid line 3. SEVERITY: "How bad is the pain?"  (e.g., Scale 1-10; mild, moderate, or severe)   - MILD (1-3): doesn't interfere with normal activities    - MODERATE (4-7): interferes with normal activities or awakens from sleep    - SEVERE (8-10): excruciating pain, unable to do any normal activities      severe 4. PATTERN: "Is the pain constant?" (e.g., yes, no; constant, intermittent)      Not constant 5. RADIATION: "Does the pain shoot into your legs or elsewhere?"     Around to thighs 6. CAUSE:  "What do you think is causing the back pain?"      Not sure 7. BACK OVERUSE:  "Any recent lifting of heavy objects, strenuous work or exercise?"     No 8. MEDICATIONS: "What have you taken so far for the pain?" (e.g., nothing, acetaminophen, NSAIDS)     Heat only 9. NEUROLOGIC SYMPTOMS: "Do you have any weakness, numbness, or problems with bowel/bladder control?"     no 10. OTHER SYMPTOMS: "Do you have any other symptoms?" (e.g., fever, abdominal pain, burning with urination, blood in urine)       R knee pain 11. PREGNANCY: "Is there any chance you are pregnant?" (e.g., yes, no; LMP)       N/a  Protocols used: Back Pain-A-AH

## 2020-11-24 NOTE — Telephone Encounter (Signed)
Patient is calling with new onset lower back pain for 1 week. Patient states the pain does at times radiate in to the thigh. Patient has been using heat for comfort- but it is not getting better. Patient advised UC for evaluation.

## 2020-11-24 NOTE — Discharge Instructions (Signed)
Take Keflex four times daily for the next seven days.  You can take Norco for pain.

## 2020-11-24 NOTE — ED Provider Notes (Signed)
Emergency Medicine Provider Triage Evaluation Note  Eileen Flores, a 85 y.o. female  was evaluated in triage.  Pt complains of lower back pain. She notes a history of confirmed OA to the right knee and notes chronic knee pain, but does not endorse chronic LBP. She had a recent bone density study showing significant osteoporosis. She denies any recent trauma or fall. She also denies bladder/bowel incontinence, foot drop or saddle anesthesias.   Review of Systems  Positive: LBP Negative: Distal paresthesias  Physical Exam  BP (!) 207/80 (BP Location: Right Arm)   Pulse 78   Temp 97.8 F (36.6 C) (Oral)   Resp 17   SpO2 95%  Gen:   Awake, no distress  NAD Resp:  Normal effort CTA MSK:   Moves extremities without difficulty  Other:  CVS: RR  Medical Decision Making  Medically screening exam initiated at 4:18 PM.  Appropriate orders placed.  YURITZA PAULHUS was informed that the remainder of the evaluation will be completed by another provider, this initial triage assessment does not replace that evaluation, and the importance of remaining in the ED until their evaluation is complete.  Patient with reports of acute midline LBP without recent injury/trauma.    Melvenia Needles, PA-C 11/24/20 1622    Rada Hay, MD 11/24/20 [phone]

## 2020-11-24 NOTE — ED Triage Notes (Signed)
Pt to ED [facility] from home for lower back pain x1 week.  Jenise PA MSE in triage

## 2020-11-24 NOTE — ED Notes (Signed)
See triage note. Pt states she has Hx of osteoarthritis. Sx onset approx 1 week ago when she sat on a soft couch that didn't offer any back support. Pain has become intolerable. Pt denies and urinary or bowel changes. Pain radiates bilaterally to anterior thighs.

## 2020-11-25 LAB — URINE CULTURE: Culture: <10000 — AB

## 2020-11-27 ENCOUNTER — Telehealth: Payer: Self-pay

## 2020-11-27 ENCOUNTER — Emergency Department
Admission: EM | Admit: 2020-11-27 | Discharge: 2020-11-27 | Disposition: A | Discharge status: Home / Self Care | Payer: Medicare HMO | Attending: Emergency Medicine | Admitting: Emergency Medicine

## 2020-11-27 ENCOUNTER — Emergency Department: Payer: Medicare HMO

## 2020-11-27 ENCOUNTER — Other Ambulatory Visit: Payer: Self-pay

## 2020-11-27 DIAGNOSIS — R109 Unspecified abdominal pain: Secondary | ICD-10-CM | POA: Insufficient documentation

## 2020-11-27 DIAGNOSIS — Z853 Personal history of malignant neoplasm of breast: Secondary | ICD-10-CM | POA: Insufficient documentation

## 2020-11-27 DIAGNOSIS — K5730 Diverticulosis of large intestine without perforation or abscess without bleeding: Secondary | ICD-10-CM | POA: Diagnosis not present

## 2020-11-27 DIAGNOSIS — J9811 Atelectasis: Secondary | ICD-10-CM | POA: Diagnosis not present

## 2020-11-27 DIAGNOSIS — M545 Low back pain, unspecified: Secondary | ICD-10-CM | POA: Diagnosis not present

## 2020-11-27 DIAGNOSIS — Z87891 Personal history of nicotine dependence: Secondary | ICD-10-CM | POA: Insufficient documentation

## 2020-11-27 DIAGNOSIS — S22009A Unspecified fracture of unspecified thoracic vertebra, initial encounter for closed fracture: Secondary | ICD-10-CM | POA: Diagnosis not present

## 2020-11-27 DIAGNOSIS — I10 Essential (primary) hypertension: Secondary | ICD-10-CM | POA: Insufficient documentation

## 2020-11-27 DIAGNOSIS — M549 Dorsalgia, unspecified: Secondary | ICD-10-CM | POA: Diagnosis not present

## 2020-11-27 DIAGNOSIS — R0902 Hypoxemia: Secondary | ICD-10-CM | POA: Diagnosis not present

## 2020-11-27 DIAGNOSIS — R911 Solitary pulmonary nodule: Secondary | ICD-10-CM | POA: Diagnosis not present

## 2020-11-27 DIAGNOSIS — Z743 Need for continuous supervision: Secondary | ICD-10-CM | POA: Diagnosis not present

## 2020-11-27 DIAGNOSIS — K449 Diaphragmatic hernia without obstruction or gangrene: Secondary | ICD-10-CM | POA: Diagnosis not present

## 2020-11-27 LAB — URINALYSIS, COMPLETE (UACMP) WITH MICROSCOPIC
Bacteria, UA: NONE SEEN
Bilirubin Urine: NEGATIVE
Glucose, UA: NEGATIVE mg/dL
Ketones, ur: NEGATIVE mg/dL
Nitrite: NEGATIVE
Protein, ur: NEGATIVE mg/dL
Specific Gravity, Urine: 1.009 (ref 1.005–1.030)
pH: 5 (ref 5.0–8.0)

## 2020-11-27 LAB — CBC
HCT: 43.8 % (ref 36.0–46.0)
Hemoglobin: 14.7 g/dL (ref 12.0–15.0)
MCH: 31.8 pg (ref 26.0–34.0)
MCHC: 33.6 g/dL (ref 30.0–36.0)
MCV: 94.8 fL (ref 80.0–100.0)
Platelets: 329 10*3/uL (ref 150–400)
RBC: 4.62 MIL/uL (ref 3.87–5.11)
RDW: 12.8 % (ref 11.5–15.5)
WBC: 10.6 10*3/uL — ABNORMAL HIGH (ref 4.0–10.5)
nRBC: 0 % (ref 0.0–0.2)

## 2020-11-27 LAB — COMPREHENSIVE METABOLIC PANEL
ALT: 13 U/L (ref 0–44)
AST: 19 U/L (ref 15–41)
Albumin: 3.7 g/dL (ref 3.5–5.0)
Alkaline Phosphatase: 74 U/L (ref 38–126)
Anion gap: 9 (ref 5–15)
BUN: 19 mg/dL (ref 8–23)
CO2: 29 mmol/L (ref 22–32)
Calcium: 8.9 mg/dL (ref 8.9–10.3)
Chloride: 100 mmol/L (ref 98–111)
Creatinine, Ser: 0.91 mg/dL (ref 0.44–1.00)
GFR, Estimated: >60 mL/min (ref >60)
Glucose, Bld: 116 mg/dL — ABNORMAL HIGH (ref 70–99)
Potassium: 3.9 mmol/L (ref 3.5–5.1)
Sodium: 138 mmol/L (ref 135–145)
Total Bilirubin: 0.5 mg/dL (ref 0.3–1.2)
Total Protein: 7.4 g/dL (ref 6.5–8.1)

## 2020-11-27 MED ORDER — ONDANSETRON HCL 4 MG/2ML IJ SOLN
4.0000 mg | Freq: Once | INTRAMUSCULAR | Status: AC
  Administered 2020-11-27: 4 mg via INTRAVENOUS

## 2020-11-27 MED ORDER — IOHEXOL 350 MG/ML SOLN
100.0000 mL | Freq: Once | INTRAVENOUS | Status: AC | PRN
  Administered 2020-11-27: 100 mL via INTRAVENOUS

## 2020-11-27 MED ORDER — LIDOCAINE 5 % EX PTCH
1.0000 | MEDICATED_PATCH | CUTANEOUS | Status: DC
  Administered 2020-11-27: 1 via TRANSDERMAL
  Filled 2020-11-27: qty 1

## 2020-11-27 MED ORDER — CEPHALEXIN 500 MG PO CAPS: 500.0000 mg | ORAL_CAPSULE | Freq: Two times a day (BID) | ORAL | 0 refills | Status: DC

## 2020-11-27 MED ORDER — TRAMADOL HCL 50 MG PO TABS: 50.0000 mg | ORAL_TABLET | Freq: Four times a day (QID) | ORAL | 0 refills | Status: DC | PRN

## 2020-11-27 MED ORDER — MORPHINE SULFATE (PF) 4 MG/ML IV SOLN
4.0000 mg | Freq: Once | INTRAVENOUS | Status: AC
  Administered 2020-11-27: 4 mg via INTRAVENOUS
  Filled 2020-11-27: qty 1

## 2020-11-27 MED ORDER — ONDANSETRON HCL 4 MG/2ML IJ SOLN
INTRAMUSCULAR | Status: AC
  Filled 2020-11-27: qty 2

## 2020-11-27 NOTE — ED Notes (Signed)
Pt transported to radiology.

## 2020-11-27 NOTE — ED Provider Notes (Signed)
[facility] Emergency Department Provider Note  ____________________________________________   Event Date/Time   First MD Initiated Contact with Patient 11/27/20 [phone]     (approximate)  I have reviewed the triage vital signs and the nursing notes.   HISTORY  Chief Complaint Back Pain   HPI Eileen Flores is a 85 y.o. female with a past medical history of arthritis, breast cancer, HTN, HDL, arthritis including in the back and recent ED visit on 10/18 for assessment of some acute on chronic low back pain discharged with a presumptive diagnosis of possible UTI presents accompanied by son for some ongoing low back pain not improving on antibiotics for over the Norco prescribed.  Patient does not recall any recent injuries or falls, acute lower extremity weakness, and numbness or any other lower extremity symptoms.  Endorses some chronic urinary incontinence but has not had any fecal incontinence or significant constipation or diarrhea.  She states she is developed some generalized abdominal crampiness of the last day or so but has not had any nausea, vomiting, diarrhea, chest pain, cough, fevers, shortness of breath, headache or earache, sore throat or any other clear associated sick symptoms.  She states this is similar to prior arthritic pain she has had in the past although it is much worse than usual.  No other acute concerns at this time.        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%; PR 90%; MammoSite January 2015, DECLINED ANTI-ESTROGEN TREATMENT   Mammographic microcalcification    Wears  hearing aid     Patient Active Problem List   Diagnosis Date Noted   Hyperlipidemia 06/14/2017   GERD (gastroesophageal reflux disease) 05/05/2016   Anxiety 11/16/2015   Arthritis 11/16/2015   Bunion 11/16/2015   Bursitis 11/16/2015   Colon polyp 11/16/2015   DD (diverticular disease) 11/16/2015   Hammer toe 11/16/2015   Acquired hypothyroidism 11/16/2015   Tendinitis 11/16/2015   History of breast cancer 02/21/2013   DCIS (ductal carcinoma in situ) of breast 01/17/2013   Papilloma of breast 01/17/2013   Breast neoplasm, Tis (DCIS), left 01/07/2013    Past Surgical History:  Procedure Laterality Date   BREAST BIOPSY Left 2014   stereo biopsy   BREAST EXCISIONAL BIOPSY Right 2014   papilloma   BREAST LUMPECTOMY Left 2014   BREAST SURGERY Left Oct 2012   benign stereo biopsy   BREAST SURGERY Right 01-14-13   excision breast mass, intraductal papilloma   BREAST SURGERY Left 01-14-13   Excision intra-papillary carcinoma, DCIS, ER 90%, PR 90%.   cataract surgery Bilateral 2009   COLONOSCOPY  2011   Dr. Bary Castilla   dermbrasion   1965   OVARIAN CYST REMOVAL  1967   POLYPECTOMY  2006   THYROIDECTOMY  2006   TONGUE SURGERY  2009    Prior to Admission medications   Medication Sig Start Date End Date Taking? Authorizing Provider  Bioflavonoid Products (VITAMIN C) CHEW Chew by mouth.    [provider]  Calcium Carbonate-Vit D-Min (CALCIUM 1200  PO) Take by mouth.    [provider]  cephALEXin (KEFLEX) 500 MG capsule Take 1 capsule (500 mg total) by mouth 4 (four) times daily for 7 days. 11/24/20 12/01/20  Lannie Fields, PA-C  Cholecalciferol (VITAMIN D3) 50 MCG (2000 UT) capsule Take 2,000 Units by mouth daily.    [provider]  fluticasone (FLONASE SENSIMIST) 27.5 MCG/SPRAY nasal spray Place 2 sprays into the nose daily. 07/10/19   Mar Daring, PA-C  HYDROcodone-acetaminophen (NORCO/VICODIN) 5-325 MG tablet Take 1 tablet by mouth every 6  (six) hours as needed for up to 3 days for moderate pain. 11/24/20 11/27/20  Lannie Fields, PA-C  levothyroxine (SYNTHROID) 112 MCG tablet Take 1 tablet (112 mcg total) by mouth daily before breakfast. 06/15/18   Mar Daring, PA-C  Masks (SURGICAL FACE MASK/NIOSH N95) MISC Surgical mask for daily use while out in puclic 10/13/73   Burnette, Clearnce Sorrel, PA-C  Melatonin 1 MG CAPS Take by mouth.    [provider]  meloxicam (MOBIC) 15 MG tablet TAKE 1 TABLET BY MOUTH EVERY DAY AS NEEDED FOR PAIN Patient not taking: Reported on 11/02/2020 01/29/19   Virginia Crews, MD  Multiple Vitamins-Minerals (ZINC PO) Take by mouth.    [provider]  ondansetron (ZOFRAN ODT) 4 MG disintegrating tablet Take 1 tablet (4 mg total) by mouth every 8 (eight) hours as needed for up to 5 days. 11/24/20 11/29/20  Lannie Fields, PA-C  timolol (TIMOPTIC) 0.5 % ophthalmic solution Place 1 drop into both eyes daily. 02/10/14   [provider]    Allergies Penicillin g and Penicillins  Family History  Problem Relation Age of Onset   Colon cancer Other    Colon polyps Other    Cancer Mother 25       liver cancer   Cancer Father 7       liver cancer   Alcohol abuse Father    Breast cancer Neg Hx     Social History Social History   Tobacco Use   Smoking status: Former    Types: Cigarettes   Smokeless tobacco: Never   Tobacco comments:    quit in mid to late 20's  Vaping Use   Vaping Use: Never used  Substance Use Topics   Alcohol use: No    Comment: rare- 1 drink   Drug use: No    Review of Systems  Review of Systems  Constitutional:  Negative for chills and fever.  HENT:  Negative for sore throat.   Eyes:  Negative for pain.  Respiratory:  Negative for cough and stridor.   Cardiovascular:  Negative for chest pain.  Gastrointestinal:  Positive for abdominal pain. Negative for vomiting.  Genitourinary:  Negative for dysuria.  Musculoskeletal:  Positive for  back pain.  Skin:  Negative for rash.  Neurological:  Negative for seizures, loss of consciousness and headaches.  Psychiatric/Behavioral:  Negative for suicidal ideas.   All other systems reviewed and are negative.    ____________________________________________   PHYSICAL EXAM:  VITAL SIGNS: ED Triage Vitals  Enc Vitals Group     BP 11/27/20 0137 (!) 165/79     Pulse Rate 11/27/20 0137 72     Resp 11/27/20 0137 20     Temp 11/27/20 0137 97.9 F (36.6 C)     Temp Source 11/27/20 0137 Oral     SpO2 11/27/20 0137 95 %     Weight 11/27/20 0138 209 lb 7 oz (95  kg)     Height 11/27/20 0138 5\' 8"  (1.727 m)     Head Circumference --      Peak Flow --      Pain Score 11/27/20 0137 10     Pain Loc --      Pain Edu? --      Excl. in [place]? --    Vitals:   11/27/20 0137 11/27/20 0618  BP: (!) 165/79 (!) 166/62  Pulse: 72 67  Resp: 20 16  Temp: 97.9 F (36.6 C)   SpO2: 95% 98%   Physical Exam Vitals and nursing note reviewed.  Constitutional:      General: She is not in acute distress.    Appearance: She is well-developed.  HENT:     Head: Normocephalic and atraumatic.     Right Ear: External ear normal.     Left Ear: External ear normal.     Nose: Nose normal.  Eyes:     Conjunctiva/sclera: Conjunctivae normal.  Cardiovascular:     Rate and Rhythm: Normal rate and regular rhythm.     Heart sounds: No murmur heard. Pulmonary:     Effort: Pulmonary effort is normal. No respiratory distress.     Breath sounds: Normal breath sounds.  Abdominal:     Palpations: Abdomen is soft.     Tenderness: There is no abdominal tenderness. There is no right CVA tenderness or left CVA tenderness.  Musculoskeletal:     Cervical back: Neck supple.  Skin:    General: Skin is warm and dry.     Capillary Refill: Capillary refill takes less than 2 seconds.  Neurological:     Mental Status: She is alert and oriented to person, place, and time.  Psychiatric:        Mood and Affect: Mood  normal.    Some mild tenderness of the lower T-spine and upper L-spine.  No tenderness over the C-spine.  No overlying skin changes over the back or CVA tenderness.  Patient has symmetric strength in her bilateral lower extremities.  Sensation is intact to light touch of the bilateral extremities.  Lower extremities are warm and well-perfused. ____________________________________________   LABS (all labs ordered are listed, but only abnormal results are displayed)  Labs Reviewed  CBC - Abnormal; Notable for the following components:      Result Value   WBC 10.6 (*)    All other components within normal limits  COMPREHENSIVE METABOLIC PANEL - Abnormal; Notable for the following components:   Glucose, Bld 116 (*)    All other components within normal limits  URINALYSIS, COMPLETE (UACMP) WITH MICROSCOPIC - Abnormal; Notable for the following components:   Color, Urine STRAW (*)    APPearance HAZY (*)    Hgb urine dipstick MODERATE (*)    Leukocytes,Ua TRACE (*)    All other components within normal limits   ____________________________________________  EKG  ECG shows sinus rhythm with a ventricular rate of 66, some incomplete right bundle branch pattern although minimal to see V5 due to some artifact without other clearance of acute ischemia or significant arrhythmia. ____________________________________________  RADIOLOGY  ED MD interpretation:    Official radiology report(s): No results found.  ____________________________________________   PROCEDURES  Procedure(s) performed (including Critical Care):  .1-3 Lead EKG Interpretation Performed by: Lucrezia Starch, MD Authorized by: Lucrezia Starch, MD     Interpretation: non-specific     ECG rate assessment: normal     Rhythm: sinus rhythm  Ectopy: none     Conduction: normal     ____________________________________________   INITIAL IMPRESSION / ASSESSMENT AND PLAN / ED COURSE      Patient presents  with above state history exam for assessment of their 6 days of ongoing acute on chronic low back pain not improving with antibiotics after being discharged with a diagnosis of possible UTI.  No associated injuries or falls.  Patient is hypertensive with otherwise stable vital signs on arrival.  She does have some low T and upper L-spine tenderness.  She does not have any focal lower extremity neurological deficits.  Seems she is also developed some abdominal discomfort in the last day.  No other clear associated sick symptoms.  Differential includes ongoing cystitis, develop pyelonephritis, kidney stone, pathological spine fracture, acute on chronic arthritic pain in the spine, and possible spinal renal infarct.    History, exam and ECG not suggestive of atypical presentation for ACS.  CBC shows WBC count of 10.6 without evidence of acute anemia and normal platelets.  CMP shows no significant electrolyte or metabolic derangements.  UA shows moderate hemoglobin and trace leukocyte esterase.  I was able to review patient's work-up from her visit on the 18th.  At that time she had blood work and urinalysis done.  CMP unremarkable.  CBC unremarkable.  Lipase within normal limits.  UA with large leukocyte esterase and 21-50 WBCs and moderate hemoglobin.  Urine culture unfortunately did not get sufficient growth to identify any specific bacteria.  CT stone study at that time showed no hydronephrosis or visible stone or perinephric stranding.  No other clear acute abdominopelvic process at that time.  Will obtain noncontrast enhanced CT abdomen pelvis today to assess for blood pathologies addition to imaging of the T and L-spine.  Care patient signed out to assuming provider approximately 0 700.  Plan is to reassess patient after CT imaging and if unremarkable and she is feeling little better and able to ambulate follow-up with PCP.  ____________________________________________   FINAL CLINICAL  IMPRESSION(S) / ED DIAGNOSES  Final diagnoses:  Lower back pain    Medications  lidocaine (LIDODERM) 5 % 1 patch (1 patch Transdermal Patch Applied 11/27/20 0649)  morphine 4 MG/ML injection 4 mg (4 mg Intravenous Given 11/27/20 0650)  ondansetron (ZOFRAN) injection 4 mg (4 mg Intravenous See Procedure Record 11/27/20 0703)     ED Discharge Orders     None        Note:  This document was prepared using Dragon voice recognition software and may include unintentional dictation errors.    Lucrezia Starch, MD 11/27/20 [phone]

## 2020-11-27 NOTE — Discharge Instructions (Addendum)
As we discussed your CT scan does show an osteophyte/bone spur fracture in your low back.  Please use Tylenol 1000 mg every 8 hours as needed for discomfort.  You may use your prescribed tramadol if needed for severe discomfort.  Your scan also showed possible urinary infection please take your antibiotics as prescribed.  Return to the emergency department for any worsening pain, fever or any other symptom personally concerning to yourself.  As we discussed your CT scan also showed several incidental findings.  We are recommending that you have an MRI performed of your kidneys in approximately 3 months to ensure no concerning findings such as a mass.  We recommend that you have a mammogram performed given a calcified nodule in the left breast.  And we recommend that you have a repeat chest CT performed in 6 months given multiple enlarged pulmonary lymph nodes.  Please follow-up with your primary care doctor regarding these suggestions.

## 2020-11-27 NOTE — ED Provider Notes (Signed)
-----------------------------------------   12:04 PM on 11/27/2020 ----------------------------------------- Patient care assumed from Dr. Tamala Julian.  Patient appears well on my evaluation.  CT scan does show possible focal pyelonephritis patient's urinalysis does show some trace leukocytes we will cover with antibiotics as a precaution and send urine culture.  No other concerning findings on CT imaging I did discuss incidental findings of pulmonary nodules requiring repeat CT in 3 to 6 months as well as renal finding requiring MRI within the next 3 months as well as breast finding requiring mammography.  Patient's L-spine CT does show a fractured osteophyte which very likely could be the source of the patient's discomfort.  Recommended Tylenol for pain control, Ultram if needed for severe pain.  We will have the patient follow-up with her doctor.  Patient agreeable to plan of care.  Provided return precautions.   Harvest Dark, MD 11/27/20 1205

## 2020-11-27 NOTE — ED Triage Notes (Addendum)
Pt in with co lower back pain states was here last week for the same and dx with UTI. Pt is currently on antibiotics, no fever. No dysuria at this time, pt has hx of back pain due to arthritis. Pain is worse on movement and radiates down buttocks and posterior legs.

## 2020-11-27 NOTE — Telephone Encounter (Signed)
Copied from [place] [id number]. Topic: Appointment Scheduling - Scheduling Inquiry for Clinic >> Nov 27, 2020  2:51 PM Greggory Keen D wrote: Reason for CRM: Pt called saying she needs a hospital FU asap.  She has a fx in her back and he has a UTI.  She has been to the ER twice for pain.  CB#  [phone]

## 2020-11-27 NOTE — ED Notes (Signed)
Dc ppw provided. Pt assisted w dressing and assisted to [place] for ride via wheelchair. No questions at this time.

## 2020-11-29 LAB — URINE CULTURE: Culture: NO GROWTH

## 2020-12-11 DIAGNOSIS — H903 Sensorineural hearing loss, bilateral: Secondary | ICD-10-CM | POA: Diagnosis not present

## 2020-12-11 DIAGNOSIS — H6123 Impacted cerumen, bilateral: Secondary | ICD-10-CM | POA: Diagnosis not present

## 2020-12-21 ENCOUNTER — Encounter: Payer: Self-pay | Admitting: Family Medicine

## 2020-12-21 ENCOUNTER — Ambulatory Visit (INDEPENDENT_AMBULATORY_CARE_PROVIDER_SITE_OTHER): Payer: Medicare HMO | Admitting: Family Medicine

## 2020-12-21 ENCOUNTER — Other Ambulatory Visit: Payer: Self-pay

## 2020-12-21 VITALS — BP 177/75 | HR 65 | Resp 16 | Ht 68.0 in | Wt 210.7 lb

## 2020-12-21 DIAGNOSIS — E039 Hypothyroidism, unspecified: Secondary | ICD-10-CM

## 2020-12-21 DIAGNOSIS — E559 Vitamin D deficiency, unspecified: Secondary | ICD-10-CM | POA: Diagnosis not present

## 2020-12-21 DIAGNOSIS — M1990 Unspecified osteoarthritis, unspecified site: Secondary | ICD-10-CM

## 2020-12-21 DIAGNOSIS — E7800 Pure hypercholesterolemia, unspecified: Secondary | ICD-10-CM

## 2020-12-21 DIAGNOSIS — M8580 Other specified disorders of bone density and structure, unspecified site: Secondary | ICD-10-CM | POA: Diagnosis not present

## 2020-12-21 DIAGNOSIS — Z23 Encounter for immunization: Secondary | ICD-10-CM

## 2020-12-21 DIAGNOSIS — Z0000 Encounter for general adult medical examination without abnormal findings: Secondary | ICD-10-CM | POA: Diagnosis not present

## 2020-12-21 DIAGNOSIS — I10 Essential (primary) hypertension: Secondary | ICD-10-CM | POA: Diagnosis not present

## 2020-12-21 MED ORDER — HYDROCHLOROTHIAZIDE 12.5 MG PO TABS: 12.5000 mg | ORAL_TABLET | Freq: Every day | ORAL | 0 refills | Status: DC

## 2020-12-21 NOTE — Assessment & Plan Note (Signed)
Mixed hyperlipidemia Discussed lifestyle modifications Recheck lipid panel

## 2020-12-21 NOTE — Assessment & Plan Note (Signed)
Osteoarthritis of right knee Start volataren gel

## 2020-12-21 NOTE — Patient Instructions (Signed)
   The CDC recommends two doses of Shingrix (the shingles vaccine) separated by 2 to 6 months for adults age 83 years and older. I recommend checking with your insurance plan regarding coverage for this vaccine.   

## 2020-12-21 NOTE — Assessment & Plan Note (Signed)
New onset, uncontrolled Start hydrochlorothiazide 12.5mg 

## 2020-12-21 NOTE — Assessment & Plan Note (Addendum)
Previously well controlled Monitored by Dr. Kathyrn Sheriff Continue levothyroxine 112 mcg

## 2020-12-21 NOTE — Progress Notes (Signed)
Complete physical exam   Patient: Eileen Flores   DOB: March 11, 1935   85 y.o. Female  MRN: [id number] Visit Date: 12/21/2020  Today's healthcare provider: Lavon Paganini, MD   Chief Complaint  Patient presents with   Annual Exam   Subjective    Eileen Flores is a 85 y.o. female who presents today for a complete physical exam.  She reports consuming a general diet. The patient does not participate in regular exercise at present. She generally feels fairly well. She reports sleeping fairly well. She does not have additional problems to discuss today.   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   cataract surgery Bilateral 2009   COLONOSCOPY  2011   Dr. Bary Castilla   dermbrasion   1965   OVARIAN CYST REMOVAL  1967   POLYPECTOMY  2006   THYROIDECTOMY  2006   TONGUE  SURGERY  2009   Social History   Socioeconomic History   Marital status: Widowed    Spouse name: Not on file   Number of children: 2   Years of education: Not on file   Highest education level: Some college, no degree  Occupational History   Not on file  Tobacco Use   Smoking status: Former    Types: Cigarettes   Smokeless tobacco: Never   Tobacco comments:    quit in mid to late 20's  Vaping Use   Vaping Use: Never used  Substance and Sexual Activity   Alcohol use: No    Comment: rare- 1 drink   Drug use: No   Sexual activity: Not on file  Other Topics Concern   Not on file  Social History Narrative   Not on file   Social Determinants of Health   Financial Resource Strain: Low Risk    Difficulty of Paying Living Expenses: Not hard at all  Food Insecurity: No Food Insecurity   Worried About Charity fundraiser in the Last Year: Never true   [place] in the Last Year: Never true  Transportation Needs: No Transportation Needs   Lack of Transportation (Medical): No   Lack of Transportation (Non-Medical): No  Physical Activity: Inactive   Days of Exercise per Week: 0 days   Minutes of Exercise per Session:  0 min  Stress: No Stress Concern Present   Feeling of Stress : Not at all  Social Connections: Moderately Isolated   Frequency of Communication with Friends and Family: More than three times a week   Frequency of Social Gatherings with Friends and Family: More than three times a week   Attends Religious Services: More than 4 times per year   Active Member of [employer] or Organizations: No   Attends Archivist Meetings: Never   Marital Status: Widowed  Human resources officer Violence: Not At Risk   Fear of Current or Ex-Partner: No   Emotionally Abused: No   Physically Abused: No   Sexually Abused: No   Family Status  Relation Name Status   Other  Deceased   Mother  Deceased   Father  Deceased   Neg Hx  (Not Specified)   Family History  Problem  Relation Age of Onset   Colon cancer Other    Colon polyps Other    Cancer Mother 30       liver cancer   Cancer Father 99       liver cancer   Alcohol abuse Father    Breast cancer Neg Hx    Allergies  Allergen Reactions   Penicillin G Rash   Penicillins Rash    Patient Care Team: [name], FNP as PCP - General (Family Medicine) Bary Castilla, Forest Gleason, MD (General Surgery) Leandrew Koyanagi, MD as Referring Physician (Ophthalmology) Margaretha Sheffield, MD (Otolaryngology)   Medications: Outpatient Medications Prior to Visit  Medication Sig   Bioflavonoid Products (VITAMIN C) CHEW Chew by mouth.   Calcium Carb-Cholecalciferol 600-10 MG-MCG CHEW Chew by mouth.   levothyroxine (SYNTHROID) 112 MCG tablet Take 1 tablet (112 mcg total) by mouth daily before breakfast.   timolol (TIMOPTIC) 0.5 % ophthalmic solution Place 1 drop into both eyes daily.   Masks (SURGICAL FACE MASK/NIOSH N95) MISC Surgical mask for daily use while out in puclic   [DISCONTINUED] Calcium Carbonate-Vit D-Min (CALCIUM 1200 PO) Take by mouth. (Patient not taking: Reported on 12/21/2020)   [DISCONTINUED] cephALEXin (KEFLEX) 500 MG capsule Take 1 capsule (500 mg total) by mouth 2 (two) times daily.   [DISCONTINUED] Cholecalciferol (VITAMIN D3) 50 MCG (2000 UT) capsule Take 2,000 Units by mouth daily. (Patient not taking: Reported on 12/21/2020)   [DISCONTINUED] fluticasone (FLONASE SENSIMIST) 27.5 MCG/SPRAY nasal spray Place 2 sprays into the nose daily. (Patient not taking: Reported on 12/21/2020)   [DISCONTINUED] Melatonin 1 MG CAPS Take by mouth. (Patient not taking: Reported on 12/21/2020)   [DISCONTINUED] meloxicam (MOBIC) 15 MG tablet TAKE 1 TABLET BY MOUTH EVERY DAY AS NEEDED FOR PAIN (Patient not taking: No sig reported)   [DISCONTINUED] Multiple Vitamins-Minerals (ZINC PO) Take by mouth. (Patient not taking: Reported on 12/21/2020)   [DISCONTINUED] traMADol (ULTRAM) 50 MG tablet Take 1 tablet (50 mg total)  by mouth every 6 (six) hours as needed. (Patient not taking: Reported on 12/21/2020)   No facility-administered medications prior to visit.    Review of Systems  Constitutional: Negative.   HENT:  Positive for hearing loss.   Eyes: Negative.   Respiratory: Negative.    Cardiovascular:  Positive for leg swelling.  Gastrointestinal:  Positive for constipation.  Genitourinary:  Positive for frequency.  Musculoskeletal:  Positive for back pain and joint swelling.  Skin: Negative.   Neurological:  Positive for numbness.  Psychiatric/Behavioral: Negative.       Objective    BP (!) 177/75 (BP Location: Left  Arm, Patient Position: Sitting, Cuff Size: Large)   Pulse 65   Resp 16   Ht 5\' 8"  (1.727 m)   Wt 210 lb 11.2 oz (95.6 kg)   SpO2 100%   BMI 32.04 kg/m    Physical Exam Vitals reviewed.  Constitutional:      General: She is not in acute distress.    Appearance: Normal appearance. She is not ill-appearing or toxic-appearing.  HENT:     Head: Normocephalic and atraumatic.     Right Ear: External ear normal.     Left Ear: External ear normal.     Nose: Nose normal.     Mouth/Throat:     Mouth: Mucous membranes are moist.     Pharynx: Oropharynx is clear. No oropharyngeal exudate or posterior oropharyngeal erythema.  Eyes:     General: No scleral icterus.    Extraocular Movements: Extraocular movements intact.     Conjunctiva/sclera: Conjunctivae normal.     Pupils: Pupils are equal, round, and reactive to light.  Cardiovascular:     Rate and Rhythm: Normal rate and regular rhythm.     Pulses: Normal pulses.     Heart sounds: Normal heart sounds. No murmur heard.   No friction rub. No gallop.  Pulmonary:     Effort: Pulmonary effort is normal. No respiratory distress.     Breath sounds: Normal breath sounds. No wheezing or rhonchi.  Chest:     Chest wall: No tenderness.  Abdominal:     General: Abdomen is flat. There is no distension.     Palpations: Abdomen is  soft.     Tenderness: There is no abdominal tenderness.  Musculoskeletal:        General: Normal range of motion.     Cervical back: Normal range of motion and neck supple.     Right lower leg: No edema.     Left lower leg: No edema.  Skin:    General: Skin is warm and dry.     Capillary Refill: Capillary refill takes less than 2 seconds.     Findings: No lesion or rash.  Neurological:     General: No focal deficit present.     Mental Status: She is alert and oriented to person, place, and time. Mental status is at baseline.  Psychiatric:        Mood and Affect: Mood normal.     Last depression screening scores PHQ 2/9 Scores 11/02/2020 06/19/2019 06/18/2018  PHQ - 2 Score 0 1 1  PHQ- 9 Score - - 1   Last fall risk screening Fall Risk  11/02/2020  Falls in the past year? 0  Number falls in past yr: 0  Injury with Fall? 0  Risk for fall due to : No Fall Risks  Follow up Falls evaluation completed   Last Audit-C alcohol use screening Alcohol Use Disorder Test (AUDIT) 11/02/2020  1. How often do you have a drink containing alcohol? 0  2. How many drinks containing alcohol do you have on a typical day when you are drinking? 0  3. How often do you have six or more drinks on one occasion? 0  AUDIT-C Score 0  Alcohol Brief Interventions/Follow-up -   A score of 3 or more in women, and 4 or more in men indicates increased risk for alcohol abuse, EXCEPT if all of the points are from question 1   No results found for any visits on 12/21/20.  Assessment & Plan  Routine Health Maintenance and Physical Exam  Exercise Activities and Dietary recommendations  Goals      DIET - EAT MORE FRUITS AND VEGETABLES     Recommend increasing fruits and vegetables in diet to at least two servings of each a day.      Exercise 3x per week (30 min per time)     Recommend to exercise for 3 days a week for at least 30 minutes at a time.      Increase water intake     Recommend increasing water  intake to 4-5 glasses a day.        Immunization History  Administered Date(s) Administered   Fluad Quad(high Dose 65+) 10/22/2018, 12/21/2020   Influenza, High Dose Seasonal PF 11/21/2013, 11/05/2016, 11/28/2017   PNEUMOCOCCAL CONJUGATE-20 12/21/2020   Pneumococcal Conjugate-13 07/05/2017   Pneumococcal-Unspecified 01/14/2013    Health Maintenance  Topic Date Due   COVID-19 Vaccine (1) Never done   Zoster Vaccines- Shingrix (1 of 2) Never done   TETANUS/TDAP  02/07/2026 (Originally 09/15/1954)   Pneumonia Vaccine 71+ Years old  Completed   INFLUENZA VACCINE  Completed   HPV VACCINES  Aged Out   DEXA SCAN  Discontinued    Discussed health benefits of physical activity, and encouraged her to engage in regular exercise appropriate for her age and condition.  Problem List Items Addressed This Visit       Cardiovascular and Mediastinum   Hypertension    New onset, uncontrolled Start hydrochlorothiazide 12.5mg        Relevant Medications   hydrochlorothiazide (HYDRODIURIL) 12.5 MG tablet     Endocrine   Acquired hypothyroidism    Previously well controlled Monitored by Dr. Kathyrn Sheriff Continue levothyroxine 112 mcg         Musculoskeletal and Integument   Arthritis    Osteoarthritis of right knee Start volataren gel        Other   Hyperlipidemia    Mixed hyperlipidemia Discussed lifestyle modifications Recheck lipid panel      Relevant Medications   hydrochlorothiazide (HYDRODIURIL) 12.5 MG tablet   Other Relevant Orders   Lipid panel   Vitamin D deficiency    Chronic Restart Vitamin D supplementation Recheck Vit D level      Relevant Orders   Vitamin D (25 hydroxy)   Other Visit Diagnoses     Annual physical exam    -  Primary   Need for pneumococcal vaccination       Relevant Orders   Pneumococcal conjugate vaccine 20-valent (Prevnar 20) (Completed)   Osteopenia, unspecified location       Need for influenza vaccination       Relevant Orders    Flu Vaccine QUAD High Dose(Fluad) (Completed)        Return in about 4 weeks (around 01/18/2021) for BP f/u, With new PCP.     Nelva Nay, Medical Student 12/21/2020, 3:07 PM   Patient seen along with MS3 student Nelva Nay. I personally evaluated this patient along with the student, and verified all aspects of the history, physical exam, and medical decision making as documented by the student. I agree with the student's documentation and have made all necessary edits.  Reyaansh Merlo, Dionne Bucy, MD, MPH [facility]

## 2020-12-21 NOTE — Assessment & Plan Note (Signed)
Chronic Restart Vitamin D supplementation Recheck Vit D level

## 2020-12-22 LAB — LIPID PANEL
Chol/HDL Ratio: 3.9 ratio (ref 0.0–4.4)
Cholesterol, Total: 205 mg/dL — ABNORMAL HIGH (ref 100–199)
HDL: 52 mg/dL (ref >39)
LDL Chol Calc (NIH): 130 mg/dL — ABNORMAL HIGH (ref 0–99)
Triglycerides: 130 mg/dL (ref 0–149)
VLDL Cholesterol Cal: 23 mg/dL (ref 5–40)

## 2020-12-22 LAB — VITAMIN D 25 HYDROXY (VIT D DEFICIENCY, FRACTURES): Vit D, 25-Hydroxy: 31.8 ng/mL (ref 30.0–100.0)

## 2021-01-12 ENCOUNTER — Other Ambulatory Visit: Payer: Self-pay | Admitting: General Surgery

## 2021-01-12 DIAGNOSIS — Z1231 Encounter for screening mammogram for malignant neoplasm of breast: Secondary | ICD-10-CM

## 2021-01-15 NOTE — Progress Notes (Signed)
Established patient visit   Patient: Eileen Flores   DOB: 12-Feb-1935   85 y.o. Female  MRN: [id number] Visit Date: 01/18/2021  Today's healthcare provider: [name], FNP   Chief Complaint  Patient presents with   Hypertension   Subjective    HPI  Hypertension, follow-up  BP Readings from Last 3 Encounters:  01/18/21 (!) 144/59  12/21/20 (!) 177/75  11/27/20 (!) 159/76   Wt Readings from Last 3 Encounters:  01/18/21 206 lb 12.8 oz (93.8 kg)  12/21/20 210 lb 11.2 oz (95.6 kg)  11/27/20 209 lb 7 oz (95 kg)     She was last seen for hypertension 4 weeks ago.  BP at that visit was 177/75. Management since that visit includes hydrochlorothiazide 12.5mg .  She reports good compliance with treatment. She is not having side effects.  She is following a Low Sodium diet. (Trying) She is not exercising. (Lots of house work) She does not smoke.  Use of agents associated with hypertension: none.   Outside blood pressures are none. Symptoms: No chest pain No chest pressure  No palpitations No syncope  No dyspnea No orthopnea  No paroxysmal nocturnal dyspnea No lower extremity edema   Pertinent labs: Lab Results  Component Value Date   CHOL 205 (H) 12/21/2020   HDL 52 12/21/2020   LDLCALC 130 (H) 12/21/2020   TRIG 130 12/21/2020   CHOLHDL 3.9 12/21/2020   Lab Results  Component Value Date   NA 138 11/27/2020   K 3.9 11/27/2020   CREATININE 0.91 11/27/2020   GFRNONAA >60 11/27/2020   GLUCOSE 116 (H) 11/27/2020   TSH 1.210 10/22/2018     The ASCVD Risk score (Arnett DK, et al., 2019) failed to calculate for the following reasons:   The 2019 ASCVD risk score is only valid for ages 8 to 33   ---------------------------------------------------------------------------------------------------   Medications: Outpatient Medications Prior to Visit  Medication Sig   Bioflavonoid Products (VITAMIN C) CHEW Chew by mouth.   Calcium Carb-Cholecalciferol 600-10  MG-MCG CHEW Chew by mouth.   hydrochlorothiazide (HYDRODIURIL) 12.5 MG tablet Take 1 tablet (12.5 mg total) by mouth daily.   levothyroxine (SYNTHROID) 112 MCG tablet Take 1 tablet (112 mcg total) by mouth daily before breakfast.   Masks (SURGICAL FACE MASK/NIOSH N95) MISC Surgical mask for daily use while out in puclic   timolol (TIMOPTIC) 0.5 % ophthalmic solution Place 1 drop into both eyes daily.   No facility-administered medications prior to visit.    Review of Systems    Objective    BP (!) 144/59 (BP Location: Right Arm, Patient Position: Sitting, Cuff Size: Large)   Pulse 64   Temp 97.8 F (36.6 C) (Oral)   Ht 5\' 8"  (1.727 m)   Wt 206 lb 12.8 oz (93.8 kg)   SpO2 100%   BMI 31.44 kg/m  BP Readings from Last 3 Encounters:  01/18/21 (!) 144/59  12/21/20 (!) 177/75  11/27/20 (!) 159/76   Wt Readings from Last 3 Encounters:  01/18/21 206 lb 12.8 oz (93.8 kg)  12/21/20 210 lb 11.2 oz (95.6 kg)  11/27/20 209 lb 7 oz (95 kg)      Physical Exam Vitals and nursing note reviewed.  Constitutional:      General: She is not in acute distress.    Appearance: Normal appearance. She is obese. She is not ill-appearing, toxic-appearing or diaphoretic.  HENT:     Head: Normocephalic and atraumatic.  Cardiovascular:  Rate and Rhythm: Normal rate and regular rhythm.     Pulses: Normal pulses.     Heart sounds: Normal heart sounds. No murmur heard.   No friction rub. No gallop.  Pulmonary:     Effort: Pulmonary effort is normal. No respiratory distress.     Breath sounds: Normal breath sounds. No stridor. No wheezing, rhonchi or rales.  Chest:     Chest wall: No tenderness.  Abdominal:     General: Bowel sounds are normal.     Palpations: Abdomen is soft.  Musculoskeletal:        General: No swelling, tenderness, deformity or signs of injury. Normal range of motion.     Right lower leg: No edema.     Left lower leg: No edema.  Skin:    General: Skin is warm and dry.      Capillary Refill: Capillary refill takes less than 2 seconds.     Coloration: Skin is not jaundiced or pale.     Findings: No bruising, erythema, lesion or rash.  Neurological:     General: No focal deficit present.     Mental Status: She is alert and oriented to person, place, and time. Mental status is at baseline.     Cranial Nerves: No cranial nerve deficit.     Sensory: No sensory deficit.     Motor: No weakness.     Coordination: Coordination normal.  Psychiatric:        Mood and Affect: Mood normal.        Behavior: Behavior normal.        Thought Content: Thought content normal.        Judgment: Judgment normal.      No results found for any visits on 01/18/21.  Assessment & Plan     Problem List Items Addressed This Visit       Cardiovascular and Mediastinum   Primary hypertension - Primary    Improved since starting HCTZ 12.5 Denies difficulties taking medication Denies dizziness  Endorses frequent urinary- chronic concern        Other   Mixed stress and urge urinary incontinence    Improved since UTI Continue to drink cranberry juice Recent episode of stress incontinence with sneezing Worse at night No large change since starting HCTZ- does not wish to see urology at this time      Risk for falls    Last fall 3 years prior Use of cane/walker Lives with family Hx of blood pressure/on diuretic- frequent urinary      Need for shingles vaccine    Paper Rx provided      Relevant Medications   Zoster Vaccine Adjuvanted [facility]) injection     Return in about 5 months (around 06/18/2021) for chonic disease management, HTN management.      Vonna Kotyk, FNP, have reviewed all documentation for this visit. The documentation on 01/18/21 for the exam, diagnosis, procedures, and orders are all accurate and complete.    [name], [place] [phone] (phone) [phone] (fax)  [place]

## 2021-01-18 ENCOUNTER — Encounter: Payer: Self-pay | Admitting: Family Medicine

## 2021-01-18 ENCOUNTER — Ambulatory Visit (INDEPENDENT_AMBULATORY_CARE_PROVIDER_SITE_OTHER): Payer: Medicare HMO | Admitting: Family Medicine

## 2021-01-18 ENCOUNTER — Other Ambulatory Visit: Payer: Self-pay

## 2021-01-18 VITALS — BP 144/59 | HR 64 | Temp 97.8°F | Ht 68.0 in | Wt 206.8 lb

## 2021-01-18 DIAGNOSIS — Z23 Encounter for immunization: Secondary | ICD-10-CM | POA: Diagnosis not present

## 2021-01-18 DIAGNOSIS — Z9181 History of falling: Secondary | ICD-10-CM | POA: Insufficient documentation

## 2021-01-18 DIAGNOSIS — I10 Essential (primary) hypertension: Secondary | ICD-10-CM | POA: Insufficient documentation

## 2021-01-18 DIAGNOSIS — N3946 Mixed incontinence: Secondary | ICD-10-CM | POA: Insufficient documentation

## 2021-01-18 MED ORDER — ZOSTER VAC RECOMB ADJUVANTED 50 MCG/0.5ML IM SUSR: 0.5000 mL | Freq: Once | INTRAMUSCULAR | 0 refills | Status: AC

## 2021-01-18 NOTE — Assessment & Plan Note (Signed)
Paper Rx provided

## 2021-01-18 NOTE — Assessment & Plan Note (Signed)
Improved since starting HCTZ 12.5 Denies difficulties taking medication Denies dizziness  Endorses frequent urinary- chronic concern

## 2021-01-18 NOTE — Assessment & Plan Note (Signed)
Improved since UTI Continue to drink cranberry juice Recent episode of stress incontinence with sneezing Worse at night No large change since starting HCTZ- does not wish to see urology at this time

## 2021-01-18 NOTE — Assessment & Plan Note (Signed)
Last fall 3 years prior Use of cane/walker Lives with family Hx of blood pressure/on diuretic- frequent urinary

## 2021-01-25 DIAGNOSIS — H401131 Primary open-angle glaucoma, bilateral, mild stage: Secondary | ICD-10-CM | POA: Diagnosis not present

## 2021-02-09 ENCOUNTER — Other Ambulatory Visit: Payer: Self-pay

## 2021-02-09 ENCOUNTER — Ambulatory Visit
Admission: RE | Admit: 2021-02-09 | Discharge: 2021-02-09 | Disposition: A | Discharge status: Home / Self Care | Payer: Medicare HMO | Source: Ambulatory Visit | Attending: General Surgery | Admitting: General Surgery

## 2021-02-09 DIAGNOSIS — Z1231 Encounter for screening mammogram for malignant neoplasm of breast: Secondary | ICD-10-CM | POA: Diagnosis not present

## 2021-02-16 DIAGNOSIS — D0512 Intraductal carcinoma in situ of left breast: Secondary | ICD-10-CM | POA: Diagnosis not present

## 2021-03-18 ENCOUNTER — Encounter (HOSPITAL_COMMUNITY): Payer: Self-pay | Admitting: Radiology

## 2021-04-06 ENCOUNTER — Other Ambulatory Visit: Payer: Self-pay

## 2021-04-06 ENCOUNTER — Encounter: Payer: Self-pay | Admitting: Family Medicine

## 2021-04-06 ENCOUNTER — Ambulatory Visit (INDEPENDENT_AMBULATORY_CARE_PROVIDER_SITE_OTHER): Payer: Medicare HMO | Admitting: Family Medicine

## 2021-04-06 VITALS — BP 172/70 | HR 71 | Temp 98.1°F | Resp 16 | Wt 201.9 lb

## 2021-04-06 DIAGNOSIS — N2889 Other specified disorders of kidney and ureter: Secondary | ICD-10-CM | POA: Diagnosis not present

## 2021-04-06 DIAGNOSIS — M545 Low back pain, unspecified: Secondary | ICD-10-CM

## 2021-04-06 DIAGNOSIS — R918 Other nonspecific abnormal finding of lung field: Secondary | ICD-10-CM

## 2021-04-06 DIAGNOSIS — N3946 Mixed incontinence: Secondary | ICD-10-CM

## 2021-04-06 DIAGNOSIS — G8929 Other chronic pain: Secondary | ICD-10-CM

## 2021-04-06 DIAGNOSIS — Z9989 Dependence on other enabling machines and devices: Secondary | ICD-10-CM | POA: Diagnosis not present

## 2021-04-06 DIAGNOSIS — I10 Essential (primary) hypertension: Secondary | ICD-10-CM | POA: Diagnosis not present

## 2021-04-06 DIAGNOSIS — R350 Frequency of micturition: Secondary | ICD-10-CM | POA: Insufficient documentation

## 2021-04-06 DIAGNOSIS — R921 Mammographic calcification found on diagnostic imaging of breast: Secondary | ICD-10-CM

## 2021-04-06 LAB — POCT URINALYSIS DIPSTICK
Bilirubin, UA: NEGATIVE
Glucose, UA: NEGATIVE
Ketones, UA: NEGATIVE
Nitrite, UA: NEGATIVE
Protein, UA: NEGATIVE
Spec Grav, UA: 1.015 (ref 1.010–1.025)
Urobilinogen, UA: 0.2 E.U./dL
pH, UA: 6 (ref 5.0–8.0)

## 2021-04-06 MED ORDER — LISINOPRIL 20 MG PO TABS: 20.0000 mg | ORAL_TABLET | Freq: Every day | ORAL | 1 refills | Status: DC

## 2021-04-06 NOTE — Assessment & Plan Note (Signed)
Seen on imaging in 10/22; recommend f/u with [place] to correlate- pt wishes for [place] was previously seen for mammo and at specialist and this was not addressed

## 2021-04-06 NOTE — Assessment & Plan Note (Signed)
Chronic use; no recent falls

## 2021-04-06 NOTE — Assessment & Plan Note (Signed)
Negative CVA tenderness; will refer to ortho for imaging Imaging back in 2017 showed narrowing with slight bulging disc; anticipate that this has worsened Encourage walking and stretching as tolerated

## 2021-04-06 NOTE — Assessment & Plan Note (Signed)
Seen on imaging in 10/22 Due for low dose CT follow up No active lung complaints at this time

## 2021-04-06 NOTE — Assessment & Plan Note (Signed)
Increase in urinary frequency in addition to chronic urinary symptoms UA completed in office Will sent for UCx Pt reported recent use of previously prescribed Abx; advised that all medications like Abx are designed to be taken in full and will hold treatment at this time until culture returns Pt has been taking cranberry juice and OTC AZO as well

## 2021-04-06 NOTE — Assessment & Plan Note (Signed)
Acute on chronic complaints Continue to drink cranberry juice Recent episode of stress incontinence with sneezing Worse at night Continue to decline referral to urology

## 2021-04-06 NOTE — Assessment & Plan Note (Signed)
Pt had echogenic matter seen on imaging in 11/2020; due for follow up Chronic complaints of urinary frequency Imaging ordered for f/u

## 2021-04-06 NOTE — Assessment & Plan Note (Signed)
Patient stopped taking HCTZ 12.5 due to "cold intolerance" BP elevated today; reinforced need for medication to assist with blood pressure control Goal <140/<90 LE ankle non-pitting edema present at times per pt report since stopping medication  Denies dizziness  Endorses frequent urinary- chronic concern Will start ACEi Lisinopril today; Return in 4-6 weeks Encourage home BP checks Reinforce DASH diet

## 2021-04-06 NOTE — Progress Notes (Signed)
Established patient visit   Patient: Eileen Flores   DOB: 03/04/1935   86 y.o. Female  MRN: [id number] Visit Date: 04/06/2021  Today's healthcare provider: [name], FNP   I,Joseline E Rosas,acting as a scribe for [name], FNP.,have documented all relevant documentation on the behalf of [name], FNP,as directed by  [name], FNP while in the presence of [name], FNP.   Chief Complaint  Patient presents with   Follow-up   Subjective    HPI  Patient here to go over her imaging results that she had done in the ED 11/27/2020.  Patient also stopped her BP medicine since December. Reports that she was having "low temperatures." Feeling cold. Reports that the cold intolerance that she was having is better.  Medications: Outpatient Medications Prior to Visit  Medication Sig   Bioflavonoid Products (VITAMIN C) CHEW Chew by mouth.   Calcium Carb-Cholecalciferol 600-10 MG-MCG CHEW Chew by mouth.   levothyroxine (SYNTHROID) 112 MCG tablet Take 1 tablet (112 mcg total) by mouth daily before breakfast.   Masks (SURGICAL FACE MASK/NIOSH N95) MISC Surgical mask for daily use while out in puclic   timolol (TIMOPTIC) 0.5 % ophthalmic solution Place 1 drop into both eyes daily.   hydrochlorothiazide (HYDRODIURIL) 12.5 MG tablet Take 1 tablet (12.5 mg total) by mouth daily. (Patient not taking: Reported on 04/06/2021)   No facility-administered medications prior to visit.    Review of Systems     Objective    BP (!) 172/70 (BP Location: Right Arm, Patient Position: Sitting, Cuff Size: Large)    Pulse 71    Temp 98.1 F (36.7 C) (Oral)    Resp 16    Wt 201 lb 14.4 oz (91.6 kg)    BMI 30.70 kg/m    Physical Exam Vitals and nursing note reviewed.  Constitutional:      General: She is not in acute distress.    Appearance: Normal appearance. She is obese. She is not ill-appearing, toxic-appearing or diaphoretic.  HENT:     Head: Normocephalic and atraumatic.   Cardiovascular:     Rate and Rhythm: Normal rate and regular rhythm.     Pulses: Normal pulses.     Heart sounds: Normal heart sounds. No murmur heard.   No friction rub. No gallop.  Pulmonary:     Effort: Pulmonary effort is normal. No respiratory distress.     Breath sounds: Normal breath sounds. No stridor. No wheezing, rhonchi or rales.  Chest:     Chest wall: No tenderness.  Abdominal:     General: Bowel sounds are normal.     Palpations: Abdomen is soft.  Musculoskeletal:        General: No swelling, tenderness, deformity or signs of injury. Normal range of motion.     Right lower leg: No edema.     Left lower leg: No edema.  Skin:    General: Skin is warm and dry.     Capillary Refill: Capillary refill takes less than 2 seconds.     Coloration: Skin is not jaundiced or pale.     Findings: No bruising, erythema, lesion or rash.  Neurological:     General: No focal deficit present.     Mental Status: She is alert and oriented to person, place, and time. Mental status is at baseline.     Cranial Nerves: No cranial nerve deficit.     Sensory: No sensory deficit.  Motor: No weakness.     Coordination: Coordination normal.  Psychiatric:        Mood and Affect: Mood normal.        Behavior: Behavior normal.        Thought Content: Thought content normal.        Judgment: Judgment normal.     Results for orders placed or performed in visit on 04/06/21  POCT urinalysis dipstick  Result Value Ref Range   Color, UA straw    Clarity, UA cloudy    Glucose, UA Negative Negative   Bilirubin, UA Negative    Ketones, UA Negative    Spec Grav, UA 1.015 1.010 - 1.025   Blood, UA trace    pH, UA 6.0 5.0 - 8.0   Protein, UA Negative Negative   Urobilinogen, UA 0.2 0.2 or 1.0 E.U./dL   Nitrite, UA Negative    Leukocytes, UA Moderate (2+) (A) Negative   Appearance     Odor      Assessment & Plan     Problem List Items Addressed This Visit       Cardiovascular and  Mediastinum   Primary hypertension    Patient stopped taking HCTZ 12.5 due to "cold intolerance" BP elevated today; reinforced need for medication to assist with blood pressure control Goal <140/<90 LE ankle non-pitting edema present at times per pt report since stopping medication  Denies dizziness  Endorses frequent urinary- chronic concern Will start ACEi Lisinopril today; Return in 4-6 weeks Encourage home BP checks Reinforce DASH diet      Relevant Medications   lisinopril (ZESTRIL) 20 MG tablet     Genitourinary   Other specified disorders of kidney and ureter    Pt had echogenic matter seen on imaging in 11/2020; due for follow up Chronic complaints of urinary frequency Imaging ordered for f/u      Relevant Orders   MR Abdomen W Wo Contrast     Other   Breast calcification, left    Seen on imaging in 10/22; recommend f/u with [place] to correlate- pt wishes for [place] was previously seen for mammo and at specialist and this was not addressed      Relevant Orders   US BREAST LTD UNI LEFT INC AXILLA   Chronic bilateral low back pain without sciatica - Primary    Negative CVA tenderness; will refer to ortho for imaging Imaging back in 2017 showed narrowing with slight bulging disc; anticipate that this has worsened Encourage walking and stretching as tolerated      Relevant Orders   Urine Culture   POCT urinalysis dipstick (Completed)   Ambulatory referral to Orthopedic Surgery   Lung nodule, multiple    Seen on imaging in 10/22 Due for low dose CT follow up No active lung complaints at this time      Relevant Orders   CT CHEST LUNG CA SCREEN LOW DOSE W/O CM   CT Chest Wo Contrast   Mixed stress and urge urinary incontinence    Acute on chronic complaints Continue to drink cranberry juice Recent episode of stress incontinence with sneezing Worse at night Continue to decline referral to urology      Relevant Orders   Urine Culture   POCT urinalysis dipstick  (Completed)   Urinary frequency    Increase in urinary frequency in addition to chronic urinary symptoms UA completed in office Will sent for UCx Pt reported recent use of previously prescribed Abx; advised that all medications  like Abx are designed to be taken in full and will hold treatment at this time until culture returns Pt has been taking cranberry juice and OTC AZO as well      Relevant Orders   Urine Culture   POCT urinalysis dipstick (Completed)   Use of cane as ambulatory aid    Chronic use; no recent falls        Return in about 6 weeks (around 05/18/2021) for HTN management.      Vonna Kotyk, FNP, have reviewed all documentation for this visit. The documentation on 04/06/21 for the exam, diagnosis, procedures, and orders are all accurate and complete.    [name], [place] [phone] (phone) [phone] (fax)  [place]

## 2021-04-08 LAB — URINE CULTURE

## 2021-04-19 ENCOUNTER — Ambulatory Visit: Payer: Self-pay | Admitting: *Deleted

## 2021-04-19 NOTE — Telephone Encounter (Signed)
?  Chief Complaint: Diarrhea for 3 days not improving with Imodium yesterday or today ?Symptoms: Abd pain, watery yellow colored diarrhea ?Frequency: For 3 days ?Pertinent Negatives: Patient denies Dizziness but is weak and everything she drinks goes through her. ?Disposition: '[]'$ ED /'[]'$ Urgent Care (no appt availability in office) / '[x]'$ Appointment(In office/virtual)/ '[]'$  [place] [facility]/ '[]'$ Home Care/ '[]'$ Refused Recommended Disposition /'[]'$ [place] [facility]/ '[]'$  Follow-up with PCP ?Additional Notes: Made an appt for 04/20/2021 at 1:00 with [name], PA with directions to go to the ED if she becomes dizzy, unable to walk, blood in stool.    ? ?The phone line disconnected so I called her back and confirmed her appt and went over the s/s to go to the ED for.   She verbalized understanding and thanked me for calling her back.   ?

## 2021-04-19 NOTE — Progress Notes (Incomplete)
Established patient visit   Patient: Eileen Flores   DOB: 1935-03-12   86 y.o. Female  MRN: [id number] Visit Date: 04/20/2021  Today's healthcare provider: [name], PA-C   No chief complaint on file.  Subjective    HPI  ***  DIARRHEA  Having diarrhea for *** days Progression: *** Does diarrhea wake patient: *** Medications tried: *** Patient believes might be causing their pain: ***  Recent travel: *** Sick contacts: *** Ingested suspicious foods: *** Antibiotics recently: *** Immunocompromised: ***  Symptoms Vomiting: *** Abdominal pain: *** Weight Loss: *** Decreased urine output: *** Lightheadedness: *** Fever: *** Bloody stools: ***  ROS see HPI Smoking Status noted GASTROENTERITIS Duration: Diarrhea: {Blank single:19197::"yes","no"} {Blank single:19197::"bloody","non-bloody"}  Episodes of diarrhea/day:  Nausea: {Blank single:19197::"yes","no"} Vomiting: {Blank single:19197::"yes","no"} Episodes of vomit/day:  Abdominal pain: {Blank single:19197::"yes","no"} Fever: {Blank single:19197::"yes","no"} Decreased appetite: {Blank single:19197::"yes","no"} Tolerating liquids: {Blank single:19197::"yes","no"} Foreign travel: {Blank single:19197::"yes","no"} Relevant dietary history:  Similar illness in contacts: {Blank single:19197::"yes","no"} Recent antibiotic use: {Blank single:19197::"yes","no"} Status: {Blank multiple:19196::"better", "worse", "stable", "fluctuating"} Treatments attempted  Chief Complaint: Diarrhea for 3 days not improving with Imodium yesterday or today Symptoms: Abd pain, watery yellow colored diarrhea Frequency: For 3 days Pertinent Negatives: Patient denies Dizziness but is weak and everything she drinks goes through her. 1. DIARRHEA SEVERITY: "How bad is the diarrhea?" "How many more stools have you had in the past 24 hours than normal?"    - NO DIARRHEA (SCALE 0)   - MILD (SCALE 1-3): Few loose or mushy BMs; increase  of 1-3 stools over normal daily number of stools; mild increase in ostomy output.   -  MODERATE (SCALE 4-7): Increase of 4-6 stools daily over normal; moderate increase in ostomy output. * SEVERE (SCALE 8-10; OR 'WORST POSSIBLE'): Increase of 7 or more stools daily over normal; moderate increase in ostomy output; incontinence.     Diarrhea for 3 days.    I hoped it would run it's course.   I've taken Imodium yesterday and today.   It's not helped much.    I'm so [place].   I'm eating.   2. ONSET: "When did the diarrhea begin?"      3 days ago. 3. BM CONSISTENCY: "How loose or watery is the diarrhea?"      Mostly liquid  and yellow colored.   4. VOMITING: "Are you also vomiting?" If Yes, ask: "How many times in the past 24 hours?"      No  Feel nauseas 5. ABDOMINAL PAIN: "Are you having any abdominal pain?" If Yes, ask: "What does it feel like?" (e.g., crampy, dull, intermittent, constant)      Yes 6. ABDOMINAL PAIN SEVERITY: If present, ask: "How bad is the pain?"  (e.g., Scale 1-10; mild, moderate, or severe)   - MILD (1-3): doesn't interfere with normal activities, abdomen soft and not tender to touch    - MODERATE (4-7): interferes with normal activities or awakens from sleep, abdomen tender to touch    - SEVERE (8-10): excruciating pain, doubled over, unable to do any normal activities       *No Answer* 7. ORAL INTAKE: If vomiting, "Have you been able to drink liquids?" "How much liquids have you had in the past 24 hours?"     *No Answer* 8. HYDRATION: "Any signs of dehydration?" (e.g., dry mouth [not just dry lips], too weak to stand, dizziness, new weight loss) "When did you last urinate?"     *No Answer* 9. EXPOSURE: "  Have you traveled to a foreign country recently?" "Have you been exposed to anyone with diarrhea?" "Could you have eaten any food that was spoiled?"     *No Answer* 10. ANTIBIOTIC USE: "Are you taking antibiotics now or have you taken antibiotics in the past 2 months?"        *No Answer* 11. OTHER SYMPTOMS: "Do you have any other symptoms?" (e.g., fever, blood in stool)       *No Answer* 12. PREGNANCY: "Is there any chance you are pregnant?" "When was your last menstrual period?"       *No Answer*  Medications: Outpatient Medications Prior to Visit  Medication Sig   Bioflavonoid Products (VITAMIN C) CHEW Chew by mouth.   Calcium Carb-Cholecalciferol 600-10 MG-MCG CHEW Chew by mouth.   hydrochlorothiazide (HYDRODIURIL) 12.5 MG tablet Take 1 tablet (12.5 mg total) by mouth daily. (Patient not taking: Reported on 04/06/2021)   levothyroxine (SYNTHROID) 112 MCG tablet Take 1 tablet (112 mcg total) by mouth daily before breakfast.   lisinopril (ZESTRIL) 20 MG tablet Take 1 tablet (20 mg total) by mouth daily.   Masks (SURGICAL FACE MASK/NIOSH N95) MISC Surgical mask for daily use while out in puclic   timolol (TIMOPTIC) 0.5 % ophthalmic solution Place 1 drop into both eyes daily.   No facility-administered medications prior to visit.    Review of Systems  {Labs   Heme   Chem   Endocrine   Serology   Results Review (optional):23779}   Objective    There were no vitals taken for this visit. {Show previous vital signs (optional):23777}  Physical Exam  ***  No results found for any visits on 04/20/21.  Assessment & Plan     ***  No follow-ups on file.     The patient was advised to call back or seek an in-person evaluation if the symptoms worsen or if the condition fails to improve as anticipated.  I discussed the assessment and treatment plan with the patient. The patient was provided an opportunity to ask questions and all were answered. The patient agreed with the plan and demonstrated an understanding of the instructions.  The entirety of the information documented in the History of Present Illness, Review of Systems and Physical Exam were personally obtained by me. Portions of this information were initially documented by the CMA and  reviewed by me for thoroughness and accuracy.     [name], PA-C  [facility] [phone] (phone) [phone] (fax)  [place]

## 2021-04-19 NOTE — Telephone Encounter (Signed)
Reason for Disposition ? [1] SEVERE diarrhea (e.g., 7 or more times / day more than normal) AND [2] age > 60 years ? ?Answer Assessment - Initial Assessment Questions ?1. DIARRHEA SEVERITY: "How bad is the diarrhea?" "How many more stools have you had in the past 24 hours than normal?"  ?  - NO DIARRHEA (SCALE 0) ?  - MILD (SCALE 1-3): Few loose or mushy BMs; increase of 1-3 stools over normal daily number of stools; mild increase in ostomy output. ?  -  MODERATE (SCALE 4-7): Increase of 4-6 stools daily over normal; moderate increase in ostomy output. ?* SEVERE (SCALE 8-10; OR 'WORST POSSIBLE'): Increase of 7 or more stools daily over normal; moderate increase in ostomy output; incontinence. ?    Diarrhea for 3 days.    I hoped it would run it's course.   I've taken Imodium yesterday and today.   It's not helped much.    I'm so [place].   I'm eating.   ?2. ONSET: "When did the diarrhea begin?"  ?    3 days ago. ?3. BM CONSISTENCY: "How loose or watery is the diarrhea?"  ?    Mostly liquid  and yellow colored.   ?4. VOMITING: "Are you also vomiting?" If Yes, ask: "How many times in the past 24 hours?"  ?    No  Feel nauseas ?5. ABDOMINAL PAIN: "Are you having any abdominal pain?" If Yes, ask: "What does it feel like?" (e.g., crampy, dull, intermittent, constant)  ?    Yes ?6. ABDOMINAL PAIN SEVERITY: If present, ask: "How bad is the pain?"  (e.g., Scale 1-10; mild, moderate, or severe) ?  - MILD (1-3): doesn't interfere with normal activities, abdomen soft and not tender to touch  ?  - MODERATE (4-7): interferes with normal activities or awakens from sleep, abdomen tender to touch  ?  - SEVERE (8-10): excruciating pain, doubled over, unable to do any normal activities   ?    *No Answer* ?7. ORAL INTAKE: If vomiting, "Have you been able to drink liquids?" "How much liquids have you had in the past 24 hours?" ?    *No Answer* ?8. HYDRATION: "Any signs of dehydration?" (e.g., dry mouth [not just dry lips], too weak to  stand, dizziness, new weight loss) "When did you last urinate?" ?    *No Answer* ?9. EXPOSURE: "Have you traveled to a foreign country recently?" "Have you been exposed to anyone with diarrhea?" "Could you have eaten any food that was spoiled?" ?    *No Answer* ?10. ANTIBIOTIC USE: "Are you taking antibiotics now or have you taken antibiotics in the past 2 months?" ?      *No Answer* ?11. OTHER SYMPTOMS: "Do you have any other symptoms?" (e.g., fever, blood in stool) ?      *No Answer* ?12. PREGNANCY: "Is there any chance you are pregnant?" "When was your last menstrual period?" ?      *No Answer* ? ?Protocols used: Diarrhea-A-AH ? ?

## 2021-04-20 ENCOUNTER — Encounter: Payer: Self-pay | Admitting: Physician Assistant

## 2021-04-20 ENCOUNTER — Ambulatory Visit: Payer: Self-pay

## 2021-04-20 ENCOUNTER — Other Ambulatory Visit: Payer: Self-pay

## 2021-04-20 ENCOUNTER — Ambulatory Visit (INDEPENDENT_AMBULATORY_CARE_PROVIDER_SITE_OTHER): Payer: Medicare HMO | Admitting: Physician Assistant

## 2021-04-20 VITALS — BP 150/65 | HR 64 | Resp 15 | Wt 202.4 lb

## 2021-04-20 DIAGNOSIS — R197 Diarrhea, unspecified: Secondary | ICD-10-CM | POA: Diagnosis not present

## 2021-04-20 DIAGNOSIS — R82998 Other abnormal findings in urine: Secondary | ICD-10-CM | POA: Diagnosis not present

## 2021-04-20 LAB — POCT URINALYSIS DIPSTICK
Bilirubin, UA: NEGATIVE
Glucose, UA: NEGATIVE
Ketones, UA: NEGATIVE
Nitrite, UA: NEGATIVE
Protein, UA: NEGATIVE
Spec Grav, UA: 1.025 (ref 1.010–1.025)
Urobilinogen, UA: 0.2 E.U./dL
pH, UA: 5 (ref 5.0–8.0)

## 2021-04-20 NOTE — Telephone Encounter (Signed)
?  Chief Complaint: Constipation ?Symptoms: last BM Sunday ?Frequency: ongoing ?Pertinent Negatives: Patient denies Fever ?Disposition: '[]'$ ED /'[]'$ Urgent Care (no appt availability in office) / '[]'$ Appointment(In office/virtual)/ '[]'$  [facility] [facility]/ '[]'$ Home Care/ '[]'$ Refused Recommended Disposition /'[]'$ [facility] [facility]/ '[x]'$  Follow-up with PCP ?Additional Notes: Pt was NT yesterday for diarrhea. Pt had diarrhea for several days and took immodium. Now pt cannot have a BM. Pt states that this pattern has been ongoing for awhile and needs a solution.  PT has virtual visit scheduled for today at 1pm. ? ?Answer Assessment - Initial Assessment Questions ?1. STOOL PATTERN OR FREQUENCY: "How often do you have a bowel movement (BM)?"  (Normal range: 3 times a day to every 3 days)  "When was your last BM?"   ?    Sunday ?2. STRAINING: "Do you have to strain to have a BM?"  ?    always ?3. RECTAL PAIN: "Does your rectum hurt when the stool comes out?" If Yes, ask: "Do you have hemorrhoids? How bad is the pain?"  (Scale 1-10; or mild, moderate, severe) ?    no ?4. STOOL COMPOSITION: "Are the stools hard?"  ? ?5. BLOOD ON STOOLS: "Has there been any blood on the toilet tissue or on the surface of the BM?" If Yes, ask: "When was the last time?"  ?    no ?6. CHRONIC CONSTIPATION: "Is this a new problem for you?"  If no, ask: "How long have you had this problem?" (days, weeks, months)  ?        A long time ?7. CHANGES IN DIET OR HYDRATION: "Have there been any recent changes in your diet?" "How much fluids are you drinking on a daily basis?"  "How much have you had to drink today?" ?     ?8. MEDICATIONS: "Have you been taking any new medications?" "Are you taking any narcotic pain medications?" (e.g., Vicodin, Percocet, morphine, Dilaudid) ?     ?9. LAXATIVES: "Have you been using any stool softeners, laxatives, or enemas?"  If yes, ask "What, how often, and when was the last time?" ?    Sunday - took immodium for diarrhea ?10.  ACTIVITY:  "How much walking do you do every day?"  "Has your activity level decreased in the past week?"  ?       ?11. CAUSE: "What do you think is causing the constipation?"  ?      Unknown ?12. OTHER SYMPTOMS: "Do you have any other symptoms?" (e.g., abdominal pain, bloating, fever, vomiting) ?       ?13. MEDICAL HISTORY: "Do you have a history of hemorrhoids, rectal fissures, or rectal surgery or rectal abscess?"   ?       ?14. PREGNANCY: "Is there any chance you are pregnant?" "When was your last menstrual period?" ? ?Protocols used: Constipation-A-AH ? ?

## 2021-04-20 NOTE — Progress Notes (Deleted)
?  ? ? ?  Established patient visit ? ? ?Patient: Eileen Flores   DOB: Dec 26, 1935   86 y.o. Female  MRN: [id number] ?Visit Date: 04/20/2021 ? ?Today's healthcare provider: [name], PA-C  ? ?Chief Complaint  ?Patient presents with  ? Diarrhea  ? ?Subjective  ?  ?Diarrhea  ?This is a new problem. The current episode started in the past 7 days. The problem occurs 2 to 4 times per day. The problem has been resolved. The stool consistency is described as Watery. The patient states that diarrhea does not awaken her from sleep. Associated symptoms include abdominal pain and increased flatus. Pertinent negatives include no arthralgias, bloating, chills, coughing, fever, headaches, myalgias, sweats, URI, vomiting or weight loss. She has tried bismuth subsalicylate and increased fluids for the symptoms.   ?*** ? ?Medications: ?Outpatient Medications Prior to Visit  ?Medication Sig  ? Bioflavonoid Products (VITAMIN C) CHEW Chew by mouth.  ? Calcium Carb-Cholecalciferol 600-10 MG-MCG CHEW Chew by mouth.  ? levothyroxine (SYNTHROID) 112 MCG tablet Take 1 tablet (112 mcg total) by mouth daily before breakfast.  ? lisinopril (ZESTRIL) 20 MG tablet Take 1 tablet (20 mg total) by mouth daily.  ? Masks (SURGICAL FACE MASK/NIOSH N95) MISC Surgical mask for daily use while out in puclic  ? timolol (TIMOPTIC) 0.5 % ophthalmic solution Place 1 drop into both eyes daily.  ? [DISCONTINUED] hydrochlorothiazide (HYDRODIURIL) 12.5 MG tablet Take 1 tablet (12.5 mg total) by mouth daily. (Patient not taking: Reported on 04/06/2021)  ? ?No facility-administered medications prior to visit.  ? ? ?Review of Systems  ?Constitutional:  Negative for chills, fever and weight loss.  ?Respiratory:  Negative for cough.   ?Gastrointestinal:  Positive for abdominal pain, constipation, diarrhea and flatus. Negative for bloating and vomiting.  ?Musculoskeletal:  Negative for arthralgias and myalgias.  ?Neurological:  Negative for headaches.  ? ?{Labs  Heme   Chem  Endocrine  Serology  Results Review (optional):23779} ?  Objective  ?  ?BP (!) 150/65   Pulse 64   Resp 15   Wt 202 lb 6.4 oz (91.8 kg)   SpO2 99%   BMI 30.77 kg/m?  ?{Show previous vital signs (optional):23777} ? ?Physical Exam  ?*** ? ?Results for orders placed or performed in visit on 04/20/21  ?POCT urinalysis dipstick  ?Result Value Ref Range  ? Color, UA yellow   ? Clarity, UA clear   ? Glucose, UA Negative Negative  ? Bilirubin, UA negative   ? Ketones, UA negative   ? Spec Grav, UA 1.025 1.010 - 1.025  ? Blood, UA small   ? pH, UA 5.0 5.0 - 8.0  ? Protein, UA Negative Negative  ? Urobilinogen, UA 0.2 0.2 or 1.0 E.U./dL  ? Nitrite, UA negative   ? Leukocytes, UA Moderate (2+) (A) Negative  ? Appearance    ? Odor    ? ? Assessment & Plan  ?  ? ?*** ? ?No follow-ups on file.  ?   ? ?{provider attestation***:1} ? ?[employer] as a Education administrator for [employer], PA-C.,have documented all relevant documentation on the behalf of [name], PA-C,as directed by  [employer], PA-C while in the presence of [employer], PA-C.  ? ?[name], PA-C  ?[place] ?[phone] (phone) ?[phone] (fax) ? ?[facility] ?

## 2021-04-20 NOTE — Telephone Encounter (Signed)
Patient is in office. KW ?

## 2021-04-21 LAB — CBC WITH DIFFERENTIAL/PLATELET
Basophils Absolute: 0.1 10*3/uL (ref 0.0–0.2)
Basos: 1 %
EOS (ABSOLUTE): 0.1 10*3/uL (ref 0.0–0.4)
Eos: 1 %
Hematocrit: 42.9 % (ref 34.0–46.6)
Hemoglobin: 14.3 g/dL (ref 11.1–15.9)
Immature Grans (Abs): 0 10*3/uL (ref 0.0–0.1)
Immature Granulocytes: 0 %
Lymphocytes Absolute: 2 10*3/uL (ref 0.7–3.1)
Lymphs: 21 %
MCH: 30.6 pg (ref 26.6–33.0)
MCHC: 33.3 g/dL (ref 31.5–35.7)
MCV: 92 fL (ref 79–97)
Monocytes Absolute: 0.8 10*3/uL (ref 0.1–0.9)
Monocytes: 8 %
Neutrophils Absolute: 6.7 10*3/uL (ref 1.4–7.0)
Neutrophils: 69 %
Platelets: 329 10*3/uL (ref 150–450)
RBC: 4.68 x10E6/uL (ref 3.77–5.28)
RDW: 12.1 % (ref 11.7–15.4)
WBC: 9.7 10*3/uL (ref 3.4–10.8)

## 2021-04-21 LAB — COMPREHENSIVE METABOLIC PANEL
ALT: 10 IU/L (ref 0–32)
AST: 18 IU/L (ref 0–40)
Albumin/Globulin Ratio: 1.4 (ref 1.2–2.2)
Albumin: 4 g/dL (ref 3.6–4.6)
Alkaline Phosphatase: 88 IU/L (ref 44–121)
BUN/Creatinine Ratio: 18 (ref 12–28)
BUN: 16 mg/dL (ref 8–27)
Bilirubin Total: 0.3 mg/dL (ref 0.0–1.2)
CO2: 24 mmol/L (ref 20–29)
Calcium: 9.2 mg/dL (ref 8.7–10.3)
Chloride: 102 mmol/L (ref 96–106)
Creatinine, Ser: 0.89 mg/dL (ref 0.57–1.00)
Globulin, Total: 2.8 g/dL (ref 1.5–4.5)
Glucose: 89 mg/dL (ref 70–99)
Potassium: 4.2 mmol/L (ref 3.5–5.2)
Sodium: 140 mmol/L (ref 134–144)
Total Protein: 6.8 g/dL (ref 6.0–8.5)
eGFR: 64 mL/min/{1.73_m2} (ref >59)

## 2021-04-22 LAB — URINE CULTURE: Organism ID, Bacteria: NO GROWTH

## 2021-04-22 NOTE — Progress Notes (Signed)
?  ? ? ?Established patient visit ? ? ?Patient: Eileen Flores   DOB: 05-06-1935   86 y.o. Female  MRN: [id number] ?Visit Date: 04/23/2021 ? ?Today's healthcare provider: [name], PA-C  ? ?Chief Complaint  ?Patient presents with  ? Follow-up  ? ?Subjective  ?  ?HPI ? ?  ?Patient reports that she is doing well. She hydrates. Her bowel movements are back to normal.  ? ?Leukocytes in urine ?Denies having increased urinary frequency and urgency, abdominal pain, fever. ? ?Diarrhea ?Doing better, her bowel movements decreased to once daily. Stool is normal consistency and color. ? ?HTN ?Denies chest pain, rapid heart beating, shortness of breath ?Takes lisinopril 20 mg daily. Loves to have high-salt "tasty "meals. ?Denies having chest pain, rapid heart beating, cough, shortness of breath ?Has been having ankle swellings for as far as she can remember ? ?Medications: ?Outpatient Medications Prior to Visit  ?Medication Sig  ? Bioflavonoid Products (VITAMIN C) CHEW Chew by mouth.  ? Calcium Carb-Cholecalciferol 600-10 MG-MCG CHEW Chew by mouth.  ? levothyroxine (SYNTHROID) 112 MCG tablet Take 1 tablet (112 mcg total) by mouth daily before breakfast.  ? lisinopril (ZESTRIL) 20 MG tablet Take 1 tablet (20 mg total) by mouth daily.  ? Masks (SURGICAL FACE MASK/NIOSH N95) MISC Surgical mask for daily use while out in puclic  ? timolol (TIMOPTIC) 0.5 % ophthalmic solution Place 1 drop into both eyes daily.  ? ?No facility-administered medications prior to visit.  ? ? ?Review of Systems  ?Gastrointestinal: Negative.  Negative for abdominal pain, blood in stool, constipation, diarrhea and nausea.  ?Genitourinary:  Negative for decreased urine volume, difficulty urinating, dysuria, hematuria, urgency and vaginal discharge.  ?Musculoskeletal: Negative.   ?Skin: Negative.   ? ?  Objective  ?  ?BP (!) 181/69   Pulse 66   Resp 15   Wt 202 lb 12.8 oz (92 kg)   SpO2 99%   BMI 30.84 kg/m?  ? ? ?Physical Exam ?Vitals and nursing  note reviewed.  ?Constitutional:   ?   Appearance: Normal appearance. She is obese.  ?HENT:  ?   Head: Normocephalic and atraumatic.  ?Cardiovascular:  ?   Rate and Rhythm: Normal rate.  ?   Pulses: Normal pulses.  ?Pulmonary:  ?   Effort: Pulmonary effort is normal.  ?Abdominal:  ?   General: Abdomen is flat. Bowel sounds are normal.  ?   Palpations: Abdomen is soft.  ?   Tenderness: There is no abdominal tenderness. There is no right CVA tenderness or left CVA tenderness.  ?Musculoskeletal:  ?   Right lower leg: Edema (ankle, mild, visible varicose veins) present.  ?   Left lower leg: Edema (ankle, mild, visible varicose veins) present.  ?Neurological:  ?   Mental Status: She is alert.  ?  ? ? Assessment & Plan  ?  ? ?1. Leg swelling ?2/2 to venous insuffiencey ?- Patient advised on raising her feet, using compression socks ?- FU with Tally Joe ? ?2. Hypertension, unspecified type ?- not at goal ?- BP today was 181/69 , second reading was 170/ 68 ?- Continue Lisinopril and start Lasix on a trial base ?- furosemide (LASIX) 20 MG tablet; Take 1 tablet (20 mg total) by mouth daily.  Dispense: 30 tablet; Refill: 0 ?- Advised on salt restriction  ? ?3. Diarrhea, unspecified type ?- Resolved ? ?4. Sterile pyuria ?- asymptomatic ?- could be 2/2 diarrhea, not taking any medication that might elicit pyuria, no surgery,  no unintentional weight loss, no hx of DM ?- might consider to repeat Urinalysis in a month or two. ?- CT angio abdomen pelvis - 11/27/20- showed 2.5 cm hypoenhancing region in the inferior pole of the left ?kidney may be due to focal pyelonephritis. MRI of the abdomen is scheduled on 04/27/21 ? ?Fu with Daneil Dan in a month. ?The patient was advised to call back or seek an in-person evaluation if the symptoms worsen or if the condition fails to improve as anticipated. ? ?I discussed the assessment and treatment plan with the patient. The patient was provided an opportunity to ask questions and all were  answered. The patient agreed with the plan and demonstrated an understanding of the instructions. ? ?The entirety of the information documented in the History of Present Illness, Review of Systems and Physical Exam were personally obtained by me. Portions of this information were initially documented by the CMA and reviewed by me for thoroughness and accuracy.   ? ?[name], PA-C  ?[place] ?[phone] (phone) ?[phone] (fax) ? ?[facility] ?

## 2021-04-23 ENCOUNTER — Other Ambulatory Visit: Payer: Self-pay

## 2021-04-23 ENCOUNTER — Ambulatory Visit (INDEPENDENT_AMBULATORY_CARE_PROVIDER_SITE_OTHER): Payer: Medicare HMO | Admitting: Physician Assistant

## 2021-04-23 ENCOUNTER — Encounter: Payer: Self-pay | Admitting: Physician Assistant

## 2021-04-23 VITALS — BP 181/69 | HR 66 | Resp 15 | Wt 202.8 lb

## 2021-04-23 DIAGNOSIS — I10 Essential (primary) hypertension: Secondary | ICD-10-CM | POA: Diagnosis not present

## 2021-04-23 DIAGNOSIS — R8281 Pyuria: Secondary | ICD-10-CM

## 2021-04-23 DIAGNOSIS — R197 Diarrhea, unspecified: Secondary | ICD-10-CM | POA: Diagnosis not present

## 2021-04-23 DIAGNOSIS — M7989 Other specified soft tissue disorders: Secondary | ICD-10-CM | POA: Diagnosis not present

## 2021-04-23 MED ORDER — FUROSEMIDE 20 MG PO TABS: 20.0000 mg | ORAL_TABLET | Freq: Every day | ORAL | 0 refills | Status: DC

## 2021-04-26 ENCOUNTER — Telehealth: Payer: Self-pay

## 2021-04-26 DIAGNOSIS — M545 Low back pain, unspecified: Secondary | ICD-10-CM | POA: Diagnosis not present

## 2021-04-26 NOTE — Telephone Encounter (Signed)
Copied from [place] [id number]. Topic: General - Other ?>> Apr 26, 2021  3:29 PM [place], Gerlene Burdock wrote: ?Reason for CRM: pt returned call  back , says she doesn't know how her Bp is doing now because she doesn't have anything to check it with since being on furosemide (LASIX) 20 MG tablet . ?

## 2021-04-27 ENCOUNTER — Ambulatory Visit
Admission: RE | Admit: 2021-04-27 | Discharge: 2021-04-27 | Disposition: A | Discharge status: Home / Self Care | Payer: Medicare HMO | Source: Ambulatory Visit | Attending: Family Medicine | Admitting: Family Medicine

## 2021-04-27 ENCOUNTER — Other Ambulatory Visit: Payer: Self-pay

## 2021-04-27 DIAGNOSIS — K5730 Diverticulosis of large intestine without perforation or abscess without bleeding: Secondary | ICD-10-CM | POA: Diagnosis not present

## 2021-04-27 DIAGNOSIS — R918 Other nonspecific abnormal finding of lung field: Secondary | ICD-10-CM | POA: Insufficient documentation

## 2021-04-27 DIAGNOSIS — K449 Diaphragmatic hernia without obstruction or gangrene: Secondary | ICD-10-CM | POA: Diagnosis not present

## 2021-04-27 DIAGNOSIS — N281 Cyst of kidney, acquired: Secondary | ICD-10-CM | POA: Diagnosis not present

## 2021-04-27 DIAGNOSIS — N2889 Other specified disorders of kidney and ureter: Secondary | ICD-10-CM | POA: Insufficient documentation

## 2021-04-27 DIAGNOSIS — I700 Atherosclerosis of aorta: Secondary | ICD-10-CM | POA: Diagnosis not present

## 2021-04-27 MED ORDER — GADOBUTROL 1 MMOL/ML IV SOLN
9.0000 mL | Freq: Once | INTRAVENOUS | Status: AC | PRN
  Administered 2021-04-27: 9 mL via INTRAVENOUS

## 2021-04-27 NOTE — Telephone Encounter (Signed)
Lmtcb , patient has appt set for 05/21/21 for follow up PCP is asking patient return back the week on 05/11/21, if patient returns call [facility] please reschedule. KW ?

## 2021-04-29 ENCOUNTER — Other Ambulatory Visit: Payer: Self-pay | Admitting: Family Medicine

## 2021-04-29 DIAGNOSIS — N281 Cyst of kidney, acquired: Secondary | ICD-10-CM

## 2021-04-29 DIAGNOSIS — I10 Essential (primary) hypertension: Secondary | ICD-10-CM

## 2021-04-29 DIAGNOSIS — N2889 Other specified disorders of kidney and ureter: Secondary | ICD-10-CM

## 2021-05-01 NOTE — Telephone Encounter (Signed)
Refilled 04/06/2021 #30 1 refill- pt should have 30 day supply. ?Requested Prescriptions  ?Pending Prescriptions Disp Refills  ?? lisinopril (ZESTRIL) 20 MG tablet [Pharmacy Med Name: LISINOPRIL 20 MG TABLET] 30 tablet 1  ?  Sig: TAKE 1 TABLET BY MOUTH EVERY DAY  ?  ? Cardiovascular:  ACE Inhibitors Failed - 04/29/2021  1:33 PM  ?  ?  Failed - Last BP in normal range  ?  BP Readings from Last 1 Encounters:  ?04/23/21 (!) 181/69  ?   ?  ?  Passed - Cr in normal range and within 180 days  ?  Creatinine  ?Date Value Ref Range Status  ?07/07/2011 0.95 0.60 - 1.30 mg/dL Final  ? ?Creatinine, Ser  ?Date Value Ref Range Status  ?04/20/2021 0.89 0.57 - 1.00 mg/dL Final  ?   ?  ?  Passed - K in normal range and within 180 days  ?  Potassium  ?Date Value Ref Range Status  ?04/20/2021 4.2 3.5 - 5.2 mmol/L Final  ?07/07/2011 3.7 3.5 - 5.1 mmol/L Final  ?   ?  ?  Passed - Patient is not pregnant  ?  ?  Passed - Valid encounter within last 6 months  ?  Recent Outpatient Visits   ?      ? 1 week ago Leg swelling  ? [facility] [place], [place], [place]  ? 1 week ago Leukocytes in urine  ? [facility] [place], [place], [place]  ? 3 weeks ago Chronic bilateral low back pain without sciatica  ? [facility] Tally Joe T, FNP  ? 3 months ago Primary hypertension  ? [facility] Tally Joe T, FNP  ? 4 months ago Annual physical exam  ? [facility], Dionne Bucy, MD  ?  ?  ?Future Appointments   ?        ? In 1 week [name], [place], [facility]  ? In 1 month [name], [place], [facility]  ?  ? ?  ?  ?  ? ?

## 2021-05-04 DIAGNOSIS — M545 Low back pain, unspecified: Secondary | ICD-10-CM | POA: Diagnosis not present

## 2021-05-10 NOTE — Progress Notes (Signed)
? ? [employer] as a Education administrator for [name], FNP.,have documented all relevant documentation on the behalf of [name], FNP,as directed by  [name], FNP while in the presence of [name], FNP.  ? ? ?Established patient visit ? ? ?Patient: Eileen Flores   DOB: July 14, 1935   86 y.o. Female  MRN: [id number] ?Visit Date: 05/11/2021 ? ?Today's healthcare provider: [name], FNP  ?Re Introduced to nurse practitioner role and practice setting.  All questions answered.  Discussed provider/patient relationship and expectations. ? ? ?Chief Complaint  ?Patient presents with  ? Hypertension  ? Urinary Frequency  ? ?Subjective  ?  ?Urinary Frequency  ?This is a new problem. The current episode started yesterday. The problem has been unchanged. There has been no fever. Associated symptoms include frequency and urgency. Pertinent negatives include no chills, discharge, flank pain, hematuria, hesitancy, nausea, possible pregnancy, sweats or vomiting. She has tried nothing for the symptoms.   ? ?Hypertension, follow-up ? ?BP Readings from Last 3 Encounters:  ?05/11/21 (!) 143/61  ?04/23/21 (!) 181/69  ?04/20/21 (!) 150/65  ? Wt Readings from Last 3 Encounters:  ?05/11/21 198 lb 6.4 oz (90 kg)  ?04/23/21 202 lb 12.8 oz (92 kg)  ?04/20/21 202 lb 6.4 oz (91.8 kg)  ?  ? ?She was last seen for hypertension 3 weeks ago.  ?BP at that visit was 181/69. Management since that visit includes Continue Lisinopril and start Lasix on a trial base, advised salt restriction ? ?She reports excellent compliance with treatment. ?She is not having side effects.  ?She is following a Regular diet. ?She is not exercising. ?She does not smoke. ? ?Use of agents associated with hypertension: none.  ? ?Outside blood pressures are not checked. ?Symptoms: ?No chest pain No chest pressure  ?No palpitations No syncope  ?Yes dyspnea No orthopnea  ?No paroxysmal nocturnal dyspnea Yes lower extremity edema  ? ?Pertinent labs ?Lab  Results  ?Component Value Date  ? CHOL 205 (H) 12/21/2020  ? HDL 52 12/21/2020  ? LDLCALC 130 (H) 12/21/2020  ? TRIG 130 12/21/2020  ? CHOLHDL 3.9 12/21/2020  ? Lab Results  ?Component Value Date  ? NA 140 04/20/2021  ? K 4.2 04/20/2021  ? CREATININE 0.89 04/20/2021  ? EGFR 64 04/20/2021  ? GLUCOSE 89 04/20/2021  ? TSH 1.210 10/22/2018  ?  ? ?The ASCVD Risk score (Arnett DK, et al., 2019) failed to calculate for the following reasons: ?  The 2019 ASCVD risk score is only valid for ages 78 to 105 ? ?---------------------------------------------------------------------------------------------------  ? ?Medications: ?Outpatient Medications Prior to Visit  ?Medication Sig  ? Bioflavonoid Products (VITAMIN C) CHEW Chew by mouth.  ? Calcium Carb-Cholecalciferol 600-10 MG-MCG CHEW Chew by mouth.  ? levothyroxine (SYNTHROID) 112 MCG tablet Take 1 tablet (112 mcg total) by mouth daily before breakfast.  ? Masks (SURGICAL FACE MASK/NIOSH N95) MISC Surgical mask for daily use while out in puclic  ? timolol (TIMOPTIC) 0.5 % ophthalmic solution Place 1 drop into both eyes daily.  ? [DISCONTINUED] furosemide (LASIX) 20 MG tablet Take 1 tablet (20 mg total) by mouth daily.  ? [DISCONTINUED] lisinopril (ZESTRIL) 20 MG tablet Take 1 tablet (20 mg total) by mouth daily.  ? ?No facility-administered medications prior to visit.  ? ? ?Review of Systems  ?Constitutional:  Negative for chills.  ?Gastrointestinal:  Negative for nausea and vomiting.  ?Genitourinary:  Positive for frequency and urgency. Negative for flank pain,  hematuria and hesitancy.  ? ? ?  Objective  ?  ?BP (!) 143/61   Pulse 70   Temp 98.7 ?F (37.1 ?C) (Temporal)   Resp 16   Wt 198 lb 6.4 oz (90 kg)   SpO2 96%   BMI 30.17 kg/m?  ? ? ?Physical Exam ?Vitals and nursing note reviewed.  ?Constitutional:   ?   General: She is not in acute distress. ?   Appearance: Normal appearance. She is obese. She is not ill-appearing, toxic-appearing or diaphoretic.  ?HENT:  ?    Head: Normocephalic and atraumatic.  ?Cardiovascular:  ?   Rate and Rhythm: Normal rate and regular rhythm.  ?   Pulses: Normal pulses.  ?   Heart sounds: Normal heart sounds. No murmur heard. ?  No friction rub. No gallop.  ?Pulmonary:  ?   Effort: Pulmonary effort is normal. No respiratory distress.  ?   Breath sounds: Normal breath sounds. No stridor. No wheezing, rhonchi or rales.  ?Chest:  ?   Chest wall: No tenderness.  ?Abdominal:  ?   General: Bowel sounds are normal.  ?   Palpations: Abdomen is soft.  ?Musculoskeletal:     ?   General: No swelling, tenderness, deformity or signs of injury. Normal range of motion.  ?   Right lower leg: Edema present.  ?   Left lower leg: Edema present.  ?Skin: ?   General: Skin is warm and dry.  ?   Capillary Refill: Capillary refill takes less than 2 seconds.  ?   Coloration: Skin is not jaundiced or pale.  ?   Findings: No bruising, erythema, lesion or rash.  ?Neurological:  ?   General: No focal deficit present.  ?   Mental Status: She is alert and oriented to person, place, and time. Mental status is at baseline.  ?   Cranial Nerves: No cranial nerve deficit.  ?   Sensory: No sensory deficit.  ?   Motor: No weakness.  ?   Coordination: Coordination normal.  ?Psychiatric:     ?   Mood and Affect: Mood normal.     ?   Behavior: Behavior normal.     ?   Thought Content: Thought content normal.     ?   Judgment: Judgment normal.  ?  ? ? ?Results for orders placed or performed in visit on 05/11/21  ?POCT Urinalysis Dipstick  ?Result Value Ref Range  ? Color, UA yellow   ? Clarity, UA clear   ? Glucose, UA Negative Negative  ? Bilirubin, UA negative   ? Ketones, UA negative   ? Spec Grav, UA <=1.005 (A) 1.010 - 1.025  ? Blood, UA non hemolyzed trace   ? pH, UA 5.0 5.0 - 8.0  ? Protein, UA Negative Negative  ? Urobilinogen, UA 0.2 0.2 or 1.0 E.U./dL  ? Nitrite, UA negative   ? Leukocytes, UA Trace (A) Negative  ? Appearance    ? Odor    ? ? Assessment & Plan  ?  ? ?Problem List  Items Addressed This Visit   ? ?  ? Cardiovascular and Mediastinum  ? Primary hypertension - Primary  ?  Chronic, improved from 180s to 140s today ?Denies CP ?Denies SOB/ DOE ?Denies low blood pressure/hypotension ?Denies vision changes ?Non pitting LE Edema noted on exam ?Continue medication, Lisinopril 20 mg and Lasix 20 mg ?Denies side effects ?Refills stable ?F/u for CPE in 12/2021 ?Seek emergent care if you develop chest  pain or chest pressure ? ?  ?  ? Relevant Medications  ? furosemide (LASIX) 20 MG tablet  ? lisinopril (ZESTRIL) 20 MG tablet  ?  ? Other  ? Leg swelling  ?  Acute on chronic concern ?Continue lasix 20 mg to assist ?Continue DASH diet to assist with HTN and edema control ?  ?  ? Relevant Medications  ? furosemide (LASIX) 20 MG tablet  ? Urinary frequency  ?  Recurrent concern ?UA with trace of blood ?Will send for Cx ?Pt continues to refuse use of urology to assist ?Negative CVA  ?Negative bladder tenderness or distension  ?  ?  ? Relevant Orders  ? POCT Urinalysis Dipstick (Completed)  ? Urine Culture  ? ? ? ?Return in about 7 months (around 12/11/2021) for annual examination.  ?   ?I, [name], FNP, have reviewed all documentation for this visit. The documentation on 05/11/21 for the exam, diagnosis, procedures, and orders are all accurate and complete. ? ?[name], FNP  ?[place] ?[phone] (phone) ?[phone] (fax) ? ?[facility] ?

## 2021-05-11 ENCOUNTER — Encounter: Payer: Self-pay | Admitting: Family Medicine

## 2021-05-11 ENCOUNTER — Ambulatory Visit (INDEPENDENT_AMBULATORY_CARE_PROVIDER_SITE_OTHER): Payer: Medicare HMO | Admitting: Family Medicine

## 2021-05-11 VITALS — BP 143/61 | HR 70 | Temp 98.7°F | Resp 16 | Wt 198.4 lb

## 2021-05-11 DIAGNOSIS — R350 Frequency of micturition: Secondary | ICD-10-CM | POA: Diagnosis not present

## 2021-05-11 DIAGNOSIS — I10 Essential (primary) hypertension: Secondary | ICD-10-CM

## 2021-05-11 DIAGNOSIS — M7989 Other specified soft tissue disorders: Secondary | ICD-10-CM

## 2021-05-11 LAB — POCT URINALYSIS DIPSTICK
Bilirubin, UA: NEGATIVE
Glucose, UA: NEGATIVE
Ketones, UA: NEGATIVE
Nitrite, UA: NEGATIVE
Protein, UA: NEGATIVE
Spec Grav, UA: <=1.005 — AB (ref 1.010–1.025)
Urobilinogen, UA: 0.2 E.U./dL
pH, UA: 5 (ref 5.0–8.0)

## 2021-05-11 MED ORDER — FUROSEMIDE 20 MG PO TABS: 20.0000 mg | ORAL_TABLET | Freq: Every day | ORAL | 1 refills | Status: DC

## 2021-05-11 MED ORDER — LISINOPRIL 20 MG PO TABS: 20.0000 mg | ORAL_TABLET | Freq: Every day | ORAL | 1 refills | Status: DC

## 2021-05-11 NOTE — Assessment & Plan Note (Signed)
Chronic, improved from 180s to 140s today ?Denies CP ?Denies SOB/ DOE ?Denies low blood pressure/hypotension ?Denies vision changes ?Non pitting LE Edema noted on exam ?Continue medication, Lisinopril 20 mg and Lasix 20 mg ?Denies side effects ?Refills stable ?F/u for CPE in 12/2021 ?Seek emergent care if you develop chest pain or chest pressure ? ?

## 2021-05-11 NOTE — Assessment & Plan Note (Signed)
Recurrent concern ?UA with trace of blood ?Will send for Cx ?Pt continues to refuse use of urology to assist ?Negative CVA  ?Negative bladder tenderness or distension  ?

## 2021-05-11 NOTE — Assessment & Plan Note (Signed)
Acute on chronic concern ?Continue lasix 20 mg to assist ?Continue DASH diet to assist with HTN and edema control ?

## 2021-05-13 LAB — URINE CULTURE: Organism ID, Bacteria: NO GROWTH

## 2021-05-18 DIAGNOSIS — E890 Postprocedural hypothyroidism: Secondary | ICD-10-CM | POA: Diagnosis not present

## 2021-05-18 DIAGNOSIS — H6123 Impacted cerumen, bilateral: Secondary | ICD-10-CM | POA: Diagnosis not present

## 2021-05-21 ENCOUNTER — Ambulatory Visit: Payer: Medicare HMO | Admitting: Family Medicine

## 2021-06-16 NOTE — Progress Notes (Signed)
?  ? ? ?Established patient visit ? ? ?Patient: Eileen Flores   DOB: [birth year]   86 y.o. Female  MRN: [id number] ?Visit Date: 06/18/2021 ? ?Today's healthcare provider: [name], FNP  ?Re Introduced to nurse practitioner role and practice setting.  All questions answered.  Discussed provider/patient relationship and expectations. ? ? ?[employer] as a Education administrator for [name], FNP.,have documented all relevant documentation on the behalf of [name], FNP,as directed by  [name], FNP while in the presence of [name], FNP.  ?Chief Complaint  ?Patient presents with  ? Hypertension  ? ?Subjective  ?  ?HPI  ?Hypertension, follow-up ? ?BP Readings from Last 3 Encounters:  ?06/18/21 138/64  ?05/11/21 (!) 143/61  ?04/23/21 (!) 181/69  ? Wt Readings from Last 3 Encounters:  ?06/18/21 197 lb 3.2 oz (89.4 kg)  ?05/11/21 198 lb 6.4 oz (90 kg)  ?04/23/21 202 lb 12.8 oz (92 kg)  ?  ? ?She was last seen for hypertension 1 months ago.  ?BP at that visit was 143/61. Management since that visit includes Continue medication, Lisinopril 20 mg and Lasix 20 mg. ? ?She reports fair compliance with treatment. ?She is having side effects. Patient reports that she suffered vision loss , nausea and dizziness on Lasix. Will stop routine use. Recommend 1-2 days of daily PRN use if LE edema worsens and/or weight increases overnight. ?She is following a Regular diet. ?She is not exercising. ?She does not smoke. ? ?Use of agents associated with hypertension: none.  ? ?Outside blood pressures are systolic ranging from 97-673 and diastolic ranging from 41-93. ? ?Symptoms: ?No chest pain No chest pressure  ?No palpitations No syncope  ?No dyspnea No orthopnea  ?No paroxysmal nocturnal dyspnea Yes lower extremity edema  ? ?Pertinent labs ?Lab Results  ?Component Value Date  ? CHOL 205 (H) 12/21/2020  ? HDL 52 12/21/2020  ? LDLCALC 130 (H) 12/21/2020  ? TRIG 130 12/21/2020  ? CHOLHDL 3.9 12/21/2020  ? Lab Results   ?Component Value Date  ? NA 140 04/20/2021  ? K 4.2 04/20/2021  ? CREATININE 0.89 04/20/2021  ? EGFR 64 04/20/2021  ? GLUCOSE 89 04/20/2021  ? TSH 1.210 10/22/2018  ?  ? ?The ASCVD Risk score (Arnett DK, et al., 2019) failed to calculate for the following reasons: ?  The 2019 ASCVD risk score is only valid for ages 84 to 42 ? ?---------------------------------------------------------------------------------------------------  ? ?Medications: ?Outpatient Medications Prior to Visit  ?Medication Sig  ? Bioflavonoid Products (VITAMIN C) CHEW Chew by mouth.  ? Calcium Carb-Cholecalciferol 600-10 MG-MCG CHEW Chew by mouth.  ? levothyroxine (SYNTHROID) 112 MCG tablet Take 1 tablet (112 mcg total) by mouth daily before breakfast.  ? lisinopril (ZESTRIL) 20 MG tablet Take 1 tablet (20 mg total) by mouth daily.  ? Masks (SURGICAL FACE MASK/NIOSH N95) MISC Surgical mask for daily use while out in puclic  ? timolol (TIMOPTIC) 0.5 % ophthalmic solution Place 1 drop into both eyes daily.  ? [DISCONTINUED] furosemide (LASIX) 20 MG tablet Take 1 tablet (20 mg total) by mouth daily. (Patient not taking: Reported on 06/18/2021)  ? ?No facility-administered medications prior to visit.  ? ? ?Review of Systems ? ? ?  Objective  ?  ?BP 138/64   Pulse 65   Temp 97.7 ?F (36.5 ?C) (Oral)   Resp 16   Wt 197 lb 3.2 oz (89.4 kg)   SpO2 97%   BMI 29.98 kg/m?  ? ? ?  Physical Exam ?Vitals and nursing note reviewed.  ?Constitutional:   ?   General: She is not in acute distress. ?   Appearance: Normal appearance. She is overweight. She is not ill-appearing, toxic-appearing or diaphoretic.  ?HENT:  ?   Head: Normocephalic and atraumatic.  ?Cardiovascular:  ?   Rate and Rhythm: Normal rate and regular rhythm.  ?   Pulses: Normal pulses.  ?   Heart sounds: Normal heart sounds. No murmur heard. ?  No friction rub. No gallop.  ?Pulmonary:  ?   Effort: Pulmonary effort is normal. No respiratory distress.  ?   Breath sounds: Normal breath sounds. No  stridor. No wheezing, rhonchi or rales.  ?Chest:  ?   Chest wall: No tenderness.  ?Abdominal:  ?   General: Bowel sounds are normal.  ?   Palpations: Abdomen is soft.  ?Musculoskeletal:     ?   General: No swelling, tenderness, deformity or signs of injury. Normal range of motion.  ?   Right lower leg: No edema.  ?   Left lower leg: No edema.  ?Skin: ?   General: Skin is warm and dry.  ?   Capillary Refill: Capillary refill takes less than 2 seconds.  ?   Coloration: Skin is not jaundiced or pale.  ?   Findings: No bruising, erythema, lesion or rash.  ?Neurological:  ?   General: No focal deficit present.  ?   Mental Status: She is alert and oriented to person, place, and time. Mental status is at baseline.  ?   Cranial Nerves: No cranial nerve deficit.  ?   Sensory: No sensory deficit.  ?   Motor: No weakness.  ?   Coordination: Coordination normal.  ?Psychiatric:     ?   Mood and Affect: Mood normal.     ?   Behavior: Behavior normal.     ?   Thought Content: Thought content normal.     ?   Judgment: Judgment normal.  ?  ? ?No results found for any visits on 06/18/21. ? Assessment & Plan  ?  ? ?Problem List Items Addressed This Visit   ? ?  ? Cardiovascular and Mediastinum  ? Primary hypertension  ?  Chronic, stable; now at goal ?Denies CP ?Denies SOB/ DOE ?Denies low blood pressure/hypotension ?Denies vision changes ?No LE Edema noted on exam ?Continue medication, Lisinopril 20 mg QD; PRN Lasix 20 mg for weight/fluid gain ?Denies side effects from Lisinopril ?Complaints of blurred vision from daily use of Lasix ?RTC in 6 months ?Seek emergent care if you develop chest pain or chest pressure ? ?  ?  ? Relevant Medications  ? furosemide (LASIX) 20 MG tablet  ?  ? Other  ? Leg swelling  ?  Chronic, improved with BP control ?Will change Lasix to PRN for fluid/weight gain ?RTC in 6 months ? ?  ?  ? Relevant Medications  ? furosemide (LASIX) 20 MG tablet  ? ?Return in about 6 months (around 12/19/2021) for annual  examination.  ?   ?I, [name], FNP, have reviewed all documentation for this visit. The documentation on 06/18/21 for the exam, diagnosis, procedures, and orders are all accurate and complete. ? ?[name], FNP  ?[place] ?[phone] (phone) ?[phone] (fax) ? ?[facility] ?

## 2021-06-18 ENCOUNTER — Ambulatory Visit (INDEPENDENT_AMBULATORY_CARE_PROVIDER_SITE_OTHER): Payer: Medicare HMO | Admitting: Family Medicine

## 2021-06-18 ENCOUNTER — Encounter: Payer: Self-pay | Admitting: Family Medicine

## 2021-06-18 DIAGNOSIS — M7989 Other specified soft tissue disorders: Secondary | ICD-10-CM

## 2021-06-18 DIAGNOSIS — I10 Essential (primary) hypertension: Secondary | ICD-10-CM

## 2021-06-18 MED ORDER — FUROSEMIDE 20 MG PO TABS: 20.0000 mg | ORAL_TABLET | Freq: Every day | ORAL | 1 refills | Status: DC | PRN

## 2021-06-18 NOTE — Assessment & Plan Note (Signed)
Chronic, improved with BP control ?Will change Lasix to PRN for fluid/weight gain ?RTC in 6 months ?

## 2021-06-18 NOTE — Assessment & Plan Note (Signed)
Chronic, stable; now at goal ?Denies CP ?Denies SOB/ DOE ?Denies low blood pressure/hypotension ?Denies vision changes ?No LE Edema noted on exam ?Continue medication, Lisinopril 20 mg QD; PRN Lasix 20 mg for weight/fluid gain ?Denies side effects from Lisinopril ?Complaints of blurred vision from daily use of Lasix ?RTC in 6 months ?Seek emergent care if you develop chest pain or chest pressure ? ?

## 2021-06-25 ENCOUNTER — Encounter: Payer: Self-pay | Admitting: Family Medicine

## 2021-06-25 ENCOUNTER — Ambulatory Visit (INDEPENDENT_AMBULATORY_CARE_PROVIDER_SITE_OTHER): Payer: Medicare HMO | Admitting: Family Medicine

## 2021-06-25 VITALS — BP 141/70 | HR 61 | Temp 98.5°F | Resp 16 | Wt 197.8 lb

## 2021-06-25 DIAGNOSIS — R350 Frequency of micturition: Secondary | ICD-10-CM

## 2021-06-25 DIAGNOSIS — B3731 Acute candidiasis of vulva and vagina: Secondary | ICD-10-CM | POA: Diagnosis not present

## 2021-06-25 DIAGNOSIS — N898 Other specified noninflammatory disorders of vagina: Secondary | ICD-10-CM

## 2021-06-25 MED ORDER — HYDROCORTISONE 1 % EX CREA: 1.0000 "application " | TOPICAL_CREAM | Freq: Two times a day (BID) | CUTANEOUS | 0 refills | Status: DC

## 2021-06-25 MED ORDER — FLUCONAZOLE 150 MG PO TABS: ORAL_TABLET | ORAL | 0 refills | Status: DC

## 2021-06-25 NOTE — Assessment & Plan Note (Signed)
At site of vaginal opening, likely related to vaginal dryness Referral to GYN to assist with further medication options  BP is elevated; chronic concern

## 2021-06-25 NOTE — Assessment & Plan Note (Signed)
Acute, stable Denies STI Denies cytology with NuSwab Will treat with diflucan- ability to treat with 2 tablets Encouraged to return to clinic/follow up with GYN/urology if symptoms remain/do not improve

## 2021-06-25 NOTE — Assessment & Plan Note (Signed)
Chronic, stable With some incontinence Use of pads as well May be associated with itching as well d/t dermatitis Denies STI/gross irritation  referral to urology for further medication options to assist with QOL

## 2021-06-25 NOTE — Progress Notes (Signed)
I,[name],acting as a Education administrator for Eileen Sprout, FNP.,have documented all relevant documentation on the behalf of Eileen Sprout, FNP,as directed by  Eileen Sprout, FNP while in the presence of Eileen Sprout, FNP.   Established patient visit   Patient: Eileen Flores   DOB: [birth year]   86 y.o. Female  MRN: [id number] Visit Date: 06/25/2021  Today's healthcare provider: [name], FNP  Re Introduced to nurse practitioner role and practice setting.  All questions answered.  Discussed provider/patient relationship and expectations.   Chief Complaint  Patient presents with   Vaginal Itching   Subjective    Patient presents for vaginal itching / outside with slight clear discharge. Reports years since she had a yeast infection, but that's what it feels like.   Onset last night. Using OTC vaginal itch cream.    Medications: Outpatient Medications Prior to Visit  Medication Sig   Bioflavonoid Products (VITAMIN C) CHEW Chew by mouth.   Calcium Carb-Cholecalciferol 600-10 MG-MCG CHEW Chew by mouth.   furosemide (LASIX) 20 MG tablet Take 1 tablet (20 mg total) by mouth daily as needed for edema. Recommend PRN use for weight gain/fluid gain in BLE.   levothyroxine (SYNTHROID) 112 MCG tablet Take 1 tablet (112 mcg total) by mouth daily before breakfast.   lisinopril (ZESTRIL) 20 MG tablet Take 1 tablet (20 mg total) by mouth daily.   Masks (SURGICAL FACE MASK/NIOSH N95) MISC Surgical mask for daily use while out in puclic   timolol (TIMOPTIC) 0.5 % ophthalmic solution Place 1 drop into both eyes daily.   No facility-administered medications prior to visit.    Review of Systems     Objective    BP (!) 141/70 (BP Location: Right Arm, Patient Position: Sitting, Cuff Size: Normal)   Pulse 61   Temp 98.5 F (36.9 C) (Oral)   Resp 16   Wt 197 lb 12.8 oz (89.7 kg)   SpO2 100%   BMI 30.08 kg/m    Physical Exam Vitals and nursing note reviewed.  Constitutional:       General: She is not in acute distress.    Appearance: Normal appearance. She is obese. She is not ill-appearing, toxic-appearing or diaphoretic.  HENT:     Head: Normocephalic and atraumatic.  Cardiovascular:     Rate and Rhythm: Normal rate and regular rhythm.     Pulses: Normal pulses.     Heart sounds: Normal heart sounds. No murmur heard.   No friction rub. No gallop.  Pulmonary:     Effort: Pulmonary effort is normal. No respiratory distress.     Breath sounds: Normal breath sounds. No stridor. No wheezing, rhonchi or rales.  Chest:     Chest wall: No tenderness.  Abdominal:     General: Bowel sounds are normal.     Palpations: Abdomen is soft.  Musculoskeletal:        General: No swelling, tenderness, deformity or signs of injury. Normal range of motion.     Right lower leg: No edema.     Left lower leg: No edema.  Skin:    General: Skin is warm and dry.     Capillary Refill: Capillary refill takes less than 2 seconds.     Coloration: Skin is not jaundiced or pale.     Findings: No bruising, erythema, lesion or rash.  Neurological:     General: No focal deficit present.     Mental Status: She is alert  and oriented to person, place, and time. Mental status is at baseline.     Cranial Nerves: No cranial nerve deficit.     Sensory: No sensory deficit.     Motor: No weakness.     Coordination: Coordination normal.  Psychiatric:        Mood and Affect: Mood normal.        Behavior: Behavior normal.        Thought Content: Thought content normal.        Judgment: Judgment normal.      No results found for any visits on 06/25/21.  Assessment & Plan     Problem List Items Addressed This Visit       Genitourinary   Vaginal itching    At site of vaginal opening, likely related to vaginal dryness Referral to GYN to assist with further medication options  BP is elevated; chronic concern        Relevant Medications   hydrocortisone cream 1 %   Other Relevant Orders    Ambulatory referral to Gynecology   Yeast infection involving the vagina and surrounding area - Primary    Acute, stable Denies STI Denies cytology with NuSwab Will treat with diflucan- ability to treat with 2 tablets Encouraged to return to clinic/follow up with GYN/urology if symptoms remain/do not improve       Relevant Medications   fluconazole (DIFLUCAN) 150 MG tablet   Other Relevant Orders   Ambulatory referral to Gynecology     Other   Urinary frequency    Chronic, stable With some incontinence Use of pads as well May be associated with itching as well d/t dermatitis Denies STI/gross irritation  referral to urology for further medication options to assist with QOL       Relevant Orders   Ambulatory referral to Urology     Return if symptoms worsen or fail to improve.      Eileen Kotyk, FNP, have reviewed all documentation for this visit. The documentation on 06/25/21 for the exam, diagnosis, procedures, and orders are all accurate and complete.    Eileen Flores, [place] [phone] (phone) [phone] (fax)  [place]

## 2021-06-28 ENCOUNTER — Telehealth: Payer: Self-pay

## 2021-06-28 NOTE — Telephone Encounter (Signed)
[facility] referring for for Vaginal itching. Schedule with any provider. Called and left voicemail for patient to call back to be scheduled.

## 2021-06-28 NOTE — Telephone Encounter (Signed)
-----   Message from Excell Seltzer, RN sent at 06/28/2021  1:27 PM EDT ----- Regarding: referral Schedule with any provider for Vaginal itching.  Would be a good patient for [place] to see.  1-2 weeks or sooner if possible. Thanks, [employer]

## 2021-06-29 NOTE — Telephone Encounter (Signed)
Patient is scheduled for 07/13/21 with JEG

## 2021-07-01 DIAGNOSIS — I10 Essential (primary) hypertension: Secondary | ICD-10-CM | POA: Diagnosis not present

## 2021-07-01 DIAGNOSIS — N2889 Other specified disorders of kidney and ureter: Secondary | ICD-10-CM | POA: Insufficient documentation

## 2021-07-01 DIAGNOSIS — N281 Cyst of kidney, acquired: Secondary | ICD-10-CM | POA: Diagnosis not present

## 2021-07-01 DIAGNOSIS — K219 Gastro-esophageal reflux disease without esophagitis: Secondary | ICD-10-CM | POA: Insufficient documentation

## 2021-07-01 DIAGNOSIS — R82998 Other abnormal findings in urine: Secondary | ICD-10-CM | POA: Insufficient documentation

## 2021-07-13 ENCOUNTER — Encounter: Payer: Medicare HMO | Admitting: Advanced Practice Midwife

## 2021-07-14 ENCOUNTER — Encounter: Payer: Self-pay | Admitting: Obstetrics

## 2021-07-14 ENCOUNTER — Ambulatory Visit: Payer: Medicare HMO | Admitting: Obstetrics

## 2021-07-14 VITALS — BP 160/80 | Wt 197.4 lb

## 2021-07-14 DIAGNOSIS — N760 Acute vaginitis: Secondary | ICD-10-CM

## 2021-07-14 DIAGNOSIS — L292 Pruritus vulvae: Secondary | ICD-10-CM | POA: Diagnosis not present

## 2021-07-14 DIAGNOSIS — B9689 Other specified bacterial agents as the cause of diseases classified elsewhere: Secondary | ICD-10-CM

## 2021-07-14 NOTE — Patient Instructions (Signed)
Have a great year! Please call with any concerns. Don't forget to wear your seatbelt everyday! If you are not signed up on MyChart, please ask [place] how to sign up for it!   Aquaphor                                                                                                                                                                                                 In a world where you can be anything, please be kind.   Body mass index is 30.01 kg/m.  A Healthy Lifestyle: Care Instructions Your Care Instructions  A healthy lifestyle can help you feel good, stay at a healthy weight, and have plenty of energy for both work and play. A healthy lifestyle is something you can share with your whole family. A healthy lifestyle also can lower your risk for serious health problems, such as high blood pressure, heart disease, and diabetes. You can follow a few steps listed below to improve your health and the health of your family. Follow-up care is a key part of your treatment and safety. Be sure to make and go to all appointments, and call your doctor if you are having problems. It's also a good idea to know your test results and keep a list of the medicines you take. How can you care for yourself at home? Do not eat too much sugar, fat, or fast foods. You can still have dessert and treats now and then. The goal is moderation. Start small to improve your eating habits. Pay attention to portion sizes, drink less juice and soda pop, and eat more fruits and vegetables. Eat a healthy amount of food. A 3-ounce serving of meat, for example, is about the size of a deck of cards. Fill the rest of your plate with vegetables and whole grains. Limit the amount of soda and sports drinks you have every day. Drink more water when you are thirsty. Eat at least 5 servings of fruits and vegetables every day. It may seem like a lot, but it is not hard to reach this goal. A serving or helping is 1 piece of fruit, 1  cup of vegetables, or 2 cups of leafy, raw vegetables. Have an apple or some carrot sticks as an afternoon snack instead of a candy bar. Try to have fruits and/or vegetables at every meal. Make exercise part of your daily routine. You may want to start with simple activities, such as walking, bicycling, or slow swimming. Try to be active 30 to 60 minutes every day. You do not need to do all 30 to  60 minutes all at once. For example, you can exercise 3 times a day for 10 or 20 minutes. Moderate exercise is safe for most people, but it is always a good idea to talk to your doctor before starting an exercise program. Keep moving. Mow the lawn, work in the garden, or [employer]. Take the stairs instead of the elevator at work. If you smoke, quit. People who smoke have an increased risk for heart attack, stroke, cancer, and other lung illnesses. Quitting is hard, but there are ways to boost your chance of quitting tobacco for good. Use nicotine gum, patches, or lozenges. Ask your doctor about stop-smoking programs and medicines. Keep trying. In addition to reducing your risk of diseases in the future, you will notice some benefits soon after you stop using tobacco. If you have shortness of breath or asthma symptoms, they will likely get better within a few weeks after you quit. Limit how much alcohol you drink. Moderate amounts of alcohol (up to 2 drinks a day for men, 1 drink a day for women) are okay. But drinking too much can lead to liver problems, high blood pressure, and other health problems. Family health If you have a family, there are many things you can do together to improve your health. Eat meals together as a family as often as possible. Eat healthy foods. This includes fruits, vegetables, lean meats and dairy, and whole grains. Include your family in your fitness plan. Most people think of activities such as jogging or tennis as the way to fitness, but there are many ways you and your  family can be more active. Anything that makes you breathe hard and gets your heart pumping is exercise. Here are some tips: Walk to do errands or to take your child to school or the bus. Go for a family bike ride after dinner instead of watching TV. Care instructions adapted under license by your healthcare professional. This care instruction is for use with your licensed healthcare professional. If you have questions about a medical condition or this instruction, always ask your healthcare professional. [employer] any warranty or liability for your use of this information.

## 2021-07-14 NOTE — Progress Notes (Signed)
Chief Complaint  Patient presents with   Vaginal Itching    Patient presents in office today with complaints of vaginal itching, paitent was seen by PCP on 06/25/20 and treat with Diflucan. Patient states that symptom did not improve on medication and states that she now has vulva swelling.    HPI: Patient Eileen Flores is an 86 y.o. year old G2P2 No LMP recorded. Patient is postmenopausal. who presents for presents for evaluation of a vulvar problem. She has been having itching and was treated with diflucan. This did not help her symptoms. She was also rx hydrocortisone but she did not take this. She did take OTC vagisil. She reports her temp went down to 96. She is now having swelling. She has not tried sitz baths. This has been going on a few weeks. Has not tried aquaphor. Patient reports white discharge. She wears a daily pad due to leaking/incontinence. Denies vaginal bleeding. Has had some increased stools this week. Patient denies any recent change in soap or detergent. Patient denies change in sexual partners. She is not sexually active. Patient denies history of pelvic infections. Patient denies domestic violence or sexual abuse. She has not had anything like this in several yers. She will get an occasional UTI with frequency and pain. Last UTI treated in January  Reports her symptoms have been better today   Review of Systems  Constitutional:  Negative for activity change, appetite change, chills, diaphoresis, fatigue, fever and unexpected weight change.  HENT:  Positive for sneezing. Negative for congestion, dental problem, drooling, ear discharge, ear pain, facial swelling, hearing loss, mouth sores, nosebleeds, postnasal drip, rhinorrhea, sinus pressure, sinus pain, sore throat, tinnitus, trouble swallowing and voice change.   Eyes:  Negative for photophobia, pain, discharge, redness, itching and visual disturbance.  Respiratory:  Positive for cough and shortness of breath. Negative for  apnea, choking, chest tightness, wheezing and stridor.   Cardiovascular:  Negative for chest pain, palpitations and leg swelling.  Gastrointestinal:  Negative for abdominal distention, abdominal pain, anal bleeding, blood in stool, constipation, diarrhea, nausea, rectal pain and vomiting.  Endocrine: Negative for cold intolerance, heat intolerance, polydipsia, polyphagia and polyuria.  Genitourinary:  Positive for frequency and vaginal discharge. Negative for decreased urine volume, difficulty urinating, dyspareunia, dysuria, enuresis, flank pain, genital sores, hematuria, menstrual problem, pelvic pain, urgency, vaginal bleeding and vaginal pain.  Musculoskeletal:  Negative for arthralgias, back pain, gait problem, joint swelling, myalgias, neck pain and neck stiffness.  Skin:  Negative for color change, pallor, rash and wound.  Allergic/Immunologic: Negative for environmental allergies, food allergies and immunocompromised state.  Neurological:  Negative for dizziness, tremors, seizures, syncope, facial asymmetry, speech difficulty, weakness, light-headedness, numbness and headaches.  Hematological:  Negative for adenopathy. Does not bruise/bleed easily.  Psychiatric/Behavioral:  Negative for agitation, behavioral problems, confusion, decreased concentration, dysphoric mood, hallucinations, self-injury, sleep disturbance and suicidal ideas. The patient is not nervous/anxious and is not hyperactive.     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   cataract surgery Bilateral 2009   COLONOSCOPY  2011   Dr. Bary Castilla   dermbrasion   1965   OVARIAN CYST REMOVAL  1967   POLYPECTOMY  2006   THYROIDECTOMY  2006   [place]  2009   Family History  Problem Relation Age of Onset   Colon cancer Other    Colon polyps Other    Cancer Mother 77       liver cancer   Cancer Father 42       liver cancer   Alcohol abuse Father    Breast cancer Neg Hx    Social History   Socioeconomic History   Marital status: Widowed    Spouse name: Not on file   Number of children: 2   Years of education: Not on file   Highest education level: Some college, no degree  Occupational History   Not on file  Tobacco Use   Smoking status: Former    Types: Cigarettes   Smokeless tobacco: Never   Tobacco comments:    quit in mid to late 20's  Vaping Use   Vaping Use: Never used  Substance and Sexual Activity   Alcohol use: No    Comment: rare- 1 drink   Drug use: No   Sexual activity: Not on file  Other Topics Concern   Not on file  Social History Narrative   Not on file   Social Determinants of Health   Financial Resource Strain: Low Risk    Difficulty of Paying Living Expenses: Not hard at all  Food Insecurity: No Food Insecurity   Worried About Charity fundraiser in the Last Year: Never true   [place] in the Last Year: Never true  Transportation Needs: No Transportation Needs   Lack of Transportation (Medical): No   Lack of Transportation (Non-Medical): No  Physical Activity:  Inactive   Days of Exercise per Week: 0 days   Minutes of Exercise per Session: 0 min  Stress: No Stress Concern Present   Feeling of Stress : Not at all  Social Connections: Moderately Isolated   Frequency of Communication with Friends and Family: More than three times a week   Frequency of Social Gatherings with Friends and Family: More than three times a week   Attends Religious Services: More than 4 times per year   Active Member of [employer] or Organizations: No   Attends Archivist Meetings: Never   Marital Status: Widowed  Human resources officer Violence: Not At Risk   Fear of Current or Ex-Partner: No   Emotionally Abused: No   Physically Abused: No   Sexually Abused: No   Medicine list and allergies reviewed and updated.    Objective:  BP (!) 160/80   Wt 197 lb 6.4 oz (89.5 kg)   BMI 30.01 kg/m  Physical Exam Vitals reviewed. Exam conducted with a chaperone present.  Constitutional:      General: She is not in acute distress.  Appearance: Normal appearance. She is not ill-appearing.  HENT:     Head: Normocephalic and atraumatic.  Eyes:     Extraocular Movements: Extraocular movements intact.  Cardiovascular:     Rate and Rhythm: Normal rate.  Pulmonary:     Effort: Pulmonary effort is normal. No respiratory distress.  Genitourinary:    General: Normal vulva.     Exam position: Lithotomy position.     Pubic Area: No rash.      Labia:        Right: No tenderness or lesion.        Left: No tenderness or lesion.      Urethra: No urethral lesion.     Vagina: Normal.     Comments: Atrophic external genitalia, perirectal inclusion cyst 2 mm noted; no significant erythema noted, no swelling noted,  non tender  Musculoskeletal:        General: Normal range of motion.     Cervical back: Normal range of motion.  Lymphadenopathy:     Lower Body: No right inguinal adenopathy. No left inguinal adenopathy.  Skin:    General: Skin is warm and dry.  Neurological:      General: No focal deficit present.     Mental Status: She is alert and oriented to person, place, and time. Mental status is at baseline.     Coordination: Coordination normal.     Gait: Gait normal.  Psychiatric:        Mood and Affect: Mood normal.        Behavior: Behavior normal.        Thought Content: Thought content normal.        Judgment: Judgment normal.    Assessment/Plan:   Vulvar itching  For symptoms VG obtained Exam appears benign today Recommend aquaphor and she can pick up the hydrocortisone as well Return warnings  Return if symptoms worsen or fail to improve.

## 2021-07-19 ENCOUNTER — Other Ambulatory Visit (HOSPITAL_COMMUNITY)
Admission: RE | Admit: 2021-07-19 | Discharge: 2021-07-19 | Disposition: A | Discharge status: Home / Self Care | Payer: Medicare HMO | Source: Ambulatory Visit | Attending: Advanced Practice Midwife | Admitting: Advanced Practice Midwife

## 2021-07-19 DIAGNOSIS — L292 Pruritus vulvae: Secondary | ICD-10-CM | POA: Diagnosis present

## 2021-07-19 NOTE — Addendum Note (Signed)
Addended by: Brien Few on: 07/19/2021 09:09 AM   Modules accepted: Orders

## 2021-07-20 LAB — CERVICOVAGINAL ANCILLARY ONLY
Bacterial Vaginitis (gardnerella): POSITIVE — AB
Candida Glabrata: NEGATIVE
Candida Vaginitis: NEGATIVE
Chlamydia: NEGATIVE
Comment: NEGATIVE
Comment: NEGATIVE
Comment: NEGATIVE
Comment: NEGATIVE
Comment: NEGATIVE
Comment: NORMAL
Neisseria Gonorrhea: NEGATIVE
Trichomonas: NEGATIVE

## 2021-07-21 MED ORDER — METRONIDAZOLE 500 MG PO TABS: 500.0000 mg | ORAL_TABLET | Freq: Two times a day (BID) | ORAL | 0 refills | Status: DC

## 2021-07-21 NOTE — Addendum Note (Signed)
Addended by: Drenda Freeze on: 07/21/2021 10:38 AM   Modules accepted: Orders

## 2021-07-26 DIAGNOSIS — Z961 Presence of intraocular lens: Secondary | ICD-10-CM | POA: Diagnosis not present

## 2021-07-26 DIAGNOSIS — H401131 Primary open-angle glaucoma, bilateral, mild stage: Secondary | ICD-10-CM | POA: Diagnosis not present

## 2021-09-06 ENCOUNTER — Ambulatory Visit: Payer: Medicare HMO | Admitting: Urology

## 2021-09-28 ENCOUNTER — Ambulatory Visit: Payer: Self-pay

## 2021-09-28 NOTE — Telephone Encounter (Signed)
Chief Complaint: UTI Symptoms: urinary frequency, urgency, burning at night  Frequency: 2-3 days Pertinent Negatives: Patient denies fever Disposition: '[]'$ ED /'[]'$ Urgent Care (no appt availability in office) / '[x]'$ Appointment(In office/virtual)/ '[]'$  [facility]/ '[]'$ Home Care/ '[]'$ Refused Recommended Disposition /'[]'$ [facility]/ '[]'$  Follow-up with PCP Additional Notes: pt did home UA test. Advised her she still should come for OV. Pt agreed and scheduled for tomorrow at 1400 with [place], [place].  Summary: Possible uti?   Patient called in stating she has had urinary frequency and urgency to go. She did at home test strips and the top said positive and the bottom said negative. Can she get something called in for this. Her pharmacy is   [id number] [place] [address]  Phone: [phone] Fax: [phone]     Reason for Disposition  Urinating more frequently than usual (i.e., frequency)  Answer Assessment - Initial Assessment Questions 1. SYMPTOM: "What's the main symptom you're concerned about?" (e.g., frequency, incontinence)     Frequency and urgency  2. ONSET: "When did the  sx  start?"     2-3 days  5. OTHER SYMPTOMS: "Do you have any other symptoms?" (e.g., blood in urine, fever, flank pain, pain with urination)     Burning with urination at night  Protocols used: Urinary Symptoms-A-AH

## 2021-09-28 NOTE — Progress Notes (Unsigned)
     I,Jana Leinaala Catanese,acting as a Education administrator for [employer], PA-C.,have documented all relevant documentation on the behalf of [name], PA-C,as directed by  [employer], PA-C while in the presence of [employer], PA-C.   Established patient visit   Patient: Eileen Flores   DOB: [birth year]   86 y.o. Female  MRN: [id number] Visit Date: 09/29/2021  Today's healthcare provider: [name], PA-C   No chief complaint on file.  Subjective    Urinary symptoms  She reports {chronicity:119221} {urinary symptoms:765916}. The current episode started {onset initial:119223} and is {progression:119226}. Patient states symptoms are {severity:119268} in intensity, occurring {frequency of symptoms:119294}. She  {recent treatment:18834} been recently treated for similar symptoms.    Associated symptoms: {Yes/No:20286} abdominal pain {Yes/No:20286} back pain  {Yes/No:20286} chills {Yes/No:20286} constipation  {Yes/No:20286} cramping {Yes/No:20286} diarrhea  {Yes/No:20286} discharge {Yes/No:20286} fever  {Yes/No:20286} hematuria {Yes/No:20286} nausea  {Yes/No:20286} vomiting    ---------------------------------------------------------------------------------------   Medications: Outpatient Medications Prior to Visit  Medication Sig   metroNIDAZOLE (FLAGYL) 500 MG tablet Take 1 tablet (500 mg total) by mouth 2 (two) times daily.   Bioflavonoid Products (VITAMIN C) CHEW Chew by mouth.   Calcium Carb-Cholecalciferol 600-10 MG-MCG CHEW Chew by mouth.   furosemide (LASIX) 20 MG tablet Take 1 tablet (20 mg total) by mouth daily as needed for edema. Recommend PRN use for weight gain/fluid gain in BLE.   levothyroxine (SYNTHROID) 112 MCG tablet Take 1 tablet (112 mcg total) by mouth daily before breakfast.   lisinopril (ZESTRIL) 20 MG tablet Take 1 tablet (20 mg total) by mouth daily.   Masks (SURGICAL FACE MASK/NIOSH N95) MISC Surgical mask for daily use while out in puclic   timolol (TIMOPTIC)  0.5 % ophthalmic solution Place 1 drop into both eyes daily.   No facility-administered medications prior to visit.    Review of Systems  {Labs  Heme  Chem  Endocrine  Serology  Results Review (optional):23779}   Objective    There were no vitals taken for this visit. {Show previous vital signs (optional):23777}  Physical Exam  ***  No results found for any visits on 09/29/21.  Assessment & Plan     ***  No follow-ups on file.      {provider attestation***:1}   [name], Hershal Coria  [facility] [phone] (phone) [phone] (fax)  [place]

## 2021-09-29 ENCOUNTER — Ambulatory Visit (INDEPENDENT_AMBULATORY_CARE_PROVIDER_SITE_OTHER): Payer: Medicare HMO | Admitting: Physician Assistant

## 2021-09-29 ENCOUNTER — Encounter: Payer: Self-pay | Admitting: Physician Assistant

## 2021-09-29 VITALS — BP 136/74 | HR 67 | Temp 97.7°F | Resp 16 | Wt 197.0 lb

## 2021-09-29 DIAGNOSIS — I10 Essential (primary) hypertension: Secondary | ICD-10-CM | POA: Diagnosis not present

## 2021-09-29 DIAGNOSIS — R300 Dysuria: Secondary | ICD-10-CM | POA: Diagnosis not present

## 2021-09-29 LAB — POCT URINALYSIS DIPSTICK
Bilirubin, UA: NEGATIVE
Blood, UA: NEGATIVE
Glucose, UA: NEGATIVE
Ketones, UA: NEGATIVE
Nitrite, UA: NEGATIVE
Protein, UA: NEGATIVE
Spec Grav, UA: 1.01 (ref 1.010–1.025)
Urobilinogen, UA: 1 E.U./dL
pH, UA: 6 (ref 5.0–8.0)

## 2021-09-29 MED ORDER — CEPHALEXIN 500 MG PO CAPS: 500.0000 mg | ORAL_CAPSULE | Freq: Four times a day (QID) | ORAL | 0 refills | Status: DC

## 2021-10-08 ENCOUNTER — Ambulatory Visit: Payer: Self-pay

## 2021-10-08 NOTE — Telephone Encounter (Signed)
Pt was seen in office on 09/29/21 for positive UTI. Pt reports that the sx still have not cleared up. Please advise      Chief Complaint: Finished Keflex and continues to have low back pain. Seen 09/29/21. Did OTC urinary test, still "shows infection." Symptoms: Above Frequency: Today Pertinent Negatives: Patient denies fever Disposition: '[]'$ ED /'[]'$ Urgent Care (no appt availability in office) / '[]'$ Appointment(In office/virtual)/ '[]'$  [place] [facility]/ '[]'$ Home Care/ '[]'$ Refused Recommended Disposition /'[]'$ [facility]/ '[x]'$  Follow-up with PCP Additional Notes: Please advise pt.  Answer Assessment - Initial Assessment Questions 1. MAIN SYMPTOM: "What is the main symptom you are concerned about?" (e.g., painful urination, urine frequency)     Back 2. BETTER-SAME-WORSE: "Are you getting better, staying the same, or getting worse compared to how you felt at your last visit to the doctor (most recent medical visit)?"     Better 3. PAIN: "How bad is the pain?"  (e.g., Scale 1-10; mild, moderate, or severe)   - MILD (1-3): complains slightly about urination hurting   - MODERATE (4-7): interferes with normal activities     - SEVERE (8-10): excruciating, unwilling or unable to urinate because of the pain      Now - 7 4. FEVER: "Do you have a fever?" If Yes, ask: "What is it, how was it measured, and when did it start?"     No 5. OTHER SYMPTOMS: "Do you have any other symptoms?" (e.g., blood in the urine, flank pain, vaginal discharge)     No 6. DIAGNOSIS: "When was the UTI diagnosed?" "By whom?" "Was it a kidney infection, bladder infection or both?"     [place] 7. ANTIBIOTIC: "What antibiotic(s) are you taking?" "How many times per day?"     Finished Keflex. 8. ANTIBIOTIC - START DATE: "When did you start taking the antibiotic?"     09/29/21.  Protocols used: Urinary Tract Infection on Antibiotic Follow-up Call - [facility]

## 2021-10-09 ENCOUNTER — Ambulatory Visit
Admission: RE | Admit: 2021-10-09 | Discharge: 2021-10-09 | Disposition: A | Discharge status: Home / Self Care | Payer: Medicare HMO | Source: Ambulatory Visit | Attending: Internal Medicine | Admitting: Internal Medicine

## 2021-10-09 VITALS — BP 150/134 | HR 61 | Temp 98.2°F | Resp 17 | Ht 69.0 in | Wt 197.0 lb

## 2021-10-09 DIAGNOSIS — N3000 Acute cystitis without hematuria: Secondary | ICD-10-CM | POA: Diagnosis not present

## 2021-10-09 DIAGNOSIS — B379 Candidiasis, unspecified: Secondary | ICD-10-CM

## 2021-10-09 LAB — URINALYSIS, MICROSCOPIC (REFLEX)

## 2021-10-09 LAB — URINALYSIS, ROUTINE W REFLEX MICROSCOPIC
Bilirubin Urine: NEGATIVE
Glucose, UA: NEGATIVE mg/dL
Ketones, ur: NEGATIVE mg/dL
Nitrite: NEGATIVE
Protein, ur: NEGATIVE mg/dL
Specific Gravity, Urine: 1.015 (ref 1.005–1.030)
pH: 5.5 (ref 5.0–8.0)

## 2021-10-09 MED ORDER — NITROFURANTOIN MONOHYD MACRO 100 MG PO CAPS: 100.0000 mg | ORAL_CAPSULE | Freq: Two times a day (BID) | ORAL | 0 refills | Status: DC

## 2021-10-09 MED ORDER — FLUCONAZOLE 150 MG PO TABS: 150.0000 mg | ORAL_TABLET | Freq: Once | ORAL | 0 refills | Status: AC

## 2021-10-09 NOTE — ED Triage Notes (Signed)
Pt reports she saw her PA, Eliezer Lofts on the 23rd of August and was treated for UTI and states her symptoms of low back pain, frequent urination x day 3.

## 2021-10-09 NOTE — ED Provider Notes (Signed)
[facility] - [place], [place]   Name: Eileen Flores DOB: May 01, 1935 MRN: [id number] CSN: [id number] PCP: [name], FNP  Arrival date and time:  10/09/21 1340  Chief Complaint:  Urinary Frequency (Entered by patient)   NOTE: Prior to seeing the patient today, I have reviewed the triage nursing documentation and vital signs. Clinical staff has updated patient's PMH/PSHx, current medication list, and drug allergies/intolerances to ensure comprehensive history available to assist in medical decision making.   History:   HPI: Eileen Flores is a 86 y.o. female who presents today with complaints of dysuria and flank pain.  Patient states the symptoms have been ongoing x2 weeks.  She was prescribed Keflex for a urinary tract infection by her primary care provider's office.  Unfortunately urine culture was not completed at that time.  She states her symptoms have slightly resolved upon finishing cephalexin, but she continues to have mild dysuria and back pain.  She took an at-home test for leukocytes which returned as positive so she is here for further evaluation.  She denies systemic symptoms such as fever, nausea, general malaise.  She also denies discharge or malodor.  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   cataract surgery Bilateral 2009   COLONOSCOPY  2011   Dr. Bary Castilla   dermbrasion   1965   OVARIAN CYST REMOVAL  1967   POLYPECTOMY  2006   THYROIDECTOMY  2006   [place]  2009    Family History  Problem Relation Age of Onset   Colon cancer Other    Colon polyps Other    Cancer Mother 108       liver cancer   Cancer Father 30       liver cancer   Alcohol abuse Father    Breast cancer Neg Hx     Social History   Tobacco Use   Smoking status: Former    Types: Cigarettes   Smokeless tobacco: Never   Tobacco comments:    quit in mid to late 20's  Vaping Use   Vaping Use: Never used  Substance Use Topics   Alcohol use: No    Comment: rare- 1 drink   Drug use: No    Patient Active Problem List   Diagnosis Date Noted   Dysuria 09/29/2021   Vaginal itching 06/25/2021   Yeast infection involving the vagina and surrounding area 06/25/2021   Leg swelling 05/11/2021   Chronic bilateral low back  pain without sciatica 04/06/2021   Urinary frequency 04/06/2021   Use of cane as ambulatory aid 04/06/2021   Other specified disorders of kidney and ureter 04/06/2021   Lung nodule, multiple 04/06/2021   Breast calcification, left 04/06/2021   Mixed stress and urge urinary incontinence 01/18/2021   Primary hypertension 01/18/2021   Risk for falls 01/18/2021   Need for shingles vaccine 01/18/2021   Vitamin D deficiency 12/21/2020   Hyperlipidemia 06/14/2017   GERD (gastroesophageal reflux disease) 05/05/2016   Anxiety 11/16/2015   Arthritis 11/16/2015   Bunion 11/16/2015   Bursitis 11/16/2015   Colon polyp 11/16/2015   DD (diverticular disease) 11/16/2015   Hammer toe 11/16/2015   Acquired hypothyroidism 11/16/2015   Tendinitis 11/16/2015   History  of breast cancer 02/21/2013   DCIS (ductal carcinoma in situ) of breast 01/17/2013   Papilloma of breast 01/17/2013   Breast neoplasm, Tis (DCIS), left 01/07/2013    Home Medications:    Current Meds  Medication Sig   fluconazole (DIFLUCAN) 150 MG tablet Take 1 tablet (150 mg total) by mouth once for 1 dose.   nitrofurantoin, macrocrystal-monohydrate, (MACROBID) 100 MG capsule Take 1 capsule (100 mg total) by mouth 2 (two) times daily for 7 days.    Allergies:   Penicillin g and Penicillins  Review of Systems (ROS): Review of Systems  Constitutional:  Negative for chills, fatigue and fever.  Gastrointestinal:  Negative for abdominal pain, diarrhea, nausea and vomiting.  Genitourinary:  Positive for dysuria, flank pain and frequency. Negative for difficulty urinating and pelvic pain.  Skin:  Negative for color change.  All other systems reviewed and are negative.    Vital Signs: Today's Vitals   10/09/21 1408 10/09/21 1412  BP: (!) 150/134   Pulse: 61   Resp: 17   Temp: 98.2 F (36.8 C)   TempSrc: Oral   SpO2: 96%   Weight:  197 lb (89.4 kg)  Height:  '5\' 9"'$  (1.753 m)  PainSc:  2     Physical Exam: Physical Exam Vitals and nursing note reviewed.  Constitutional:      Appearance: Normal appearance.  Cardiovascular:     Rate and Rhythm: Normal rate and regular rhythm.     Pulses: Normal pulses.     Heart sounds: Normal heart sounds.  Pulmonary:     Effort: Pulmonary effort is normal.     Breath sounds: Normal breath sounds.  Abdominal:     Tenderness: There is no abdominal tenderness. There is no right CVA tenderness or left CVA tenderness.  Skin:    General: Skin is warm and dry.  Neurological:     General: No focal deficit present.     Mental Status: She is alert and oriented to person, place, and time.  Psychiatric:        Mood and Affect: Mood normal.        Behavior: Behavior normal.      Urgent Care Treatments / Results:   LABS: PLEASE NOTE: all  labs that were ordered this encounter are listed, however only abnormal results are displayed. Labs Reviewed  URINALYSIS, ROUTINE W REFLEX MICROSCOPIC - Abnormal; Notable for the following components:      Result Value   Hgb urine dipstick SMALL (*)    Leukocytes,Ua MODERATE (*)    All other components within normal limits  URINALYSIS, MICROSCOPIC (REFLEX) - Abnormal; Notable for the following components:   Bacteria, UA FEW (*)    All other components within normal limits  URINE CULTURE    EKG: -None  RADIOLOGY: No results found.  PROCEDURES: Procedures  MEDICATIONS RECEIVED THIS VISIT: Medications - No data to display  PERTINENT CLINICAL COURSE NOTES/UPDATES:   Initial Impression / Assessment and Plan / Urgent Care Course:  Pertinent labs & imaging results that were available during my care of the patient were personally reviewed by me and considered in my medical decision making (see lab/imaging section of note for values and interpretations).  Eileen Flores is a 86 y.o. female who presents to [facility] today with complaints of dysuria, diagnosed with urinary tract infection and vulvovaginal yeast, and treated as such with the medications below. NP and patient reviewed discharge instructions below during visit.   Patient is well appearing overall in clinic today. She does not appear to be in any acute distress. Presenting symptoms (see HPI) and exam as documented above.   I have reviewed the follow up and strict return precautions for any new or worsening symptoms. Patient is aware of symptoms that would be deemed urgent/emergent, and would thus require further evaluation either here or in the emergency department. At the time of discharge, she verbalized understanding and consent with the discharge plan as it was reviewed with her. All questions were fielded by provider and/or clinic staff prior to patient discharge.    Final Clinical Impressions / Urgent Care  Diagnoses:   Final diagnoses:  Acute cystitis without hematuria  Yeast infection    New Prescriptions:  [place] Controlled Substance Registry consulted? Not Applicable  Meds ordered this encounter  Medications   nitrofurantoin, macrocrystal-monohydrate, (MACROBID) 100 MG capsule    Sig: Take 1 capsule (100 mg total) by mouth 2 (two) times daily for 7 days.    Dispense:  14 capsule    Refill:  0   fluconazole (DIFLUCAN) 150 MG tablet    Sig: Take 1 tablet (150 mg total) by mouth once for 1 dose.    Dispense:  1 tablet    Refill:  0      Discharge Instructions      You were seen for urinary discomfort and are being treated for urinary tract infection/yeast infection.   - We are sending your urine out for a culture. If we need to add or change any medications, our nurse will give you a call to let you know. - Take the antibiotics as prescribed until they're finished. If you think you're having a reaction, stop the medication, take benadryl and go to the nearest urgent care/emergency room. Take a probiotic while taking the antibiotic to decrease the chances of stomach upset.  -You are being prescribed antifungal medication for yeast infection.  Take the 1 pill at the end of your antibiotic course. -Continue increasing your hydration -Check your blood pressure at home daily.  Sent a message to your primary care provider if it continues to be elevated.  Take care, Dr. [name], NP-c     Recommended Follow up Care:  Patient encouraged to follow up with the following provider within the specified time frame, or sooner as dictated by the severity of her symptoms. As always, she was instructed that for any urgent/emergent care needs, she should seek care either here or in the emergency department for more immediate evaluation.   Gertie Baron, DNP, NP-c   Gertie Baron, NP 10/09/21 [phone]

## 2021-10-09 NOTE — Discharge Instructions (Addendum)
You were seen for urinary discomfort and are being treated for urinary tract infection/yeast infection.   - We are sending your urine out for a culture. If we need to add or change any medications, our nurse will give you a call to let you know. - Take the antibiotics as prescribed until they're finished. If you think you're having a reaction, stop the medication, take benadryl and go to the nearest urgent care/emergency room. Take a probiotic while taking the antibiotic to decrease the chances of stomach upset.  -You are being prescribed antifungal medication for yeast infection.  Take the 1 pill at the end of your antibiotic course. -Continue increasing your hydration -Check your blood pressure at home daily.  Sent a message to your primary care provider if it continues to be elevated.  Take care, Dr. [name], NP-c

## 2021-10-10 ENCOUNTER — Other Ambulatory Visit: Payer: Self-pay | Admitting: Family Medicine

## 2021-10-10 DIAGNOSIS — I10 Essential (primary) hypertension: Secondary | ICD-10-CM

## 2021-10-11 LAB — URINE CULTURE

## 2021-10-13 ENCOUNTER — Telehealth: Payer: Self-pay

## 2021-10-13 ENCOUNTER — Other Ambulatory Visit: Payer: Self-pay | Admitting: Family Medicine

## 2021-10-13 ENCOUNTER — Ambulatory Visit: Payer: Self-pay | Admitting: *Deleted

## 2021-10-13 ENCOUNTER — Ambulatory Visit: Payer: Self-pay

## 2021-10-13 NOTE — Telephone Encounter (Signed)
  Chief Complaint:  alert,  patient's son reports patient took 3 asa and now is having difficulty staying alert, fatigue, diarrhea Symptoms: see above, patient's son clarified patient did not take 3 Asprin but took 2 tabs 325 mg acetaminophen at 3:20 mg and became sick on stomach , diarrhea and is very weak difficulty sitting up.  Frequency: na Pertinent Negatives: Patient denies chest pain no difficulty breathing Disposition: '[x]'$ ED /'[]'$ Urgent Care (no appt availability in office) / '[]'$ Appointment(In office/virtual)/ '[]'$  [place] [facility]/ '[]'$ Home Care/ '[]'$ Refused Recommended Disposition /'[]'$ [place] [facility]/ '[]'$  Follow-up with PCP Additional Notes:   Called 911 for assistance. Patient's son poor historian. EMS arrived and assessing patient.     Reason for Disposition  [1] SEVERE weakness (i.e., unable to walk or barely able to walk, requires support) AND [2] new-onset or worsening  Answer Assessment - Initial Assessment Questions 1. DESCRIPTION: "Describe how you are feeling."     Extreme fatigue some dizziness diarrhea  2. SEVERITY: "How bad is it?"  "Can you stand and walk?"   - MILD (0-3): Feels weak or tired, but does not interfere with work, school or normal activities.   - MODERATE (4-7): Able to stand and walk; weakness interferes with work, school, or normal activities.   - SEVERE (8-10): Unable to stand or walk; unable to do usual activities.     severe 3. ONSET: "When did these symptoms begin?" (e.g., hours, days, weeks, months)     3:20 pm  4. CAUSE: "What do you think is causing the weakness or fatigue?" (e.g., not drinking enough fluids, medical problem, trouble sleeping)     Diarrhea  5. NEW MEDICINES:  "Have you started on any new medicines recently?" (e.g., opioid pain medicines, benzodiazepines, muscle relaxants, antidepressants, antihistamines, neuroleptics, beta blockers)     Took 3 tabs 325 mg acetominophen 6. OTHER SYMPTOMS: "Do you have any other symptoms?"  (e.g., chest pain, fever, cough, SOB, vomiting, diarrhea, bleeding, other areas of pain)     Diarrhea, fatigue dizziness. Took 3 acetaminophen at 3:20 pm. 7. PREGNANCY: "Is there any chance you are pregnant?" "When was your last menstrual period?"     na  Protocols used: Weakness (Generalized) and Fatigue-A-AH

## 2021-10-13 NOTE — Telephone Encounter (Signed)
Spoke with patient, she was confused about urine results from urgent care. Has a continued fever. Has appt with [place] tomorrow.

## 2021-10-13 NOTE — Telephone Encounter (Signed)
Patient was called in another encounter to schedule for a nurse visit BP check. She mentioned she spoke to a triage nurse this morning about running a fever and what to take, she advised Tylenol. Patient asked if that was ok to take, I agreed to take Tylenol for fever. She says she is still having back pain and was called by the UC to tell her not to take the antibiotic Nitrofurantoin because she doesn't have a UTI, so she's unsure of what's going on. I scheduled with Denna Haggard, PA-C for tomorrow at 1300.

## 2021-10-13 NOTE — Telephone Encounter (Signed)
Pt was seen at [facility] and diagnosed with a yeast infection / she was prescribed fluconazole (DIFLUCAN) 150 MG tablet / pt took it yesterday and today has a fever of 100.9/ pt asked what she can take that wont interact with it / please advise    Chief Complaint: Fever 101-100.9. Had symptoms of UTI, seen in [facility] 10/09/21. "They said I do not have a UTI." Still has some burning.Asking for PCP advice. Symptoms: Above Frequency: Last week Pertinent Negatives: Patient denies  Disposition: '[]'$ ED /'[]'$ Urgent Care (no appt availability in office) / '[]'$ Appointment(In office/virtual)/ '[]'$  [place] [facility]/ '[]'$ Home Care/ '[]'$ Refused Recommended Disposition /'[]'$ [place] [facility]/ '[x]'$  Follow-up with PCP Additional Notes: Please advise pt.  Answer Assessment - Initial Assessment Questions 1. TEMPERATURE: "What is the most recent temperature?"  "How was it measured?"      100.9 2. ONSET: "When did the fever start?"      Yesterday 3. CHILLS: "Do you have chills?" If yes: "How bad are they?"  (e.g., none, mild, moderate, severe)   - NONE: no chills   - MILD: feeling cold   - MODERATE: feeling very cold, some shivering (feels better under a thick blanket)   - SEVERE: feeling extremely cold with shaking chills (general body shaking, rigors; even under a thick blanket)      Mild 4. OTHER SYMPTOMS: "Do you have any other symptoms besides the fever?"  (e.g., abdomen pain, cough, diarrhea, earache, headache, sore throat, urination pain)     No 5. CAUSE: If there are no symptoms, ask: "What do you think is causing the fever?"      UTI - Has burning 6. CONTACTS: "Does anyone else in the family have an infection?"     No 7. TREATMENT: "What have you done so far to treat this fever?" (e.g., medications)     Diflucan 8. IMMUNOCOMPROMISE: "Do you have of the following: diabetes, HIV positive, splenectomy, cancer chemotherapy, chronic steroid treatment, transplant patient, etc."     No 9. PREGNANCY: "Is there any chance  you are pregnant?" "When was your last menstrual period?"     No 10. TRAVEL: "Have you traveled out of the country in the last month?" (e.g., travel history, exposures)       NO  Protocols used: [facility]

## 2021-10-13 NOTE — Telephone Encounter (Signed)
-----   Message from [name], FNP sent at 10/13/2021  7:50 AM EDT ----- Regarding: FW: Bp from today Encourage HTN f/u appt with CMA within 1-2 weeks.  Thanks ----- Message ----- From: Gertie Baron, NP Sent: 10/09/2021   3:01 PM EDT To: [name], FNP Subject: Bp from today                                  Laurence Aly!  So great to see your name as a primary! Just wanted to let you know about this lady I saw in the urgent care today. Her blood pressure has been creeping up, with it being 150/130's at her visit today for a UTI. She says it's due to stress, but I don't buy that completely. She has no htn crisis symptoms today. She says she taking her meds as prescribed, so just wanted to put it on your radar if you want your nurse to check in sometime this upcoming week.   Hope you're doing well! The patients in [place] are super lucky to have you.   Lunise (previous Scientist, physiological)

## 2021-10-13 NOTE — Telephone Encounter (Signed)
Patient called and given message below, scheduled for 10/20/21 at 0910 as a nurse visit for BP check.

## 2021-10-14 ENCOUNTER — Encounter: Payer: Self-pay | Admitting: Physician Assistant

## 2021-10-14 ENCOUNTER — Ambulatory Visit (INDEPENDENT_AMBULATORY_CARE_PROVIDER_SITE_OTHER): Payer: Medicare HMO | Admitting: Physician Assistant

## 2021-10-14 VITALS — BP 144/84 | HR 75 | Temp 98.8°F | Resp 16 | Wt 192.8 lb

## 2021-10-14 DIAGNOSIS — I10 Essential (primary) hypertension: Secondary | ICD-10-CM

## 2021-10-14 DIAGNOSIS — B3731 Acute candidiasis of vulva and vagina: Secondary | ICD-10-CM

## 2021-10-14 DIAGNOSIS — R300 Dysuria: Secondary | ICD-10-CM | POA: Diagnosis not present

## 2021-10-14 DIAGNOSIS — M7989 Other specified soft tissue disorders: Secondary | ICD-10-CM

## 2021-10-14 DIAGNOSIS — R350 Frequency of micturition: Secondary | ICD-10-CM | POA: Diagnosis not present

## 2021-10-14 NOTE — Progress Notes (Signed)
I,Harjot Zavadil Robinson,acting as a Education administrator for [employer], PA-C.,have documented all relevant documentation on the behalf of [name], PA-C,as directed by  [employer], PA-C while in the presence of [employer], PA-C.    Established patient visit   Patient: Eileen Flores   DOB: [birth year]   86 y.o. Female  MRN: [id number] Visit Date: 10/14/2021  Today's healthcare provider: [name], PA-C   Chief Complaint  Patient presents with  . Hypertension   Subjective    Hypertension, follow-up  BP Readings from Last 3 Encounters:  10/14/21 (!) 144/84  10/09/21 (!) 150/134  09/29/21 136/74   Wt Readings from Last 3 Encounters:  10/14/21 192 lb 12.8 oz (87.5 kg)  10/09/21 197 lb (89.4 kg)  09/29/21 197 lb (89.4 kg)     She was last seen for hypertension 3 weeks ago.  BP at that visit was 136/74 . Management since that visit includes continue current medication.  She reports excellent compliance with treatment. She is not having side effects.  She is following a Regular diet. She is not exercising. She does not smoke.  Use of agents associated with hypertension: none.   Outside blood pressures are . Symptoms:has not taken recently  No chest pain No chest pressure  No palpitations No syncope  No dyspnea No orthopnea  No paroxysmal nocturnal dyspnea No lower extremity edema   Pertinent labs Lab Results  Component Value Date   CHOL 205 (H) 12/21/2020   HDL 52 12/21/2020   LDLCALC 130 (H) 12/21/2020   TRIG 130 12/21/2020   CHOLHDL 3.9 12/21/2020   Lab Results  Component Value Date   NA 140 04/20/2021   K 4.2 04/20/2021   CREATININE 0.89 04/20/2021   EGFR 64 04/20/2021   GLUCOSE 89 04/20/2021   TSH 1.210 10/22/2018     The ASCVD Risk score (Arnett DK, et al., 2019) failed to calculate for the following reasons:   The 2019 ASCVD risk score is only valid for ages 28 to  26  ---------------------------------------------------------------------------------------------------   Medications: Outpatient Medications Prior to Visit  Medication Sig  . Bioflavonoid Products (VITAMIN C) CHEW Chew by mouth.  . Calcium Carb-Cholecalciferol 600-10 MG-MCG CHEW Chew by mouth.  . furosemide (LASIX) 20 MG tablet Take 1 tablet (20 mg total) by mouth daily as needed for edema. Recommend PRN use for weight gain/fluid gain in BLE.  [name] levothyroxine (SYNTHROID) 112 MCG tablet Take 1 tablet (112 mcg total) by mouth daily before breakfast.  . lisinopril (ZESTRIL) 20 MG tablet TAKE 1 TABLET BY MOUTH EVERY DAY  . Masks (SURGICAL FACE MASK/NIOSH N95) MISC Surgical mask for daily use while out in puclic  . timolol (TIMOPTIC) 0.5 % ophthalmic solution Place 1 drop into both eyes daily.  . fluconazole (DIFLUCAN) 150 MG tablet Take 150 mg by mouth once.  . nitrofurantoin, macrocrystal-monohydrate, (MACROBID) 100 MG capsule Take 1 capsule (100 mg total) by mouth 2 (two) times daily for 7 days.   No facility-administered medications prior to visit.    Review of Systems  All other systems reviewed and are negative.  Except see HPI {Labs  Heme  Chem  Endocrine  Serology  Results Review (optional):23779}   Objective     {Show previous vital signs (optional):23777} Vitals:   10/14/21 1313 10/14/21 1318  BP: (!) 147/63 (!) 144/84  Pulse: 75   Resp: 16   Temp: 98.8 F (37.1 C)   SpO2: 94%     Physical Exam Vitals reviewed.  Constitutional:      General: She is not in acute distress.    Appearance: Normal appearance. She is well-developed. She is not diaphoretic.  HENT:     Head: Normocephalic and atraumatic.  Eyes:     General: No scleral icterus.    Conjunctiva/sclera: Conjunctivae normal.  Neck:     Thyroid: No thyromegaly.  Cardiovascular:     Rate and Rhythm: Normal rate and regular rhythm.     Pulses: Normal pulses.     Heart sounds: Normal heart sounds. No  murmur heard. Pulmonary:     Effort: Pulmonary effort is normal. No respiratory distress.     Breath sounds: Normal breath sounds. No wheezing, rhonchi or rales.  Musculoskeletal:     Cervical back: Neck supple.     Right lower leg: No edema.     Left lower leg: No edema.  Lymphadenopathy:     Cervical: No cervical adenopathy.  Skin:    General: Skin is warm and dry.     Findings: No rash.  Neurological:     Mental Status: She is alert and oriented to person, place, and time. Mental status is at baseline.  Psychiatric:        Behavior: Behavior normal.        Thought Content: Thought content normal.        Judgment: Judgment normal.     No results found for any visits on 10/14/21.  Assessment & Plan     1. Dysuria GFR 64 with WNL BUN and creatinine, albumin/globulin ration Advised to properly hydrate  Unclear if the symptoms resolve. Pt needs to have an urine sample  for UA, micro and Ur cx  2. Primary hypertension BP 144/84 today Pt was advised to start taking lisinopril 69m daily instead of 2108mRecommended to adhere to low salt diet and to stay active  3. Yeast infection involving the vagina and surrounding area Completed a course of abx  4. Urinary frequency Unclear if the symptoms resolve. Pt needs to have an urine sample  for UA, micro and Ur cx  5. Leg swelling Without pitting edema      The patient was advised to call back or seek an in-person evaluation if the symptoms worsen or if the condition fails to improve as anticipated.  I discussed the assessment and treatment plan with the patient. The patient was provided an opportunity to ask questions and all were answered. The patient agreed with the plan and demonstrated an understanding of the instructions.  The entirety of the information documented in the History of Present Illness, Review of Systems and Physical Exam were personally obtained by me. Portions of this information were initially documented  by the CMA and reviewed by me for thoroughness and accuracy.  Portions of this note were created using dictation software and may contain typographical errors.   JaMardene SpeakPA-C  [facility] [phone]) [phone])  [place]

## 2021-10-18 DIAGNOSIS — R300 Dysuria: Secondary | ICD-10-CM | POA: Diagnosis not present

## 2021-10-18 DIAGNOSIS — R350 Frequency of micturition: Secondary | ICD-10-CM | POA: Diagnosis not present

## 2021-10-20 ENCOUNTER — Ambulatory Visit: Payer: Medicare HMO

## 2021-10-20 LAB — URINE CULTURE

## 2021-10-20 NOTE — Progress Notes (Signed)
Your urine culture is back and it showed no growth of bacteria. Please, continue to take BP medication it has beneficial properties for kidneys Please, drink a lot of liquids and we could discuss the rest during your fu appt

## 2021-10-25 NOTE — Progress Notes (Unsigned)
Eileen Flores,acting as a Education administrator for [employer], PA-C.,have documented all relevant documentation on the behalf of Eileen Speak, PA-C,as directed by  [employer], PA-C while in the presence of [employer], PA-C.     Established patient visit   Patient: Eileen Flores   DOB: 25-Jan-1936   86 y.o. Female  MRN: [id number] Visit Date: 10/26/2021  Today's healthcare provider: [name], PA-C  CC: BP fu  Subjective    HPI  Hypertension, follow-up  BP Readings from Last 3 Encounters:  10/26/21 (!) 164/83  10/14/21 (!) 144/84  10/09/21 (!) 150/134   Wt Readings from Last 3 Encounters:  10/26/21 192 lb (87.1 kg)  10/14/21 192 lb 12.8 oz (87.5 kg)  10/09/21 197 lb (89.4 kg)     She was last seen for hypertension 10 days ago.  BP at that visit was as above. Management since that visit includes starting Lisinopril 20 mg daily..  She reports fair compliance with treatment. She is not having side effects.   Symptoms: No chest pain No chest pressure  No palpitations No syncope  No dyspnea No orthopnea  No paroxysmal nocturnal dyspnea No lower extremity edema   Pertinent labs Lab Results  Component Value Date   CHOL 205 (H) 12/21/2020   HDL 52 12/21/2020   LDLCALC 130 (H) 12/21/2020   TRIG 130 12/21/2020   CHOLHDL 3.9 12/21/2020   Lab Results  Component Value Date   NA 140 04/20/2021   K 4.2 04/20/2021   CREATININE 0.89 04/20/2021   EGFR 64 04/20/2021   GLUCOSE 89 04/20/2021   TSH 1.210 10/22/2018     The ASCVD Risk score (Arnett DK, et al., 2019) failed to calculate for the following reasons:   The 2019 ASCVD risk score is only valid for ages 59 to 34  ---------------------------------------------------------------------------------------------------   Medications: Outpatient Medications Prior to Visit  Medication Sig   Bioflavonoid Products (VITAMIN C) CHEW Chew by mouth.   Calcium Carb-Cholecalciferol 600-10 MG-MCG CHEW Chew by mouth.    furosemide (LASIX) 20 MG tablet Take 1 tablet (20 mg total) by mouth daily as needed for edema. Recommend PRN use for weight gain/fluid gain in BLE.   levothyroxine (SYNTHROID) 112 MCG tablet Take 1 tablet (112 mcg total) by mouth daily before breakfast.   lisinopril (ZESTRIL) 20 MG tablet TAKE 1 TABLET BY MOUTH EVERY DAY   Masks (SURGICAL FACE MASK/NIOSH N95) MISC Surgical mask for daily use while out in puclic   timolol (TIMOPTIC) 0.5 % ophthalmic solution Place 1 drop into both eyes daily.   No facility-administered medications prior to visit.    Review of Systems  All other systems reviewed and are negative. See HPI      Objective    BP (!) 164/83   Pulse 66   Temp (!) 97.2 F (36.2 C) (Oral)   Wt 192 lb (87.1 kg)   SpO2 98%   BMI 28.35 kg/m    Vitals:   10/26/21 1354 10/26/21 1436  BP: (!) 164/89 (!) 164/83  Pulse: 66   Temp: (!) 97.2 F (36.2 C)   TempSrc: Oral   SpO2: 98%   Weight: 192 lb (87.1 kg)      Physical Exam Vitals reviewed.  Constitutional:      General: She is not in acute distress.    Appearance: Normal appearance. She is well-developed. She is not diaphoretic.  HENT:     Head: Normocephalic and atraumatic.     Nose: Nose normal.  Eyes:     General: No scleral icterus.    Conjunctiva/sclera: Conjunctivae normal.  Neck:     Thyroid: No thyromegaly.  Cardiovascular:     Rate and Rhythm: Normal rate and regular rhythm.     Pulses: Normal pulses.     Heart sounds: Normal heart sounds. No murmur heard. Pulmonary:     Effort: Pulmonary effort is normal. No respiratory distress.     Breath sounds: Normal breath sounds. No wheezing, rhonchi or rales.  Musculoskeletal:     Cervical back: Neck supple.     Right lower leg: No edema.     Left lower leg: No edema.  Lymphadenopathy:     Cervical: No cervical adenopathy.  Skin:    General: Skin is warm and dry.     Findings: No rash.  Neurological:     Mental Status: She is alert and oriented to  person, place, and time. Mental status is at baseline.  Psychiatric:        Behavior: Behavior normal.        Thought Content: Thought content normal.        Judgment: Judgment normal.       No results found for any visits on 10/26/21.  Assessment & Plan     1. Primary hypertension  Chronic and Uncontrolled Adjusted current medications: 20 mg - 40 mg - Discussed DASH diet and dietary sodium restrictions in details Continue/Increase dietary efforts and physical activity.  Encouraged to increase daily activities Advised to find a companion for walking, activities and increase an involvement with local church/ after finding a right one for herself Needs to check PHQ at her next visit to r/o depression Lives with her son Continue BP log  Will check on her on next week Fu as scheduled  The patient was advised to call back or seek an in-person evaluation if the symptoms worsen or if the condition fails to improve as anticipated.  I discussed the assessment and treatment plan with the patient. The patient was provided an opportunity to ask questions and all were answered. The patient agreed with the plan and demonstrated an understanding of the instructions.  The entirety of the information documented in the History of Present Illness, Review of Systems and Physical Exam were personally obtained by me. Portions of this information were initially documented by the CMA and reviewed by me for thoroughness and accuracy.  Portions of this note were created using dictation software and may contain typographical errors.       Eileen Speak, PA-C  [facility] [phone]) [phone] (fax)  [place]

## 2021-10-26 ENCOUNTER — Ambulatory Visit (INDEPENDENT_AMBULATORY_CARE_PROVIDER_SITE_OTHER): Payer: Medicare HMO | Admitting: Physician Assistant

## 2021-10-26 VITALS — BP 164/83 | HR 66 | Temp 97.2°F | Wt 192.0 lb

## 2021-10-26 DIAGNOSIS — I10 Essential (primary) hypertension: Secondary | ICD-10-CM

## 2021-11-03 ENCOUNTER — Ambulatory Visit (INDEPENDENT_AMBULATORY_CARE_PROVIDER_SITE_OTHER): Payer: Medicare HMO

## 2021-11-03 VITALS — Wt 192.0 lb

## 2021-11-03 DIAGNOSIS — Z0000 Encounter for general adult medical examination without abnormal findings: Secondary | ICD-10-CM

## 2021-11-03 NOTE — Patient Instructions (Signed)
[name] , Thank you for taking time to come for your Medicare Wellness Visit. I appreciate your ongoing commitment to your health goals. Please review the following plan we discussed and let me know if I can assist you in the future.   Screening recommendations/referrals: Colonoscopy: aged out Mammogram: aged out Bone Density: 10/08/19 Recommended yearly ophthalmology/optometry visit for glaucoma screening and checkup Recommended yearly dental visit for hygiene and checkup  Vaccinations: Influenza vaccine: 12/21/20 Pneumococcal vaccine: 12/21/20 Tdap vaccine: n/d Shingles vaccine: n/d   Covid-19:n/d  Advanced directives: no  Conditions/risks identified: none  Next appointment: Follow up in one year for your annual wellness visit 11/07/22 @ 10:15 am by phone   Preventive Care 84 Years and Older, Female Preventive care refers to lifestyle choices and visits with your health care provider that can promote health and wellness. What does preventive care include? A yearly physical exam. This is also called an annual well check. Dental exams once or twice a year. Routine eye exams. Ask your health care provider how often you should have your eyes checked. Personal lifestyle choices, including: Daily care of your teeth and gums. Regular physical activity. Eating a healthy diet. Avoiding tobacco and drug use. Limiting alcohol use. Practicing safe sex. Taking low-dose aspirin every day. Taking vitamin and mineral supplements as recommended by your health care provider. What happens during an annual well check? The services and screenings done by your health care provider during your annual well check will depend on your age, overall health, lifestyle risk factors, and family history of disease. Counseling  Your health care provider may ask you questions about your: Alcohol use. Tobacco use. Drug use. Emotional well-being. Home and relationship well-being. Sexual  activity. Eating habits. History of falls. Memory and ability to understand (cognition). Work and work Statistician. Reproductive health. Screening  You may have the following tests or measurements: Height, weight, and BMI. Blood pressure. Lipid and cholesterol levels. These may be checked every 5 years, or more frequently if you are over 85 years old. Skin check. Lung cancer screening. You may have this screening every year starting at age 84 if you have a 30-pack-year history of smoking and currently smoke or have quit within the past 15 years. Fecal occult blood test (FOBT) of the stool. You may have this test every year starting at age 84. Flexible sigmoidoscopy or colonoscopy. You may have a sigmoidoscopy every 5 years or a colonoscopy every 10 years starting at age 84. Hepatitis C blood test. Hepatitis B blood test. Sexually transmitted disease (STD) testing. Diabetes screening. This is done by checking your blood sugar (glucose) after you have not eaten for a while (fasting). You may have this done every 1-3 years. Bone density scan. This is done to screen for osteoporosis. You may have this done starting at age 84. Mammogram. This may be done every 1-2 years. Talk to your health care provider about how often you should have regular mammograms. Talk with your health care provider about your test results, treatment options, and if necessary, the need for more tests. Vaccines  Your health care provider may recommend certain vaccines, such as: Influenza vaccine. This is recommended every year. Tetanus, diphtheria, and acellular pertussis (Tdap, Td) vaccine. You may need a Td booster every 10 years. Zoster vaccine. You may need this after age 84. Pneumococcal 13-valent conjugate (PCV13) vaccine. One dose is recommended after age 70. Pneumococcal polysaccharide (PPSV23) vaccine. One dose is recommended after age 84. Talk to your health care  provider about which screenings and vaccines  you need and how often you need them. This information is not intended to replace advice given to you by your health care provider. Make sure you discuss any questions you have with your health care provider. Document Released: 02/20/2015 Document Revised: 10/14/2015 Document Reviewed: 11/25/2014 Elsevier Interactive Patient Education  2017 [place] Prevention in the Home Falls can cause injuries. They can happen to people of all ages. There are many things you can do to make your home safe and to help prevent falls. What can I do on the outside of my home? Regularly fix the edges of walkways and driveways and fix any cracks. Remove anything that might make you trip as you walk through a door, such as a raised step or threshold. Trim any bushes or trees on the path to your home. Use bright outdoor lighting. Clear any walking paths of anything that might make someone trip, such as rocks or tools. Regularly check to see if handrails are loose or broken. Make sure that both sides of any steps have handrails. Any raised decks and porches should have guardrails on the edges. Have any leaves, snow, or ice cleared regularly. Use sand or salt on walking paths during winter. Clean up any spills in your garage right away. This includes oil or grease spills. What can I do in the bathroom? Use night lights. Install grab bars by the toilet and in the tub and shower. Do not use towel bars as grab bars. Use non-skid mats or decals in the tub or shower. If you need to sit down in the shower, use a plastic, non-slip stool. Keep the floor dry. Clean up any water that spills on the floor as soon as it happens. Remove soap buildup in the tub or shower regularly. Attach bath mats securely with double-sided non-slip rug tape. Do not have throw rugs and other things on the floor that can make you trip. What can I do in the bedroom? Use night lights. Make sure that you have a light by your bed that  is easy to reach. Do not use any sheets or blankets that are too big for your bed. They should not hang down onto the floor. Have a firm chair that has side arms. You can use this for support while you get dressed. Do not have throw rugs and other things on the floor that can make you trip. What can I do in the kitchen? Clean up any spills right away. Avoid walking on wet floors. Keep items that you use a lot in easy-to-reach places. If you need to reach something above you, use a strong step stool that has a grab bar. Keep electrical cords out of the way. Do not use floor polish or wax that makes floors slippery. If you must use wax, use non-skid floor wax. Do not have throw rugs and other things on the floor that can make you trip. What can I do with my stairs? Do not leave any items on the stairs. Make sure that there are handrails on both sides of the stairs and use them. Fix handrails that are broken or loose. Make sure that handrails are as long as the stairways. Check any carpeting to make sure that it is firmly attached to the stairs. Fix any carpet that is loose or worn. Avoid having throw rugs at the top or bottom of the stairs. If you do have throw rugs, attach them to the  floor with carpet tape. Make sure that you have a light switch at the top of the stairs and the bottom of the stairs. If you do not have them, ask someone to add them for you. What else can I do to help prevent falls? Wear shoes that: Do not have high heels. Have rubber bottoms. Are comfortable and fit you well. Are closed at the toe. Do not wear sandals. If you use a stepladder: Make sure that it is fully opened. Do not climb a closed stepladder. Make sure that both sides of the stepladder are locked into place. Ask someone to hold it for you, if possible. Clearly mark and make sure that you can see: Any grab bars or handrails. First and last steps. Where the edge of each step is. Use tools that help you  move around (mobility aids) if they are needed. These include: Canes. Walkers. Scooters. Crutches. Turn on the lights when you go into a dark area. Replace any light bulbs as soon as they burn out. Set up your furniture so you have a clear path. Avoid moving your furniture around. If any of your floors are uneven, fix them. If there are any pets around you, be aware of where they are. Review your medicines with your doctor. Some medicines can make you feel dizzy. This can increase your chance of falling. Ask your doctor what other things that you can do to help prevent falls. This information is not intended to replace advice given to you by your health care provider. Make sure you discuss any questions you have with your health care provider. Document Released: 11/20/2008 Document Revised: 07/02/2015 Document Reviewed: 02/28/2014 Elsevier Interactive Patient Education  2017 [employer].

## 2021-11-03 NOTE — Progress Notes (Signed)
Virtual Visit via Telephone Note  I connected with  Eileen Flores on 11/03/21 at  1:45 PM EDT by telephone and verified that I am speaking with the correct person using two identifiers.  Location: Patient: home Provider: [facility] Persons participating in the virtual visit: [place]   I discussed the limitations, risks, security and privacy concerns of performing an evaluation and management service by telephone and the availability of in person appointments. The patient expressed understanding and agreed to proceed.  Interactive audio and video telecommunications were attempted between this nurse and patient, however failed, due to patient having technical difficulties OR patient did not have access to video capability.  We continued and completed visit with audio only.  Some vital signs may be absent or patient reported.   Dionisio David, LPN  Subjective:   Eileen Flores is a 86 y.o. female who presents for Medicare Annual (Subsequent) preventive examination.  Review of Systems     Cardiac Risk Factors include: advanced age (>63mn, >>28women);hypertension     Objective:    Today's Vitals   11/03/21 1402  Weight: 192 lb (87.1 kg)   Body mass index is 28.35 kg/m.     11/03/2021    1:54 PM 10/09/2021    2:17 PM 11/02/2020    5:14 PM 06/19/2019    1:30 PM 06/18/2018   10:55 AM 05/15/2017    1:42 PM 05/05/2016    1:16 PM  Advanced Directives  Does Patient Have a Medical Advance Directive? No No No No No No No  Would patient like information on creating a medical advance directive? No - Patient declined  No - Patient declined No - Patient declined No - Patient declined No - Patient declined Yes (ED - Information included in AVS)    Current Medications (verified) Outpatient Encounter Medications as of 11/03/2021  Medication Sig   Bioflavonoid Products (VITAMIN C) CHEW Chew by mouth.   Calcium Carb-Cholecalciferol 600-10 MG-MCG CHEW Chew by mouth.    levothyroxine (SYNTHROID) 112 MCG tablet Take 1 tablet (112 mcg total) by mouth daily before breakfast.   lisinopril (ZESTRIL) 20 MG tablet TAKE 1 TABLET BY MOUTH EVERY DAY   Masks (SURGICAL FACE MASK/NIOSH N95) MISC Surgical mask for daily use while out in puclic   timolol (TIMOPTIC) 0.5 % ophthalmic solution Place 1 drop into both eyes daily.   furosemide (LASIX) 20 MG tablet Take 1 tablet (20 mg total) by mouth daily as needed for edema. Recommend PRN use for weight gain/fluid gain in BLE. (Patient not taking: Reported on 11/03/2021)   No facility-administered encounter medications on file as of 11/03/2021.    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   cataract surgery Bilateral 2009   COLONOSCOPY  2011   Dr. Bary Castilla   dermbrasion   1965   OVARIAN CYST REMOVAL  1967   POLYPECTOMY  2006   THYROIDECTOMY  2006   [place]  2009   Family History  Problem Relation Age of Onset   Colon cancer Other    Colon polyps Other    Cancer Mother  54       liver cancer   Cancer Father 5       liver cancer   Alcohol abuse Father    Breast cancer Neg Hx    Social History   Socioeconomic History   Marital status: Widowed    Spouse name: Not on file   Number of children: 2   Years of education: Not on file   Highest education level: Some college, no degree  Occupational History   Not on file  Tobacco Use   Smoking status: Former    Types: Cigarettes   Smokeless tobacco: Never   Tobacco comments:    quit in mid to late 20's  Vaping Use   Vaping Use: Never used  Substance and Sexual Activity   Alcohol use: No    Comment: rare- 1 drink   Drug use: No   Sexual activity: Not on file  Other Topics Concern   Not on file  Social History Narrative   Not on file   Social Determinants of Health   Financial Resource Strain: Low Risk  (11/03/2021)   Overall Financial Resource Strain (CARDIA)    Difficulty of Paying Living Expenses: Not hard at all  Food Insecurity: No Food Insecurity (11/03/2021)   Hunger Vital Sign    Worried About Running Out of Food in the Last Year: Never true    Ran Out of Food in the Last Year: Never true  Transportation Needs: No Transportation Needs (11/03/2021)   PRAPARE - Hydrologist (Medical): No    Lack of Transportation (Non-Medical): No  Physical Activity: Insufficiently Active (11/03/2021)   Exercise Vital Sign    Days of Exercise per Week: 3 days    Minutes of Exercise per Session: 30 min  Stress: No Stress Concern Present (11/03/2021)   [place]    Feeling of Stress : Only a little  Social Connections: Socially Isolated (11/03/2021)   Social Connection and Isolation Panel [NHANES]    Frequency of Communication with Friends and Family: Twice a week    Frequency of Social Gatherings with Friends and Family: Once a week    Attends Religious Services: Never    Marine scientist or  Organizations: No    Attends Archivist Meetings: Never    Marital Status: Widowed    Tobacco Counseling Counseling given: Not Answered Tobacco comments: quit in mid to late 20's   Clinical Intake:  Pre-visit preparation completed: Yes  Pain : No/denies pain     Nutritional Risks: None Diabetes: No  How often do you need to have someone help you when you read instructions, pamphlets, or other written materials from your doctor or pharmacy?: 1 - Never  Diabetic?no  Interpreter Needed?: No  Information  entered by :: Kirke Shaggy, LPN   Activities of Daily Living    11/03/2021    1:55 PM  In your present state of health, do you have any difficulty performing the following activities:  Hearing? 0  Vision? 0  Difficulty concentrating or making decisions? 0  Walking or climbing stairs? 0  Dressing or bathing? 0  Doing errands, shopping? 0  Preparing Food and eating ? N  Using the Toilet? N  In the past six months, have you accidently leaked urine? N  Do you have problems with loss of bowel control? N  Managing your Medications? N  Managing your Finances? N  Housekeeping or managing your Housekeeping? N    Patient Care Team: [name], FNP as PCP - General (Family Medicine) Bary Castilla, Forest Gleason, MD (General Surgery) Leandrew Koyanagi, MD as Referring Physician (Ophthalmology) Margaretha Sheffield, MD (Otolaryngology)  Indicate any recent Medical Services you may have received from other than [facility] providers in the past year (date may be approximate).     Assessment:   This is a routine wellness examination for Eileen Flores.  Hearing/Vision screen Hearing Screening - Comments:: Wears aids Vision Screening - Comments:: Wears glasses- [facility]  Dietary issues and exercise activities discussed: Current Exercise Habits: Home exercise routine, Type of exercise: walking, Time (Minutes): 30, Frequency (Times/Week): 3, Weekly Exercise (Minutes/Week): 90,  Intensity: Mild   Goals Addressed             This Visit's Progress    DIET - EAT MORE FRUITS AND VEGETABLES         Depression Screen    11/03/2021    1:50 PM 11/02/2020    5:09 PM 06/19/2019    1:27 PM 06/18/2018   11:00 AM 06/18/2018   10:55 AM 05/15/2017    1:45 PM 05/15/2017    1:42 PM  PHQ 2/9 Scores  PHQ - 2 Score 1 0 '1 1 1 '$ 0 0  PHQ- 9 Score '3   1  4     '$ Fall Risk    11/03/2021    1:54 PM 11/02/2020    5:14 PM 06/19/2019    1:31 PM 06/18/2018   10:55 AM 03/20/2018    1:11 PM  Fall Risk   Falls in the past year? 0 0 0 1 0  Number falls in past yr: 0 0 0 0   Injury with Fall? 0 0 0 0   Risk for fall due to : No Fall Risks No Fall Risks     Follow up Falls prevention discussed;Falls evaluation completed Falls evaluation completed  Falls prevention discussed     FALL RISK PREVENTION PERTAINING TO THE HOME:  Any stairs in or around the home? Yes  If so, are there any without handrails? No  Home free of loose throw rugs in walkways, pet beds, electrical cords, etc? Yes  Adequate lighting in your home to reduce risk of falls? Yes   ASSISTIVE DEVICES UTILIZED TO PREVENT FALLS:  Life alert? No  Use of a cane, walker or w/c? Yes  Grab bars in the bathroom? No  Shower chair or bench in shower? No  Elevated toilet seat or a handicapped toilet? No    Cognitive Function:        11/03/2021    1:56 PM 11/02/2020    5:16 PM 06/19/2019    1:39 PM 06/18/2018   11:09 AM 05/15/2017    1:47 PM  6CIT Screen  What Year? 0 points 0 points  0 points 0 points 0 points  What month? 0 points 0 points 0 points 0 points 0 points  What time? 0 points 0 points 0 points 0 points 0 points  Count back from 20 0 points 0 points 0 points 0 points 0 points  Months in reverse 0 points 0 points 0 points 0 points 0 points  Repeat phrase 0 points 0 points 0 points 0 points 0 points  Total Score 0 points 0 points 0 points 0 points 0 points    Immunizations Immunization History  Administered  Date(s) Administered   Fluad Quad(high Dose 65+) 10/22/2018, 12/21/2020   Influenza, High Dose Seasonal PF 11/21/2013, 11/05/2016, 11/28/2017   PNEUMOCOCCAL CONJUGATE-20 12/21/2020   Pneumococcal Conjugate-13 07/05/2017   Pneumococcal-Unspecified 01/14/2013    TDAP status: Due, Education has been provided regarding the importance of this vaccine. Advised may receive this vaccine at local pharmacy or Health Dept. Aware to provide a copy of the vaccination record if obtained from local pharmacy or Health Dept. Verbalized acceptance and understanding.  Flu Vaccine status: Up to date  Pneumococcal vaccine status: Up to date  Covid-19 vaccine status: Declined, Education has been provided regarding the importance of this vaccine but patient still declined. Advised may receive this vaccine at local pharmacy or Health Dept.or vaccine clinic. Aware to provide a copy of the vaccination record if obtained from local pharmacy or Health Dept. Verbalized acceptance and understanding.  Qualifies for Shingles Vaccine? Yes   Zostavax completed No   Shingrix Completed?: No.    Education has been provided regarding the importance of this vaccine. Patient has been advised to call insurance company to determine out of pocket expense if they have not yet received this vaccine. Advised may also receive vaccine at local pharmacy or Health Dept. Verbalized acceptance and understanding.  Screening Tests Health Maintenance  Topic Date Due   Zoster Vaccines- Shingrix (1 of 2) Never done   INFLUENZA VACCINE  05/08/2022 (Originally 09/07/2021)   TETANUS/TDAP  02/07/2026 (Originally 09/15/1954)   Pneumonia Vaccine 87+ Years old  Completed   HPV VACCINES  Aged Out   DEXA SCAN  Discontinued   COVID-19 Vaccine  Discontinued    Health Maintenance  Health Maintenance Due  Topic Date Due   Zoster Vaccines- Shingrix (1 of 2) Never done    Colorectal cancer screening: No longer required.   Mammogram status: No longer  required due to age.  Bone Density status: Completed 10/08/19. Results reflect: Bone density results: OSTEOPENIA. Repeat every 5 years.  Lung Cancer Screening: (Low Dose CT Chest recommended if Age 46-80 years, 30 pack-year currently smoking OR have quit w/in 15years.) does not qualify.    Additional Screening:  Hepatitis C Screening: does not qualify; Completed no  Vision Screening: Recommended annual ophthalmology exams for early detection of glaucoma and other disorders of the eye. Is the patient up to date with their annual eye exam?  Yes  Who is the provider or what is the name of the office in which the patient attends annual eye exams? [place] If pt is not established with a provider, would they like to be referred to a provider to establish care? No .   Dental Screening: Recommended annual dental exams for proper oral hygiene  Community Resource Referral / Chronic Care Management: CRR required this visit?  No   CCM required this visit?  No      Plan:     I have personally reviewed and noted the following in  the patient's chart:   Medical and social history Use of alcohol, tobacco or illicit drugs  Current medications and supplements including opioid prescriptions. Patient is not currently taking opioid prescriptions. Functional ability and status Nutritional status Physical activity Advanced directives List of other physicians Hospitalizations, surgeries, and ER visits in previous 12 months Vitals Screenings to include cognitive, depression, and falls Referrals and appointments  In addition, I have reviewed and discussed with patient certain preventive protocols, quality metrics, and best practice recommendations. A written personalized care plan for preventive services as well as general preventive health recommendations were provided to patient.     Dionisio David, LPN   06/11/1362   Nurse Notes: none

## 2021-11-05 ENCOUNTER — Encounter: Payer: Self-pay | Admitting: Physician Assistant

## 2021-11-05 ENCOUNTER — Ambulatory Visit (INDEPENDENT_AMBULATORY_CARE_PROVIDER_SITE_OTHER): Payer: Medicare HMO | Admitting: Physician Assistant

## 2021-11-05 VITALS — BP 138/75 | HR 80 | Resp 16 | Wt 193.0 lb

## 2021-11-05 DIAGNOSIS — R3915 Urgency of urination: Secondary | ICD-10-CM | POA: Diagnosis not present

## 2021-11-05 DIAGNOSIS — R350 Frequency of micturition: Secondary | ICD-10-CM

## 2021-11-05 DIAGNOSIS — N949 Unspecified condition associated with female genital organs and menstrual cycle: Secondary | ICD-10-CM

## 2021-11-05 LAB — POCT URINALYSIS DIPSTICK
Bilirubin, UA: NEGATIVE
Blood, UA: POSITIVE
Glucose, UA: NEGATIVE
Ketones, UA: NEGATIVE
Nitrite, UA: NEGATIVE
Protein, UA: NEGATIVE
Spec Grav, UA: 1.01 (ref 1.010–1.025)
Urobilinogen, UA: 0.2 E.U./dL
pH, UA: 6 (ref 5.0–8.0)

## 2021-11-05 NOTE — Progress Notes (Signed)
Established patient visit  I,[name],acting as a scribe for [employer], PA-C.,have documented all relevant documentation on the behalf of Eileen Speak, PA-C,as directed by  [employer], PA-C while in the presence of [employer], PA-C.   Patient: Eileen Flores   DOB: [birth year]   86 y.o. Female  MRN: [id number] Visit Date: 11/05/2021  Today's healthcare provider: [name], PA-C   Chief Complaint  Patient presents with   Urinary Urgency   Subjective    HPI  Patient has had urinary urgency for about 5 days. Patient also has symptoms of discomfort upon urination. Patient states she has increased her water consumption to help with symptoms, with no relief. Reports having problems with taking lisinopril of '30mg'$  daily/'20mg'$ -'40mg'$ -'20mg'$ ): ear congestion, overall unwell Medications: Outpatient Medications Prior to Visit  Medication Sig   Bioflavonoid Products (VITAMIN C) CHEW Chew by mouth.   Calcium Carb-Cholecalciferol 600-10 MG-MCG CHEW Chew by mouth.   levothyroxine (SYNTHROID) 112 MCG tablet Take 1 tablet (112 mcg total) by mouth daily before breakfast.   lisinopril (ZESTRIL) 20 MG tablet TAKE 1 TABLET BY MOUTH EVERY DAY   timolol (TIMOPTIC) 0.5 % ophthalmic solution Place 1 drop into both eyes daily.   furosemide (LASIX) 20 MG tablet Take 1 tablet (20 mg total) by mouth daily as needed for edema. Recommend PRN use for weight gain/fluid gain in BLE. (Patient not taking: Reported on 11/03/2021)   Masks (SURGICAL FACE MASK/NIOSH N95) MISC Surgical mask for daily use while out in puclic (Patient not taking: Reported on 11/05/2021)   No facility-administered medications prior to visit.    Review of Systems  Constitutional:  Negative for appetite change, chills, fatigue and fever.  Respiratory:  Negative for chest tightness and shortness of breath.   Cardiovascular:  Negative for chest pain and palpitations.  Gastrointestinal:  Negative for abdominal pain, nausea and  vomiting.  Genitourinary:  Positive for difficulty urinating, dysuria and frequency. Negative for vaginal bleeding and vaginal discharge.       A Feeling of heaviness and discomfort/ increased vaginal pressure?  Neurological:  Negative for dizziness and weakness.       Objective    BP 138/75 (BP Location: Right Arm, Patient Position: Sitting, Cuff Size: Large)   Pulse 80   Resp 16   Wt 193 lb (87.5 kg)   SpO2 97%   BMI 28.50 kg/m    Physical Exam Vitals reviewed.  Constitutional:      General: She is not in acute distress.    Appearance: She is well-developed.  HENT:     Head: Normocephalic and atraumatic.  Eyes:     General: No scleral icterus.    Conjunctiva/sclera: Conjunctivae normal.  Cardiovascular:     Rate and Rhythm: Normal rate and regular rhythm.     Heart sounds: Normal heart sounds. No murmur heard. Pulmonary:     Effort: Pulmonary effort is normal. No respiratory distress.     Breath sounds: Normal breath sounds. No wheezing or rales.  Abdominal:     General: There is no distension.     Palpations: Abdomen is soft.     Tenderness: There is abdominal tenderness (discomfort > tenderness). There is no guarding or rebound.  Skin:    General: Skin is warm and dry.     Capillary Refill: Capillary refill takes less than 2 seconds.     Findings: No rash.  Neurological:     Mental Status: She is alert and oriented to  person, place, and time.  Psychiatric:        Behavior: Behavior normal.      No results found for any visits on 11/05/21.  Assessment & Plan     1. Urinary urgency 2. Urinary frequency 3. Vaginal discomfort Hx of recurrent UTIs Last dysuria on 10/14/21 with negative urine culture - POCT urinalysis dipstick pos for leukocytes and blood - Ambulatory referral to Urogynecology - CULTURE, URINE COMPREHENSIVE - Urinalysis, Routine w reflex microscopic  HTN Chronic and Controlled Continue dietary sodium restrictions in  details Continue/Increase dietary efforts and physical activity.  Encouraged to increase daily activities Advised to find a companion for walking, activities and increase an involvement with local church/ after finding a right one for herself Needs to check PHQ at her next visit to r/o depression Lives with her son Continue BP logLisinopril decreased to the '20mg'$  daiily Measure BP log and reassess in a week  Leg swelling Could be due to venous insufficiency Chronic and stable Wo pitting edema/explained significance of "pitting" - Continue with exercising your legs. This helps pump fluid from your legs back to your heart.  Adhere to a low-salt diet, which may reduce fluid buildup and swelling.  Wear support stockings  The patient was advised to call back or seek an in-person evaluation if the symptoms worsen or if the condition fails to improve as anticipated.  I discussed the assessment and treatment plan with the patient. The patient was provided an opportunity to ask questions and all were answered. The patient agreed with the plan and demonstrated an understanding of the instructions.  The entirety of the information documented in the History of Present Illness, Review of Systems and Physical Exam were personally obtained by me. Portions of this information were initially documented by the CMA and reviewed by me for thoroughness and accuracy.  Portions of this note were created using dictation software and may contain typographical errors.   Eileen Speak, PA-C  [facility] [phone] (phone) [phone] (fax)  [place]

## 2021-11-06 ENCOUNTER — Other Ambulatory Visit: Payer: Self-pay | Admitting: Physician Assistant

## 2021-11-06 DIAGNOSIS — N949 Unspecified condition associated with female genital organs and menstrual cycle: Secondary | ICD-10-CM

## 2021-11-06 DIAGNOSIS — R350 Frequency of micturition: Secondary | ICD-10-CM

## 2021-11-06 LAB — URINALYSIS, ROUTINE W REFLEX MICROSCOPIC
Bilirubin, UA: NEGATIVE
Glucose, UA: NEGATIVE
Ketones, UA: NEGATIVE
Leukocytes,UA: NEGATIVE
Nitrite, UA: NEGATIVE
Protein,UA: NEGATIVE
RBC, UA: NEGATIVE
Specific Gravity, UA: 1.013 (ref 1.005–1.030)
Urobilinogen, Ur: 0.2 mg/dL (ref 0.2–1.0)
pH, UA: 5.5 (ref 5.0–7.5)

## 2021-11-06 LAB — SPECIMEN STATUS REPORT

## 2021-11-06 MED ORDER — MIRABEGRON ER 50 MG PO TB24: 50.0000 mg | ORAL_TABLET | Freq: Every day | ORAL | 0 refills | Status: DC

## 2021-11-06 MED ORDER — MIRABEGRON ER 50 MG PO TB24: ORAL_TABLET | ORAL | 0 refills | Status: DC

## 2021-11-06 NOTE — Progress Notes (Signed)
Suspected OAB with moder leukocytes and negative micro. Recommended a trial of mirabegron. Pt was informed and was explained the reasoning. Benefits and risks were discussed. Patient agreed and expressed her understanding.

## 2021-11-09 LAB — CULTURE, URINE COMPREHENSIVE

## 2021-11-10 NOTE — Progress Notes (Signed)
Your recent urine culture results are inconclusive. We might need to repeat it. Please, start to take or continue to take AZO. Please, let me know if how are you doing on a new medication.

## 2021-11-11 DIAGNOSIS — E890 Postprocedural hypothyroidism: Secondary | ICD-10-CM | POA: Diagnosis not present

## 2021-11-12 ENCOUNTER — Ambulatory Visit: Payer: Medicare HMO | Admitting: Physician Assistant

## 2021-11-17 ENCOUNTER — Other Ambulatory Visit: Payer: Self-pay | Admitting: Family Medicine

## 2021-11-17 DIAGNOSIS — E890 Postprocedural hypothyroidism: Secondary | ICD-10-CM | POA: Diagnosis not present

## 2021-11-17 DIAGNOSIS — H6123 Impacted cerumen, bilateral: Secondary | ICD-10-CM | POA: Diagnosis not present

## 2021-11-17 DIAGNOSIS — M7989 Other specified soft tissue disorders: Secondary | ICD-10-CM

## 2021-11-17 NOTE — Telephone Encounter (Signed)
Requested Prescriptions  Pending Prescriptions Disp Refills  . furosemide (LASIX) 20 MG tablet [Pharmacy Med Name: FUROSEMIDE 20 MG TABLET] 90 tablet 0    Sig: TAKE 1 TABLET BY MOUTH EVERY DAY     Cardiovascular:  Diuretics - Loop Failed - 11/17/2021  2:13 AM      Failed - K in normal range and within 180 days    Potassium  Date Value Ref Range Status  04/20/2021 4.2 3.5 - 5.2 mmol/L Final  07/07/2011 3.7 3.5 - 5.1 mmol/L Final         Failed - Ca in normal range and within 180 days    Calcium  Date Value Ref Range Status  04/20/2021 9.2 8.7 - 10.3 mg/dL Final   Calcium, Total  Date Value Ref Range Status  07/07/2011 9.2 8.5 - 10.1 mg/dL Final         Failed - Na in normal range and within 180 days    Sodium  Date Value Ref Range Status  04/20/2021 140 134 - 144 mmol/L Final  07/07/2011 142 136 - 145 mmol/L Final         Failed - Cr in normal range and within 180 days    Creatinine  Date Value Ref Range Status  07/07/2011 0.95 0.60 - 1.30 mg/dL Final   Creatinine, Ser  Date Value Ref Range Status  04/20/2021 0.89 0.57 - 1.00 mg/dL Final         Failed - Cl in normal range and within 180 days    Chloride  Date Value Ref Range Status  04/20/2021 102 96 - 106 mmol/L Final  07/07/2011 107 98 - 107 mmol/L Final         Failed - Mg Level in normal range and within 180 days    No results found for: "MG"       Passed - Last BP in normal range    BP Readings from Last 1 Encounters:  11/05/21 138/75         Passed - Valid encounter within last 6 months    Recent Outpatient Visits          1 week ago Urinary urgency   [facility] [place], [place], PA-C   3 weeks ago Primary hypertension   [employer], [place], PA-C   1 month ago [place], [place], PA-C   1 month ago [facility] [place], [place], PA-C   4 months ago Yeast infection involving the vagina and surrounding area    [facility] [name], FNP      Future Appointments            In 2 days [name], PA-C [employer], [place]   In 1 week Hollice Espy, MD [place]   In 1 month [name], [place], [facility]

## 2021-11-19 ENCOUNTER — Ambulatory Visit (INDEPENDENT_AMBULATORY_CARE_PROVIDER_SITE_OTHER): Payer: Medicare HMO | Admitting: Physician Assistant

## 2021-11-19 VITALS — BP 138/69 | HR 62 | Temp 98.1°F | Wt 192.0 lb

## 2021-11-19 DIAGNOSIS — R3915 Urgency of urination: Secondary | ICD-10-CM | POA: Diagnosis not present

## 2021-11-19 DIAGNOSIS — N949 Unspecified condition associated with female genital organs and menstrual cycle: Secondary | ICD-10-CM | POA: Diagnosis not present

## 2021-11-19 DIAGNOSIS — R350 Frequency of micturition: Secondary | ICD-10-CM | POA: Diagnosis not present

## 2021-11-19 DIAGNOSIS — M7989 Other specified soft tissue disorders: Secondary | ICD-10-CM

## 2021-11-19 DIAGNOSIS — I10 Essential (primary) hypertension: Secondary | ICD-10-CM

## 2021-11-19 LAB — POCT URINALYSIS DIPSTICK
Bilirubin, UA: NEGATIVE
Blood, UA: NEGATIVE
Glucose, UA: NEGATIVE
Ketones, UA: NEGATIVE
Leukocytes, UA: NEGATIVE
Nitrite, UA: NEGATIVE
Protein, UA: NEGATIVE
Spec Grav, UA: <=1.005 — AB (ref 1.010–1.025)
Urobilinogen, UA: 0.2 E.U./dL
pH, UA: 6 (ref 5.0–8.0)

## 2021-11-19 NOTE — Progress Notes (Unsigned)
   [place],acting as a Education administrator for [employer], PA-C.,have documented all relevant documentation on the behalf of [name], PA-C,as directed by  [employer], PA-C while in the presence of [employer], PA-C.     Established patient visit   Patient: Eileen Flores   DOB: July 09, 1935   86 y.o. Female  MRN: [id number] Visit Date: 11/19/2021  Today's healthcare provider: [name], PA-C   No chief complaint on file.  Subjective    HPI  Patient is an 86 year old female who presents for follow up of over active bladder.  Patient was given Myrbetriq and states that she thinks it did help.  She states she still urinated as much but felt she had more control.  She took the last one last week and has none left.  She has appointment with urology next week.  Medications: Outpatient Medications Prior to Visit  Medication Sig   Bioflavonoid Products (VITAMIN C) CHEW Chew by mouth.   Calcium Carb-Cholecalciferol 600-10 MG-MCG CHEW Chew by mouth.   furosemide (LASIX) 20 MG tablet TAKE 1 TABLET BY MOUTH EVERY DAY   levothyroxine (SYNTHROID) 112 MCG tablet Take 1 tablet (112 mcg total) by mouth daily before breakfast.   lisinopril (ZESTRIL) 20 MG tablet TAKE 1 TABLET BY MOUTH EVERY DAY   Masks (SURGICAL FACE MASK/NIOSH N95) MISC Surgical mask for daily use while out in puclic   mirabegron ER (MYRBETRIQ) 50 MG TB24 tablet Take 1/2 tablet daily with or without food.   timolol (TIMOPTIC) 0.5 % ophthalmic solution Place 1 drop into both eyes daily.   No facility-administered medications prior to visit.    Review of Systems  Genitourinary:  Positive for frequency and urgency. Negative for difficulty urinating, dysuria, enuresis and hematuria.    {Labs  Heme  Chem  Endocrine  Serology  Results Review (optional):23779}   Objective    BP 138/69 (BP Location: Right Arm, Patient Position: Sitting, Cuff Size: Normal)   Pulse 62   Temp 98.1 F (36.7 C) (Oral)   Wt 192 lb (87.1  kg)   SpO2 100%   BMI 28.35 kg/m  {Show previous vital signs (optional):23777}  Physical Exam  ***  No results found for any visits on 11/19/21.  Assessment & Plan     ***  No follow-ups on file.      {provider attestation***:1}   [name], Hershal Coria  [facility] [phone] (phone) [phone] (fax)  [place]

## 2021-11-24 ENCOUNTER — Ambulatory Visit: Payer: Medicare HMO | Admitting: Urology

## 2021-11-24 DIAGNOSIS — R350 Frequency of micturition: Secondary | ICD-10-CM

## 2021-11-26 ENCOUNTER — Other Ambulatory Visit
Admission: RE | Admit: 2021-11-26 | Discharge: 2021-11-26 | Disposition: A | Discharge status: Home / Self Care | Payer: Medicare HMO | Attending: Urology | Admitting: Urology

## 2021-11-26 ENCOUNTER — Inpatient Hospital Stay: Admit: 2021-11-26 | Payer: Medicare HMO

## 2021-11-26 ENCOUNTER — Encounter: Payer: Self-pay | Admitting: Urology

## 2021-11-26 ENCOUNTER — Ambulatory Visit: Payer: Medicare HMO | Admitting: Urology

## 2021-11-26 VITALS — BP 148/77 | HR 68 | Ht 69.0 in | Wt 193.0 lb

## 2021-11-26 DIAGNOSIS — R350 Frequency of micturition: Secondary | ICD-10-CM | POA: Insufficient documentation

## 2021-11-26 DIAGNOSIS — B3731 Acute candidiasis of vulva and vagina: Secondary | ICD-10-CM | POA: Diagnosis not present

## 2021-11-26 DIAGNOSIS — N3941 Urge incontinence: Secondary | ICD-10-CM

## 2021-11-26 DIAGNOSIS — N3946 Mixed incontinence: Secondary | ICD-10-CM

## 2021-11-26 DIAGNOSIS — N390 Urinary tract infection, site not specified: Secondary | ICD-10-CM

## 2021-11-26 DIAGNOSIS — Z87440 Personal history of urinary (tract) infections: Secondary | ICD-10-CM | POA: Diagnosis not present

## 2021-11-26 DIAGNOSIS — R300 Dysuria: Secondary | ICD-10-CM | POA: Insufficient documentation

## 2021-11-26 LAB — URINALYSIS, ROUTINE W REFLEX MICROSCOPIC
Bilirubin Urine: NEGATIVE
Glucose, UA: NEGATIVE mg/dL
Ketones, ur: NEGATIVE mg/dL
Nitrite: NEGATIVE
Protein, ur: NEGATIVE mg/dL
Specific Gravity, Urine: 1.01 (ref 1.005–1.030)
pH: 5 (ref 5.0–8.0)

## 2021-11-26 LAB — URINALYSIS, MICROSCOPIC (REFLEX)

## 2021-11-26 LAB — BLADDER SCAN AMB NON-IMAGING

## 2021-11-26 MED ORDER — PREMARIN 0.625 MG/GM VA CREA: 1.0000 | TOPICAL_CREAM | Freq: Every day | VAGINAL | 12 refills | Status: DC

## 2021-11-26 MED ORDER — MIRABEGRON ER 25 MG PO TB24: 25.0000 mg | ORAL_TABLET | Freq: Every day | ORAL | 3 refills | Status: DC

## 2021-11-26 NOTE — Progress Notes (Signed)
11/26/2021 2:08 PM   Eileen Flores 1935-07-13 [id number]  Referring provider: [name], PA-C [address] [place],  [place] [address]  No chief complaint on file.   HPI:   She initially presented to her PA on 8/23 with concern for urinary tract infection.  At the time she was having dysuria.  Urine dipstick showed 2+ leukocytes, no microscopic exam or urine culture was performed.  She was prescribed Keflex.  She was seen and evaluated at urgent care on 10/10/2019 after having completed a dose of Keflex for UTI started have recurrent symptoms include dysuria and flank pain.  Home test was positive and that she presented to the emergency room.  Urinalysis on this occasion appeared to be contaminated with 6-10 squamous epithelial cells, 11-30 white blood cells and 6-10 red blood cells.  Urine culture grew mixed flora.  She was treated with nitrofurantoin and fluconazole.  She had a Medicare wellness exam on 11/03/2021 which showed a urine was checked again.  Urinalysis on this occasion was negative and the culture also was negative.  At this point in time, she was diagnosed with OAB given a trial of Myrbetriq.  Most recent upper tract imaging in the form of MRI of the abdomen on 04/2021 showed some scarring in the left lower pole, otherwise normal kidneys.   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   cataract surgery Bilateral 2009   COLONOSCOPY  2011   Dr. Bary Castilla   dermbrasion   1965   OVARIAN CYST REMOVAL  1967   POLYPECTOMY  2006   THYROIDECTOMY  2006   TONGUE SURGERY  2009    Home Medications:  Allergies as of 11/26/2021       Reactions   Penicillin G Rash   Penicillins Rash        Medication List        Accurate as of November 26, 2021  2:08 PM. If you have any questions, ask your nurse or doctor.          Calcium Carb-Cholecalciferol 600-10 MG-MCG Chew Chew by mouth.   furosemide 20 MG tablet Commonly known as: LASIX TAKE 1 TABLET BY MOUTH EVERY DAY   levothyroxine 112 MCG tablet Commonly known as: SYNTHROID Take 1 tablet (112 mcg total) by mouth daily before breakfast.   lisinopril 20 MG tablet Commonly known as: ZESTRIL TAKE 1 TABLET BY MOUTH EVERY DAY   mirabegron ER 50 MG Tb24 tablet Commonly  known as: MYRBETRIQ Take 1/2 tablet daily with or without food.   Surgical Face Mask/NIOSH N95 Misc Surgical mask for daily use while out in puclic   timolol 0.5 % ophthalmic solution Commonly known as: TIMOPTIC Place 1 drop into both eyes daily.   Vitamin C Chew Chew by mouth.        Allergies:  Allergies  Allergen Reactions   Penicillin G Rash   Penicillins Rash    Family History: Family History  Problem Relation Age of Onset   Colon cancer Other    Colon polyps Other    Cancer Mother 67       liver cancer   Cancer Father 26       liver cancer   Alcohol abuse Father    Breast cancer Neg Hx     Social History:  reports that she has quit smoking. Her smoking use included cigarettes. She has  never used smokeless tobacco. She reports that she does not drink alcohol and does not use drugs.   Physical Exam: There were no vitals taken for this visit.  Constitutional:  Alert and oriented, No acute distress. HEENT: [place] AT, moist mucus membranes.  Trachea midline, no masses. Cardiovascular: No clubbing, cyanosis, or edema. Respiratory: Normal respiratory effort, no increased work of breathing. GI: Abdomen is soft, nontender, nondistended, no abdominal masses GU: No CVA tenderness Skin: No rashes, bruises or suspicious lesions. Neurologic: Grossly intact, no focal deficits, moving all 4 extremities. Psychiatric: Normal mood and affect.  Laboratory Data: Lab Results  Component Value Date   WBC 9.7 04/20/2021   HGB 14.3 04/20/2021   HCT 42.9 04/20/2021   MCV 92 04/20/2021   PLT 329 04/20/2021    Lab Results  Component Value Date   CREATININE 0.89 04/20/2021    No results found for: "PSA"  No results found for: "TESTOSTERONE"  Lab Results  Component Value Date   HGBA1C 5.6 10/22/2018    Urinalysis    Component Value Date/Time   COLORURINE YELLOW 10/09/2021 1420   APPEARANCEUR Clear 11/05/2021 0000   LABSPEC 1.015 10/09/2021 1420   PHURINE 5.5 10/09/2021 1420   GLUCOSEU Negative 11/05/2021 0000   HGBUR SMALL (A) 10/09/2021 1420   BILIRUBINUR neg 11/19/2021 1509   BILIRUBINUR Negative 11/05/2021 0000   KETONESUR NEGATIVE 10/09/2021 1420   PROTEINUR Negative 11/19/2021 1509   PROTEINUR Negative 11/05/2021 0000   PROTEINUR NEGATIVE 10/09/2021 1420   UROBILINOGEN 0.2 11/19/2021 1509   NITRITE neg 11/19/2021 1509   NITRITE Negative 11/05/2021 0000   NITRITE NEGATIVE 10/09/2021 1420   LEUKOCYTESUR Negative 11/19/2021 1509   LEUKOCYTESUR Negative 11/05/2021 0000   LEUKOCYTESUR MODERATE (A) 10/09/2021 1420    Lab Results  Component Value Date   LABMICR Comment 11/05/2021   WBCUA 0-5 06/14/2017   RBCUA 0-2 06/14/2017   LABEPIT 0-10 06/14/2017   MUCUS  Present 06/14/2017   BACTERIA FEW (A) 10/09/2021    Pertinent Imaging: *** No results found for this or any previous visit.  No results found for this or any previous visit.  No results found for this or any previous visit.  No results found for this or any previous visit.  No results found for this or any previous visit.  No valid procedures specified. No results found for this or any previous visit.  Results for orders placed during the hospital encounter of 11/24/20  CT Renal Stone Study  Narrative CLINICAL DATA:  Back pain  EXAM: CT ABDOMEN AND PELVIS WITHOUT CONTRAST  TECHNIQUE: Multidetector CT imaging of the abdomen and pelvis was performed following the standard protocol without IV contrast.  COMPARISON:  None.  FINDINGS: Lower chest: Lung bases demonstrate no acute consolidation or pleural effusion. Mild subpleural reticulation. Small right anterior lung base pulmonary nodules measuring up to 5 mm in size. Moderate hiatal hernia  Hepatobiliary: No focal liver abnormality is seen. No gallstones, gallbladder wall thickening, or biliary dilatation.  Pancreas: Unremarkable. No pancreatic ductal dilatation or surrounding inflammatory changes.  Spleen: Normal in size without focal abnormality.  Adrenals/Urinary Tract: Adrenal glands are normal. Parapelvic cysts on the left. No hydronephrosis or ureteral stone. The bladder is normal  Stomach/Bowel: Stomach is within normal limits. Appendix appears normal. No evidence of bowel wall thickening, distention, or inflammatory changes. Diverticular disease of the left colon without acute wall thickening.  Vascular/Lymphatic: Mild aortic atherosclerosis. No aneurysm. No suspicious nodes  Reproductive: Uterus and bilateral adnexa are unremarkable.  Other: Negative for pelvic effusion or free air. Small fat containing periumbilical hernia.  Musculoskeletal: Scoliosis and multilevel degenerative changes  of the spine. No acute osseous abnormality.  IMPRESSION: 1. Negative for hydronephrosis or ureteral stone. No CT evidence for acute intra-abdominal or pelvic abnormality. 2. Small pulmonary nodules in the right lung base. No follow-up needed if patient is low-risk (and has no known or suspected primary neoplasm). Non-contrast chest CT can be considered in 12 months if patient is high-risk. This recommendation follows the consensus statement: Guidelines for Management of Incidental Pulmonary Nodules Detected on CT Images: From the Fleischner Society 2017; Radiology 2017; 284:228-243. 3. Diverticular disease of the colon without acute wall thickening.   Electronically Signed By: Donavan Foil M.D. On: 11/24/2020 20:19   Assessment & Plan:    There are no diagnoses linked to this encounter.  No follow-ups on file.  Hollice Espy, MD  [facility] [address], [place], [place] [address] [phone]

## 2021-12-02 ENCOUNTER — Emergency Department
Admission: EM | Admit: 2021-12-02 | Discharge: 2021-12-02 | Disposition: A | Discharge status: Home / Self Care | Payer: Medicare HMO | Attending: Emergency Medicine | Admitting: Emergency Medicine

## 2021-12-02 ENCOUNTER — Other Ambulatory Visit: Payer: Self-pay

## 2021-12-02 DIAGNOSIS — R1084 Generalized abdominal pain: Secondary | ICD-10-CM | POA: Diagnosis not present

## 2021-12-02 DIAGNOSIS — I10 Essential (primary) hypertension: Secondary | ICD-10-CM | POA: Diagnosis not present

## 2021-12-02 DIAGNOSIS — K529 Noninfective gastroenteritis and colitis, unspecified: Secondary | ICD-10-CM | POA: Diagnosis not present

## 2021-12-02 DIAGNOSIS — E039 Hypothyroidism, unspecified: Secondary | ICD-10-CM | POA: Diagnosis not present

## 2021-12-02 DIAGNOSIS — R197 Diarrhea, unspecified: Secondary | ICD-10-CM

## 2021-12-02 DIAGNOSIS — R42 Dizziness and giddiness: Secondary | ICD-10-CM | POA: Diagnosis not present

## 2021-12-02 DIAGNOSIS — R112 Nausea with vomiting, unspecified: Secondary | ICD-10-CM

## 2021-12-02 DIAGNOSIS — Z853 Personal history of malignant neoplasm of breast: Secondary | ICD-10-CM | POA: Diagnosis not present

## 2021-12-02 DIAGNOSIS — Z743 Need for continuous supervision: Secondary | ICD-10-CM | POA: Diagnosis not present

## 2021-12-02 DIAGNOSIS — R109 Unspecified abdominal pain: Secondary | ICD-10-CM | POA: Diagnosis not present

## 2021-12-02 DIAGNOSIS — R55 Syncope and collapse: Secondary | ICD-10-CM | POA: Diagnosis not present

## 2021-12-02 DIAGNOSIS — R100 Acute abdomen: Secondary | ICD-10-CM | POA: Diagnosis not present

## 2021-12-02 LAB — COMPREHENSIVE METABOLIC PANEL
ALT: 14 U/L (ref 0–44)
AST: 27 U/L (ref 15–41)
Albumin: 3.8 g/dL (ref 3.5–5.0)
Alkaline Phosphatase: 78 U/L (ref 38–126)
Anion gap: 9 (ref 5–15)
BUN: 28 mg/dL — ABNORMAL HIGH (ref 8–23)
CO2: 28 mmol/L (ref 22–32)
Calcium: 9.5 mg/dL (ref 8.9–10.3)
Chloride: 102 mmol/L (ref 98–111)
Creatinine, Ser: 1.18 mg/dL — ABNORMAL HIGH (ref 0.44–1.00)
GFR, Estimated: 46 mL/min — ABNORMAL LOW (ref >60)
Glucose, Bld: 110 mg/dL — ABNORMAL HIGH (ref 70–99)
Potassium: 4.8 mmol/L (ref 3.5–5.1)
Sodium: 139 mmol/L (ref 135–145)
Total Bilirubin: 0.6 mg/dL (ref 0.3–1.2)
Total Protein: 7.4 g/dL (ref 6.5–8.1)

## 2021-12-02 LAB — C DIFFICILE QUICK SCREEN W PCR REFLEX
C Diff antigen: NEGATIVE
C Diff interpretation: NOT DETECTED
C Diff toxin: NEGATIVE

## 2021-12-02 LAB — CBC
HCT: 46.1 % — ABNORMAL HIGH (ref 36.0–46.0)
Hemoglobin: 14.8 g/dL (ref 12.0–15.0)
MCH: 30.8 pg (ref 26.0–34.0)
MCHC: 32.1 g/dL (ref 30.0–36.0)
MCV: 95.8 fL (ref 80.0–100.0)
Platelets: 303 10*3/uL (ref 150–400)
RBC: 4.81 MIL/uL (ref 3.87–5.11)
RDW: 13.3 % (ref 11.5–15.5)
WBC: 12.9 10*3/uL — ABNORMAL HIGH (ref 4.0–10.5)
nRBC: 0 % (ref 0.0–0.2)

## 2021-12-02 LAB — LIPASE, BLOOD: Lipase: 36 U/L (ref 11–51)

## 2021-12-02 MED ORDER — ACETAMINOPHEN 10 MG/ML IV SOLN
1000.0000 mg | Freq: Once | INTRAVENOUS | Status: DC
  Filled 2021-12-02: qty 100

## 2021-12-02 MED ORDER — SODIUM CHLORIDE 0.9 % IV BOLUS
1000.0000 mL | Freq: Once | INTRAVENOUS | Status: AC
  Administered 2021-12-02: 1000 mL via INTRAVENOUS

## 2021-12-02 MED ORDER — ONDANSETRON HCL 4 MG/2ML IJ SOLN
4.0000 mg | Freq: Once | INTRAMUSCULAR | Status: AC
  Administered 2021-12-02: 4 mg via INTRAVENOUS
  Filled 2021-12-02: qty 2

## 2021-12-02 NOTE — ED Provider Notes (Signed)
[facility] Provider Note    Event Date/Time   First MD Initiated Contact with Patient 12/02/21 1618     (approximate)   History   Abdominal Pain, Emesis, and Diarrhea   HPI  Eileen Flores is a 86 y.o. female with a past medical history of chronic low back pain, hypertension, GERD, who presents today for evaluation of abdominal pain, nausea, vomiting, diarrhea which began approximately 30 minutes after eating at [employer].  Patient reports that she felt very normal prior to this.  She reports that she has low abdominal cramping and has had multiple episodes of loose stools.  She denies any blood in her stool.  She reports that she was with her son who did not eat the same thing as her.  She denies chest pain, shortness of breath, or back pain.  Patient Active Problem List   Diagnosis Date Noted   Dysuria 09/29/2021   Cyst of kidney, acquired 07/01/2021   Other abnormal findings in urine 07/01/2021   Renal scarring 07/01/2021   Gastroesophageal reflux disease 07/01/2021   Vaginal itching 06/25/2021   Yeast infection involving the vagina and surrounding area 06/25/2021   Leg swelling 05/11/2021   Low back pain 04/26/2021   Chronic bilateral low back pain without sciatica 04/06/2021   Urinary frequency 04/06/2021   Use of cane as ambulatory aid 04/06/2021   Other specified disorders of kidney and ureter 04/06/2021   Lung nodule, multiple 04/06/2021   Breast calcification, left 04/06/2021   Mixed stress and urge urinary incontinence 01/18/2021   Primary hypertension 01/18/2021   Risk for falls 01/18/2021   Need for shingles vaccine 01/18/2021   Vitamin D deficiency 12/21/2020   Hyperlipidemia 06/14/2017   GERD (gastroesophageal reflux disease) 05/05/2016   Anxiety 11/16/2015   Arthritis 11/16/2015   Bunion 11/16/2015   Bursitis 11/16/2015   Colon polyp 11/16/2015   DD (diverticular disease) 11/16/2015   Hammer toe 11/16/2015   Acquired  hypothyroidism 11/16/2015   Tendinitis 11/16/2015   History of breast cancer 02/21/2013   DCIS (ductal carcinoma in situ) of breast 01/17/2013   Papilloma of breast 01/17/2013   Breast neoplasm, Tis (DCIS), left 01/07/2013          Physical Exam   Triage Vital Signs: ED Triage Vitals [12/02/21 1602]  Enc Vitals Group     BP (!) 129/58     Pulse Rate 62     Resp 20     Temp 97.8 F (36.6 C)     Temp Source Oral     SpO2 96 %     Weight 192 lb 10.9 oz (87.4 kg)     Height '5\' 9"'$  (1.753 m)     Head Circumference      Peak Flow      Pain Score      Pain Loc      Pain Edu?      Excl. in [place]?     Most recent vital signs: Vitals:   12/02/21 1630 12/02/21 1730  BP: (!) 133/56 (!) 144/59  Pulse: (!) 59 66  Resp: 19 20  Temp:    SpO2: 96% 95%    Physical Exam Vitals and nursing note reviewed.  Constitutional:      General: Awake and alert. No acute distress.    Appearance: Normal appearance. The patient is normal weight.  HENT:     Head: Normocephalic and atraumatic.     Mouth: Mucous membranes are moist.  Eyes:     General: PERRL. Normal EOMs        Right eye: No discharge.        Left eye: No discharge.     Conjunctiva/sclera: Conjunctivae normal.  Cardiovascular:     Rate and Rhythm: Normal rate and regular rhythm.     Pulses: Normal pulses.     Heart sounds: Normal heart sounds Pulmonary:     Effort: Pulmonary effort is normal. No respiratory distress.     Breath sounds: Normal breath sounds.  Abdominal:     Abdomen is soft. There is no abdominal tenderness. No rebound or guarding. No distention. Musculoskeletal:        General: No swelling. Normal range of motion.     Cervical back: Normal range of motion and neck supple.  Skin:    General: Skin is warm and dry.     Capillary Refill: Capillary refill takes less than 2 seconds.     Findings: No rash.  Neurological:     Mental Status: The patient is awake and alert.      ED Results / Procedures /  Treatments   Labs (all labs ordered are listed, but only abnormal results are displayed) Labs Reviewed  COMPREHENSIVE METABOLIC PANEL - Abnormal; Notable for the following components:      Result Value   Glucose, Bld 110 (*)    BUN 28 (*)    Creatinine, Ser 1.18 (*)    GFR, Estimated 46 (*)    All other components within normal limits  CBC - Abnormal; Notable for the following components:   WBC 12.9 (*)    HCT 46.1 (*)    All other components within normal limits  C DIFFICILE QUICK SCREEN W PCR REFLEX    LIPASE, BLOOD  URINALYSIS, ROUTINE W REFLEX MICROSCOPIC     EKG     RADIOLOGY     PROCEDURES:  Critical Care performed:   Procedures   MEDICATIONS ORDERED IN ED: Medications  acetaminophen (OFIRMEV) IV 1,000 mg (1,000 mg Intravenous Patient Refused/Not Given 12/02/21 1644)  ondansetron (ZOFRAN) injection 4 mg (4 mg Intravenous Given 12/02/21 1644)  sodium chloride 0.9 % bolus 1,000 mL (0 mLs Intravenous Stopped 12/02/21 1807)     IMPRESSION / MDM / ASSESSMENT AND PLAN / ED COURSE  I reviewed the triage vital signs and the nursing notes.   Differential diagnosis includes, but is not limited to, gastroenteritis, C. difficile, less likely diverticulitis or appendicitis.  Patient is awake and alert, hemodynamically stable and afebrile.  Room smells strongly of diarrhea.  Her abdomen is nontender throughout.  Her diarrhea is nonbloody.  Her labs are overall reassuring.  She was hydrated and given Zofran and Tylenol with improvement of her symptoms.  Serial abdominal exams were performed and patient had no reproducible tenderness, no rebound or guarding.  Not consistent with appendicitis or diverticulitis, not consistent with surgical abdomen.  I do not feel that she needs advanced imaging at this time, and she would rather not have CT. She is afebrile.  Given that she was recently on antibiotics for urinary tract infection, C. difficile was tested and this was negative.   Discussed these findings with the patient and I recommended bland food and hydration at home with small and frequent sips of water while her diarrhea persist.  We discussed expected timeframe for improvement.  Patient feels comfortable with discharge home at this time.  Her son at bedside also feels comfortable with discharge home.  Patient  was discharged in stable condition.     Patient's presentation is most consistent with acute complicated illness / injury requiring diagnostic workup.   Clinical Course as of 12/02/21 1847  Thu Dec 02, 2021  1748 Patient reassessed.  Abdominal tenderness has resolved.  She reports that she feels improved.  Son at bedside [JP]    Clinical Course User Index [JP] Anik Wesch, Clarnce Flock, PA-C     FINAL CLINICAL IMPRESSION(S) / ED DIAGNOSES   Final diagnoses:  Gastroenteritis  Nausea vomiting and diarrhea     Rx / DC Orders   ED Discharge Orders     None        Note:  This document was prepared using Dragon voice recognition software and may include unintentional dictation errors.   Marquette Old, PA-C 12/02/21 1847    Naaman Plummer, MD 12/02/21 [phone]

## 2021-12-02 NOTE — Discharge Instructions (Signed)
Eat bland foods and take small sips of water.  Please return if you develop abdominal pain, dizziness, blood in your stool,, or any other concerns.  It was a pleasure caring for you today.

## 2021-12-02 NOTE — ED Triage Notes (Signed)
Pt arrives via [facility] with complaints of uncontrolled vomiting and diarrhea. Pt states she ate at [employer], after leaving the restaurant she started to experiencing severe abdominal pain, diarrhea and vomiting. Pt also states she started taking a new bladder medication on Sunday.

## 2021-12-03 ENCOUNTER — Ambulatory Visit: Payer: Self-pay | Admitting: *Deleted

## 2021-12-03 NOTE — Telephone Encounter (Signed)
Reason for Disposition  MILD rectal bleeding (more than just a few drops or streaks)  Answer Assessment - Initial Assessment Questions 1. APPEARANCE of BLOOD: "What color is it?" "Is it passed separately, on the surface of the stool, or mixed in with the stool?"      separate 2. AMOUNT: "How much blood was passed?"      Slight increased- water pink/red 3. FREQUENCY: "How many times has blood been passed with the stools?"      every time-3-4 times 4. ONSET: "When was the blood first seen in the stools?" (Days or weeks)      Started early this am 5. DIARRHEA: "Is there also some diarrhea?" If Yes, ask: "How many diarrhea stools in the past 24 hours?"      Yes- recently released from hospital- treated for gastritis  6. CONSTIPATION: "Do you have constipation?" If Yes, ask: "How bad is it?"      N/a 7. RECURRENT SYMPTOMS: "Have you had blood in your stools before?" If Yes, ask: "When was the last time?" and "What happened that time?"      Hx hemorrhoids  8. BLOOD THINNERS: "Do you take any blood thinners?" (e.g., Coumadin/warfarin, Pradaxa/dabigatran, aspirin)     no 9. OTHER SYMPTOMS: "Do you have any other symptoms?"  (e.g., abdomen pain, vomiting, dizziness, fever)     Stomach is recover form ED yesterday  Protocols used: Rectal Bleeding-A-AH

## 2021-12-03 NOTE — Telephone Encounter (Signed)
  Chief Complaint: rectal bleeding after diarrhea Symptoms: small amount of rectal bleeding Frequency: stated today Pertinent Negatives: Patient denies abdomen pain, vomiting, dizziness, fever Disposition: '[]'$ ED /'[]'$ Urgent Care (no appt availability in office) / '[x]'$ Appointment(In office/virtual)/ '[]'$  [facility]/ '[]'$ Home Care/ '[]'$ Refused Recommended Disposition /'[]'$ [facility]/ '[]'$  Follow-up with PCP Additional Notes: Patient was just released from ED with diarrhea/abdominal discomfort- she has had her hemorrhoids flare. Patient advised home care advised, appointment has been scheduled, and advised to seek care with increasing symptoms.

## 2021-12-06 ENCOUNTER — Encounter: Payer: Self-pay | Admitting: Family Medicine

## 2021-12-06 ENCOUNTER — Ambulatory Visit (INDEPENDENT_AMBULATORY_CARE_PROVIDER_SITE_OTHER): Payer: Medicare HMO | Admitting: Family Medicine

## 2021-12-06 VITALS — BP 136/61 | HR 74 | Resp 16 | Ht 68.0 in | Wt 191.0 lb

## 2021-12-06 DIAGNOSIS — K529 Noninfective gastroenteritis and colitis, unspecified: Secondary | ICD-10-CM

## 2021-12-06 NOTE — Assessment & Plan Note (Signed)
Acute, improved Complaints of hemorrhoid exacerbation s/s frequent BM movements Repeat CBC and BMP given previously mildly decreased kidney function s/s dehydration and elevated WBC. Reports appetite is coming back slowly; denies weakness or fatigue

## 2021-12-06 NOTE — Progress Notes (Signed)
Established patient visit   Patient: Eileen Flores   DOB: 01-Nov-1935   86 y.o. Female  MRN: [id number] Visit Date: 12/06/2021  Today's healthcare provider: [name], FNP  Re Introduced to nurse practitioner role and practice setting.  All questions answered.  Discussed provider/patient relationship and expectations.  I,Tiffany J Bragg,acting as a scribe for [name], FNP.,have documented all relevant documentation on the behalf of [name], FNP,as directed by  [name], FNP while in the presence of [name], FNP.   Chief Complaint  Patient presents with   Follow-up   Subjective    HPI  Follow up ER visit  Patient was seen in ER for gastroenteritis on 12/02/21. She was treated for nausea vomiting diarrhea. Treatment for this included EKG, labs, fluids. She reports excellent compliance with treatment. She reports this condition is Improved.  -----------------------------------------------------------------------------------------   Medications: Outpatient Medications Prior to Visit  Medication Sig   Calcium Carb-Cholecalciferol 600-10 MG-MCG CHEW Chew by mouth.   conjugated estrogens (PREMARIN) vaginal cream Place 1 Applicatorful vaginally daily. Use pea sized amount M-W-Fr before bedtime   Cranberry-Vitamin C-Probiotic (AZO CRANBERRY PO) Take by mouth.   levothyroxine (SYNTHROID) 112 MCG tablet Take 1 tablet (112 mcg total) by mouth daily before breakfast.   lisinopril (ZESTRIL) 20 MG tablet TAKE 1 TABLET BY MOUTH EVERY DAY   timolol (TIMOPTIC) 0.5 % ophthalmic solution Place 1 drop into both eyes daily.   mirabegron ER (MYRBETRIQ) 25 MG TB24 tablet Take 1 tablet (25 mg total) by mouth daily. (Patient not taking: Reported on 12/06/2021)   No facility-administered medications prior to visit.    Review of Systems  Last CBC Lab Results  Component Value Date   WBC 12.9 (H) 12/02/2021   HGB 14.8 12/02/2021   HCT 46.1 (H) 12/02/2021   MCV  95.8 12/02/2021   MCH 30.8 12/02/2021   RDW 13.3 12/02/2021   PLT 303 20/94/7096   Last metabolic panel Lab Results  Component Value Date   GLUCOSE 110 (H) 12/02/2021   NA 139 12/02/2021   K 4.8 12/02/2021   CL 102 12/02/2021   CO2 28 12/02/2021   BUN 28 (H) 12/02/2021   CREATININE 1.18 (H) 12/02/2021   GFRNONAA 46 (L) 12/02/2021   CALCIUM 9.5 12/02/2021   PROT 7.4 12/02/2021   ALBUMIN 3.8 12/02/2021   LABGLOB 2.8 04/20/2021   AGRATIO 1.4 04/20/2021   BILITOT 0.6 12/02/2021   ALKPHOS 78 12/02/2021   AST 27 12/02/2021   ALT 14 12/02/2021   ANIONGAP 9 12/02/2021       Objective    BP 136/61 (BP Location: Right Arm, Patient Position: Sitting, Cuff Size: Normal)   Pulse 74   Resp 16   Ht '5\' 8"'$  (1.727 m)   Wt 191 lb (86.6 kg)   SpO2 97%   BMI 29.04 kg/m   BP Readings from Last 3 Encounters:  12/06/21 136/61  12/02/21 (!) 144/59  11/26/21 (!) 148/77   Wt Readings from Last 3 Encounters:  12/06/21 191 lb (86.6 kg)  12/02/21 192 lb 10.9 oz (87.4 kg)  11/26/21 193 lb (87.5 kg)      Physical Exam Vitals and nursing note reviewed.  Constitutional:      General: She is not in acute distress.    Appearance: Normal appearance. She is overweight. She is not ill-appearing, toxic-appearing or diaphoretic.  HENT:     Head: Normocephalic and atraumatic.  Cardiovascular:  Rate and Rhythm: Normal rate and regular rhythm.     Pulses: Normal pulses.     Heart sounds: Normal heart sounds. No murmur heard.    No friction rub. No gallop.  Pulmonary:     Effort: Pulmonary effort is normal. No respiratory distress.     Breath sounds: Normal breath sounds. No stridor. No wheezing, rhonchi or rales.  Chest:     Chest wall: No tenderness.  Abdominal:     General: Bowel sounds are normal.     Palpations: Abdomen is soft.  Musculoskeletal:        General: No swelling, tenderness, deformity or signs of injury. Normal range of motion.     Right lower leg: Edema present.      Left lower leg: Edema present.  Skin:    General: Skin is warm and dry.     Capillary Refill: Capillary refill takes less than 2 seconds.     Coloration: Skin is not jaundiced or pale.     Findings: No bruising, erythema, lesion or rash.  Neurological:     General: No focal deficit present.     Mental Status: She is alert and oriented to person, place, and time. Mental status is at baseline.     Cranial Nerves: No cranial nerve deficit.     Sensory: No sensory deficit.     Motor: No weakness.     Coordination: Coordination normal.  Psychiatric:        Mood and Affect: Mood normal.        Behavior: Behavior normal.        Thought Content: Thought content normal.        Judgment: Judgment normal.     No results found for any visits on 12/06/21.  Assessment & Plan     Problem List Items Addressed This Visit       Digestive   Gastroenteritis - Primary    Acute, improved Complaints of hemorrhoid exacerbation s/s frequent BM movements Repeat CBC and BMP given previously mildly decreased kidney function s/s dehydration and elevated WBC. Reports appetite is coming back slowly; denies weakness or fatigue       Relevant Orders   CBC   Basic Metabolic Panel (BMET)   Return if symptoms worsen or fail to improve.     Vonna Kotyk, FNP, have reviewed all documentation for this visit. The documentation on 12/06/21 for the exam, diagnosis, procedures, and orders are all accurate and complete.  [name], [place] [phone] (phone) [phone] (fax)  [place]

## 2021-12-07 LAB — BASIC METABOLIC PANEL
BUN/Creatinine Ratio: 14 (ref 12–28)
BUN: 13 mg/dL (ref 8–27)
CO2: 25 mmol/L (ref 20–29)
Calcium: 8.8 mg/dL (ref 8.7–10.3)
Chloride: 102 mmol/L (ref 96–106)
Creatinine, Ser: 0.9 mg/dL (ref 0.57–1.00)
Glucose: 99 mg/dL (ref 70–99)
Potassium: 4 mmol/L (ref 3.5–5.2)
Sodium: 142 mmol/L (ref 134–144)
eGFR: 63 mL/min/{1.73_m2} (ref >59)

## 2021-12-07 LAB — CBC
Hematocrit: 38.5 % (ref 34.0–46.6)
Hemoglobin: 13.1 g/dL (ref 11.1–15.9)
MCH: 31.2 pg (ref 26.6–33.0)
MCHC: 34 g/dL (ref 31.5–35.7)
MCV: 92 fL (ref 79–97)
Platelets: 306 10*3/uL (ref 150–450)
RBC: 4.2 x10E6/uL (ref 3.77–5.28)
RDW: 11.8 % (ref 11.7–15.4)
WBC: 15.3 10*3/uL — ABNORMAL HIGH (ref 3.4–10.8)

## 2021-12-08 NOTE — Progress Notes (Signed)
Elevated white cell count remains; however, creatinine has improved. Continue to stay hydrated with water- goal of 48-64 oz.  Thanks

## 2021-12-20 NOTE — Progress Notes (Deleted)
Complete physical exam   Patient: Eileen Flores   DOB: 1935-07-31   86 y.o. Female  MRN: [id number] Visit Date: 12/22/2021  Today's healthcare provider: [name], FNP   No chief complaint on file.  Subjective    Eileen Flores is a 86 y.o. female who presents today for a complete physical exam.  She reports consuming a {diet types:17450} diet. {Exercise:19826} She generally feels {well/fairly well/poorly:18703}. She reports sleeping {well/fairly well/poorly:18703}. She {does/does not:200015} have additional problems to discuss today.  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   cataract surgery Bilateral 2009   COLONOSCOPY  2011   Dr. Bary Castilla   dermbrasion   1965   OVARIAN CYST REMOVAL  1967   POLYPECTOMY  2006   THYROIDECTOMY  2006   TONGUE SURGERY  2009   Social History   Socioeconomic History   Marital status: Widowed    Spouse name: Not on file   Number of children: 2   Years of education: Not on file   Highest education level: Some college, no degree  Occupational  History   Not on file  Tobacco Use   Smoking status: Former    Types: Cigarettes   Smokeless tobacco: Never   Tobacco comments:    quit in mid to late 20's  Vaping Use   Vaping Use: Never used  Substance and Sexual Activity   Alcohol use: No    Comment: rare- 1 drink   Drug use: No   Sexual activity: Not on file  Other Topics Concern   Not on file  Social History Narrative   Not on file   Social Determinants of Health   Financial Resource Strain: Low Risk  (11/03/2021)   Overall Financial Resource Strain (CARDIA)    Difficulty of Paying Living Expenses: Not hard at all  Food Insecurity: No Food Insecurity (11/03/2021)   Hunger Vital Sign    Worried About Running Out of Food in the Last Year: Never true    Ran Out of Food in the Last Year: Never true  Transportation Needs: No Transportation Needs (11/03/2021)   PRAPARE - Hydrologist (Medical): No    Lack of Transportation (Non-Medical): No  Physical Activity: Insufficiently Active (11/03/2021)   Exercise Vital Sign    Days of Exercise per Week: 3 days    Minutes of Exercise per Session: 30 min  Stress: No Stress Concern Present (11/03/2021)  English Questionnaire    Feeling of Stress : Only a little  Social Connections: Socially Isolated (11/03/2021)   Social Connection and Isolation Panel [NHANES]    Frequency of Communication with Friends and Family: Twice a week    Frequency of Social Gatherings with Friends and Family: Once a week    Attends Religious Services: Never    Marine scientist or Organizations: No    Attends Archivist Meetings: Never    Marital Status: Widowed  Intimate Partner Violence: Not At Risk (11/03/2021)   Humiliation, Afraid, Rape, and Kick questionnaire    Fear of Current or Ex-Partner: No    Emotionally Abused: No    Physically Abused: No    Sexually Abused: No   Family Status  Relation Name  Status   Other  Deceased   Mother  Deceased   Father  Deceased   Neg Hx  (Not Specified)   Family History  Problem Relation Age of Onset   Colon cancer Other    Colon polyps Other    Cancer Mother 34       liver cancer   Cancer Father 64       liver cancer   Alcohol abuse Father    Breast cancer Neg Hx    Allergies  Allergen Reactions   Penicillin G Rash   Penicillins Rash    Patient Care Team: [name], FNP as PCP - General (Family Medicine) Bary Castilla, Forest Gleason, MD (General Surgery) Leandrew Koyanagi, MD as Referring Physician (Ophthalmology) Margaretha Sheffield, MD (Otolaryngology)   Medications: Outpatient Medications Prior to Visit  Medication Sig   Calcium Carb-Cholecalciferol 600-10 MG-MCG CHEW Chew by mouth.   conjugated estrogens (PREMARIN) vaginal cream Place 1 Applicatorful vaginally daily. Use pea sized amount M-W-Fr before bedtime   Cranberry-Vitamin C-Probiotic (AZO CRANBERRY PO) Take by mouth.   levothyroxine (SYNTHROID) 112 MCG tablet Take 1 tablet (112 mcg total) by mouth daily before breakfast.   lisinopril (ZESTRIL) 20 MG tablet TAKE 1 TABLET BY MOUTH EVERY DAY   mirabegron ER (MYRBETRIQ) 25 MG TB24 tablet Take 1 tablet (25 mg total) by mouth daily. (Patient not taking: Reported on 12/06/2021)   timolol (TIMOPTIC) 0.5 % ophthalmic solution Place 1 drop into both eyes daily.   No facility-administered medications prior to visit.    Review of Systems  {Labs  Heme  Chem  Endocrine  Serology  Results Review (optional):23779}  Objective    There were no vitals taken for this visit. {Show previous vital signs (optional):23777}   Physical Exam  ***  Last depression screening scores    12/06/2021   10:58 AM 11/03/2021    1:50 PM 11/02/2020    5:09 PM  PHQ 2/9 Scores  PHQ - 2 Score 0 1 0  PHQ- 9 Score 2 3    Last fall risk screening    12/06/2021   10:58 AM  [place] in the past year? 0  Number falls in past yr: 0  Injury  with Fall? 0  Risk for fall due to : No Fall Risks  Follow up Falls evaluation completed   Last Audit-C alcohol use screening    12/06/2021   10:58 AM  Alcohol Use Disorder Test (AUDIT)  1. How often do you have a drink containing alcohol? 0  2. How many drinks containing alcohol do you have on a typical day when you are drinking? 0  3.  How often do you have six or more drinks on one occasion? 0  AUDIT-C Score 0   A score of 3 or more in women, and 4 or more in men indicates increased risk for alcohol abuse, EXCEPT if all of the points are from question 1   No results found for any visits on 12/22/21.  Assessment & Plan    Routine Health Maintenance and Physical Exam  Exercise Activities and Dietary recommendations  Goals      DIET - EAT MORE FRUITS AND VEGETABLES     Recommend increasing fruits and vegetables in diet to at least two servings of each a day.       DIET - EAT MORE FRUITS AND VEGETABLES     Exercise 3x per week (30 min per time)     Recommend to exercise for 3 days a week for at least 30 minutes at a time.      Increase water intake     Recommend increasing water intake to 4-5 glasses a day.         Immunization History  Administered Date(s) Administered   Fluad Quad(high Dose 65+) 10/22/2018, 12/21/2020   Influenza, High Dose Seasonal PF 11/21/2013, 11/05/2016, 11/28/2017   PNEUMOCOCCAL CONJUGATE-20 12/21/2020   Pneumococcal Conjugate-13 07/05/2017   Pneumococcal-Unspecified 01/14/2013    Health Maintenance  Topic Date Due   TETANUS/TDAP  Never done   Zoster Vaccines- Shingrix (1 of 2) Never done   INFLUENZA VACCINE  05/08/2022 (Originally 09/07/2021)   Medicare Annual Wellness (AWV)  11/04/2022   Pneumonia Vaccine 75+ Years old  Completed   HPV VACCINES  Aged Out   DEXA SCAN  Discontinued   COVID-19 Vaccine  Discontinued    Discussed health benefits of physical activity, and encouraged her to engage in regular exercise appropriate for her age  and condition.  ***  No follow-ups on file.     {provider attestation***:1}   [name], [place] [phone] (phone) [phone] (fax)  [place]

## 2021-12-22 ENCOUNTER — Encounter: Payer: Medicare HMO | Admitting: Family Medicine

## 2022-01-25 DIAGNOSIS — H401131 Primary open-angle glaucoma, bilateral, mild stage: Secondary | ICD-10-CM | POA: Diagnosis not present

## 2022-01-26 ENCOUNTER — Telehealth: Payer: Self-pay | Admitting: *Deleted

## 2022-01-26 ENCOUNTER — Encounter: Payer: Self-pay | Admitting: *Deleted

## 2022-01-26 NOTE — Patient Instructions (Signed)
Visit Information  Thank you for taking time to visit with me today. Please don't hesitate to contact me if I can be of assistance to you before our next scheduled telephone appointment.  Following are the goals we discussed today:  Discuss intolerance of Myrbetriq with urology. Discuss interest in dietician referral with PCP.  Our next appointment is by telephone on 1/22  Please call the care guide team at [phone] if you need to cancel or reschedule your appointment.   Please call the Suicide and Crisis Lifeline: 988 call the [place] National Suicide Prevention Lifeline: 5753526491 or TTY: (781)674-5641 TTY (520)836-2753) to talk to a trained counselor call 1-800-273-TALK (toll free, 24 hour hotline) call 911 if you are experiencing a Mental Health or [place] or need someone to talk to.  Patient verbalizes understanding of instructions and care plan provided today and agrees to view in [place]. Active MyChart status and patient understanding of how to access instructions and care plan via MyChart confirmed with patient.     The patient has been provided with contact information for the care management team and has been advised to call with any health related questions or concerns.   Valente David, RN, MSN, [place] Care Management Care Management Coordinator [phone]

## 2022-01-26 NOTE — Patient Outreach (Signed)
  Care Coordination   Initial Visit Note   01/26/2022 Name: Eileen Flores MRN: [id number] DOB: May 27, 1935  Eileen Flores is a 86 y.o. year old female who sees Eileen Sprout, FNP for primary care. I engaged with Eileen Flores on the telephone today.  What matters to the patients health and wellness today?  Doing well overall, trying to change diet to low sodium.    Goals Addressed             This Visit's Progress    Management of chronic health conditions       Care Coordination Interventions: Evaluation of current treatment plan related to incontinence and patient's adherence to plan as established by provider Advised patient to discuss ongoing issue with urology Reviewed medications with patient and discussed inability to tolerate Myrbetriq, will discuss alternatives with provider Reviewed scheduled/upcoming provider appointments including urology follow up on 1/19 Screening for signs and symptoms of depression related to chronic disease state  Assessed social determinant of health barriers Evaluation of current treatment plan related to hypertension self management and patient's adherence to plan as established by provider Advised patient to discuss referral for dietician with provider Provided education on prescribed diet low sodium, state she loves salt and is having a hard time adjusting to a low sodium diet Discussed complications of poorly controlled blood pressure such as heart disease, stroke, circulatory complications, vision complications, kidney impairment, sexual dysfunction Follow up with PCP on 1/4         SDOH assessments and interventions completed:  Yes  SDOH Interventions Today    Flowsheet Row Most Recent Value  SDOH Interventions   Food Insecurity Interventions Intervention Not Indicated  Housing Interventions Intervention Not Indicated  Transportation Interventions Intervention Not Indicated        Care Coordination Interventions:  Yes,  provided   Follow up plan: Follow up call scheduled for 1/22    Encounter Outcome:  Pt. Visit Completed   Valente David, RN, MSN, [place] Care Management Care Management Coordinator [phone]

## 2022-01-26 NOTE — Patient Outreach (Signed)
  Care Coordination   01/26/2022 Name: JORDI KAMM MRN: [id number] DOB: 1935/08/03   Care Coordination Outreach Attempts:  An unsuccessful telephone outreach was attempted today to offer the patient information about available care coordination services as a benefit of their health plan.   Follow Up Plan:  Additional outreach attempts will be made to offer the patient care coordination information and services.   Encounter Outcome:  No Answer   Care Coordination Interventions:  No, not indicated    Valente David, RN, MSN, [facility] Care Management Care Management Coordinator [phone]

## 2022-02-02 ENCOUNTER — Encounter: Payer: Self-pay | Admitting: Family Medicine

## 2022-02-02 ENCOUNTER — Ambulatory Visit (INDEPENDENT_AMBULATORY_CARE_PROVIDER_SITE_OTHER): Payer: Medicare HMO | Admitting: Family Medicine

## 2022-02-02 VITALS — BP 128/61 | HR 66 | Temp 98.2°F | Wt 190.7 lb

## 2022-02-02 DIAGNOSIS — N390 Urinary tract infection, site not specified: Secondary | ICD-10-CM

## 2022-02-02 DIAGNOSIS — R3915 Urgency of urination: Secondary | ICD-10-CM | POA: Insufficient documentation

## 2022-02-02 DIAGNOSIS — R311 Benign essential microscopic hematuria: Secondary | ICD-10-CM

## 2022-02-02 LAB — POCT URINALYSIS DIPSTICK
Bilirubin, UA: NEGATIVE
Glucose, UA: NEGATIVE
Ketones, UA: NEGATIVE
Nitrite, UA: NEGATIVE
Protein, UA: NEGATIVE
Spec Grav, UA: 1.01 (ref 1.010–1.025)
Urobilinogen, UA: 0.2 E.U./dL
pH, UA: 6 (ref 5.0–8.0)

## 2022-02-02 NOTE — Assessment & Plan Note (Signed)
UA+ blood and leuks - for nitrates Continue to push fluids and use OTC agents to assist Abx if culture indicates; pt in agreement

## 2022-02-02 NOTE — Progress Notes (Signed)
Urine culture sent. Pt was made aware in office

## 2022-02-02 NOTE — Progress Notes (Signed)
I,Connie R Striblin,acting as a Education administrator for [name], FNP.,have documented all relevant documentation on the behalf of [name], FNP,as directed by  [name], FNP while in the presence of [name], FNP. Established patient visit  Patient: Eileen Flores   DOB: 02/09/1935   86 y.o. Female  MRN: [id number] Visit Date: 02/02/2022  Today's healthcare provider: [name], FNP  Introduced to nurse practitioner role and practice setting.  All questions answered.  Discussed provider/patient relationship and expectations.  Subjective    HPI  Urinary symptoms  She reports new onset urinary urgency and lower back pain . The current episode started about a week ago and is worsening. Patient states symptoms are moderate in intensity, occurring constantly.    Associated symptoms: Yes abdominal pain Yes back pain  No chills No constipation  No cramping Yes diarrhea  No discharge No fever  No hematuria No nausea  No vomiting    ---------------------------------------------------------------------------------------  Medications: Outpatient Medications Prior to Visit  Medication Sig   Calcium Carb-Cholecalciferol 600-10 MG-MCG CHEW Chew by mouth.   conjugated estrogens (PREMARIN) vaginal cream Place 1 Applicatorful vaginally daily. Use pea sized amount M-W-Fr before bedtime   Cranberry-Vitamin C-Probiotic (AZO CRANBERRY PO) Take by mouth.   levothyroxine (SYNTHROID) 112 MCG tablet Take 1 tablet (112 mcg total) by mouth daily before breakfast.   lisinopril (ZESTRIL) 20 MG tablet TAKE 1 TABLET BY MOUTH EVERY DAY   mirabegron ER (MYRBETRIQ) 25 MG TB24 tablet Take 1 tablet (25 mg total) by mouth daily.   timolol (TIMOPTIC) 0.5 % ophthalmic solution Place 1 drop into both eyes daily.   No facility-administered medications prior to visit.    Review of Systems    Objective    BP 128/61 (BP Location: Left Arm, Patient Position: Sitting, Cuff Size: Large)   Pulse 66   Temp  98.2 F (36.8 C) (Oral)   Wt 190 lb 11.2 oz (86.5 kg)   SpO2 99%   BMI 29.00 kg/m   Physical Exam Vitals and nursing note reviewed.  Constitutional:      General: She is not in acute distress.    Appearance: Normal appearance. She is overweight. She is not ill-appearing, toxic-appearing or diaphoretic.  HENT:     Head: Normocephalic and atraumatic.  Cardiovascular:     Rate and Rhythm: Normal rate and regular rhythm.     Pulses: Normal pulses.     Heart sounds: Normal heart sounds. No murmur heard.    No friction rub. No gallop.  Pulmonary:     Effort: Pulmonary effort is normal. No respiratory distress.     Breath sounds: Normal breath sounds. No stridor. No wheezing, rhonchi or rales.  Chest:     Chest wall: No tenderness.  Abdominal:     General: Bowel sounds are normal.     Palpations: Abdomen is soft.     Tenderness: There is no abdominal tenderness. There is no right CVA tenderness, left CVA tenderness or guarding.  Musculoskeletal:        General: No swelling, tenderness, deformity or signs of injury. Normal range of motion.     Right lower leg: No edema.     Left lower leg: No edema.  Skin:    General: Skin is warm and dry.     Capillary Refill: Capillary refill takes less than 2 seconds.     Coloration: Skin is not jaundiced or pale.     Findings: No bruising,  erythema, lesion or rash.  Neurological:     General: No focal deficit present.     Mental Status: She is alert and oriented to person, place, and time. Mental status is at baseline.     Cranial Nerves: No cranial nerve deficit.     Sensory: No sensory deficit.     Motor: No weakness.     Coordination: Coordination normal.  Psychiatric:        Mood and Affect: Mood normal.        Behavior: Behavior normal.        Thought Content: Thought content normal.        Judgment: Judgment normal.     Results for orders placed or performed in visit on 02/02/22  POCT Urinalysis Dipstick  Result Value Ref Range    Color, UA yellow    Clarity, UA clear    Glucose, UA Negative Negative   Bilirubin, UA negative    Ketones, UA negative    Spec Grav, UA 1.010 1.010 - 1.025   Blood, UA hemolyzed trace    pH, UA 6.0 5.0 - 8.0   Protein, UA Negative Negative   Urobilinogen, UA 0.2 0.2 or 1.0 E.U./dL   Nitrite, UA negative    Leukocytes, UA Moderate (2+) (A) Negative   Appearance     Odor      Assessment & Plan     Problem List Items Addressed This Visit       Genitourinary   Benign essential microscopic hematuria    Noted on in office UA Ucx pending Recommend follow up with uro gyn given frequency of urinary complaints and UTIs      Relevant Orders   Ambulatory referral to Urogynecology   Frequent UTI    Ongoing dysuria despite use of 'cranberry' supplements Concern for vaginal dryness assisting with frequency Recommend f/u urogyn for further assistance      Relevant Orders   Ambulatory referral to Urogynecology     Other   Urgency of urination - Primary    UA+ blood and leuks - for nitrates Continue to push fluids and use OTC agents to assist Abx if culture indicates; pt in agreement       Relevant Orders   POCT Urinalysis Dipstick (Completed)   Urine Culture   Ambulatory referral to Urogynecology   Return if symptoms worsen or fail to improve.     Vonna Kotyk, FNP, have reviewed all documentation for this visit. The documentation on 02/02/22 for the exam, diagnosis, procedures, and orders are all accurate and complete.  [name], [place] [phone] (phone) [phone] (fax)  [place]

## 2022-02-02 NOTE — Assessment & Plan Note (Signed)
Ongoing dysuria despite use of 'cranberry' supplements Concern for vaginal dryness assisting with frequency Recommend f/u urogyn for further assistance

## 2022-02-02 NOTE — Assessment & Plan Note (Signed)
Noted on in office UA Ucx pending Recommend follow up with uro gyn given frequency of urinary complaints and UTIs

## 2022-02-04 LAB — URINE CULTURE

## 2022-02-04 NOTE — Progress Notes (Signed)
Normal culture; continue to recommend follow up with uro gyn for recurrence concerns.

## 2022-02-10 ENCOUNTER — Encounter: Payer: Self-pay | Admitting: Family Medicine

## 2022-02-10 ENCOUNTER — Ambulatory Visit (INDEPENDENT_AMBULATORY_CARE_PROVIDER_SITE_OTHER): Payer: Medicare HMO | Admitting: Family Medicine

## 2022-02-10 VITALS — BP 145/63 | HR 62 | Temp 98.2°F | Resp 16 | Ht 68.0 in | Wt 194.2 lb

## 2022-02-10 DIAGNOSIS — M8589 Other specified disorders of bone density and structure, multiple sites: Secondary | ICD-10-CM

## 2022-02-10 DIAGNOSIS — Z0000 Encounter for general adult medical examination without abnormal findings: Secondary | ICD-10-CM | POA: Diagnosis not present

## 2022-02-10 DIAGNOSIS — I10 Essential (primary) hypertension: Secondary | ICD-10-CM

## 2022-02-10 DIAGNOSIS — R918 Other nonspecific abnormal finding of lung field: Secondary | ICD-10-CM

## 2022-02-10 MED ORDER — LISINOPRIL 20 MG PO TABS: 40.0000 mg | ORAL_TABLET | Freq: Every day | ORAL | 3 refills | Status: DC

## 2022-02-10 MED ORDER — CIPROFLOXACIN-DEXAMETHASONE 0.3-0.1 % OT SUSP: 4.0000 [drp] | Freq: Two times a day (BID) | OTIC | 0 refills | Status: DC

## 2022-02-10 NOTE — Progress Notes (Signed)
I,Joseline E Rosas,acting as a scribe for Eileen Sprout, FNP.,have documented all relevant documentation on the behalf of Eileen Sprout, FNP,as directed by  Eileen Sprout, FNP while in the presence of Eileen Sprout, FNP.   Complete physical exam  Patient: Eileen Flores   DOB: [birth year]   87 y.o. Female  MRN: [id number] Visit Date: 02/10/2022  Today's healthcare provider: [name], FNP  Re Introduced to nurse practitioner role and practice setting.  All questions answered.  Discussed provider/patient relationship and expectations.  Subjective    Eileen Flores is a 87 y.o. female who presents today for a complete physical exam.  She reports consuming a general diet. Home exercise routine includes some walking. She generally feels well. She reports sleeping fairly well. She does not have additional problems to discuss today.   HPI  AWV done 11/03/2021  Past Medical History:  Diagnosis Date   Allergy    Arthritis 1980   Bowel trouble 1970   Cancer 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   cataract surgery Bilateral 2009   COLONOSCOPY  2011   Dr. Bary Castilla   dermbrasion   1965   OVARIAN CYST  REMOVAL  1967   POLYPECTOMY  2006   THYROIDECTOMY  2006   TONGUE SURGERY  2009   Social History   Socioeconomic History   Marital status: Widowed    Spouse name: Not on file   Number of children: 2   Years of education: Not on file   Highest education level: Some college, no degree  Occupational History   Not on file  Tobacco Use   Smoking status: Former    Types: Cigarettes   Smokeless tobacco: Never   Tobacco comments:    quit in mid to late 20's  Vaping Use   Vaping Use: Never used  Substance and Sexual Activity   Alcohol use: No    Comment: rare- 1 drink   Drug use: No   Sexual activity: Not on file  Other Topics Concern   Not on file  Social History Narrative   Not on file   Social Determinants of Health   Financial Resource Strain: Low Risk  (11/03/2021)   Overall Financial Resource Strain (CARDIA)    Difficulty of Paying Living Expenses: Not hard at all  Food Insecurity: No Food Insecurity (01/26/2022)   Hunger Vital Sign    Worried About Running Out of Food in the Last Year: Never true    [place] in the Last Year: Never true  Transportation Needs: No Transportation Needs (01/26/2022)   [place] -  Hydrologist (Medical): No    Lack of Transportation (Non-Medical): No  Physical Activity: Insufficiently Active (11/03/2021)   Exercise Vital Sign    Days of Exercise per Week: 3 days    Minutes of Exercise per Session: 30 min  Stress: No Stress Concern Present (11/03/2021)   [place]    Feeling of Stress : Only a little  Social Connections: Socially Isolated (11/03/2021)   Social Connection and Isolation Panel [NHANES]    Frequency of Communication with Friends and Family: Twice a week    Frequency of Social Gatherings with Friends and Family: Once a week    Attends Religious Services: Never    Marine scientist or Organizations: No    Attends Theatre manager Meetings: Never    Marital Status: Widowed  Intimate Partner Violence: Not At Risk (11/03/2021)   Humiliation, Afraid, Rape, and Kick questionnaire    Fear of Current or Ex-Partner: No    Emotionally Abused: No    Physically Abused: No    Sexually Abused: No   Family Status  Relation Name Status   Other  Deceased   Mother  Deceased   Father  Deceased   Neg Hx  (Not Specified)   Family History  Problem Relation Age of Onset   Colon cancer Other    Colon polyps Other    Cancer Mother 73       liver cancer   Cancer Father 68       liver cancer   Alcohol abuse Father    Breast cancer Neg Hx    Allergies  Allergen Reactions   Penicillin G Rash   Penicillins Rash    Patient Care Team: Eileen Sprout, FNP as PCP - General (Family Medicine) Bary Castilla, Forest Gleason, MD (General Surgery) Leandrew Koyanagi, MD as Referring Physician (Ophthalmology) Margaretha Sheffield, MD (Otolaryngology) Valente David, RN as [place] Management   Medications: Outpatient Medications Prior to Visit  Medication Sig   Calcium Carb-Cholecalciferol 600-10 MG-MCG CHEW Chew by mouth.   conjugated estrogens (PREMARIN) vaginal cream Place 1 Applicatorful vaginally daily. Use pea sized amount M-W-Fr before bedtime   Cranberry-Vitamin C-Probiotic (AZO CRANBERRY PO) Take by mouth.   mirabegron ER (MYRBETRIQ) 25 MG TB24 tablet Take 1 tablet (25 mg total) by mouth daily.   timolol (TIMOPTIC) 0.5 % ophthalmic solution Place 1 drop into both eyes daily.   [DISCONTINUED] levothyroxine (SYNTHROID) 112 MCG tablet Take 1 tablet (112 mcg total) by mouth daily before breakfast.   [DISCONTINUED] lisinopril (ZESTRIL) 20 MG tablet TAKE 1 TABLET BY MOUTH EVERY DAY   No facility-administered medications prior to visit.   Review of Systems  Constitutional:  Negative for appetite change, chills, fatigue and fever.  Respiratory:  Negative for chest tightness and shortness of breath.    Cardiovascular:  Negative for chest pain and palpitations.  Gastrointestinal:  Negative for abdominal pain, nausea and vomiting.  Neurological:  Negative for dizziness and weakness.  All other systems reviewed and are negative.   Objective    BP (!) 145/63 (BP Location: Left Arm, Patient Position: Sitting, Cuff Size: Large)   Pulse 62   Temp 98.2 F (36.8 C) (Oral)   Resp 16   Ht '5\' 8"'$  (1.727 m)   Wt 194 lb 3.2 oz (88.1 kg)   BMI 29.53 kg/m   Physical Exam Vitals and nursing note reviewed.  Constitutional:  General: She is awake. She is not in acute distress.    Appearance: Normal appearance. She is well-developed and well-groomed. She is obese. She is not ill-appearing, toxic-appearing or diaphoretic.  HENT:     Head: Normocephalic and atraumatic.     Jaw: There is normal jaw occlusion. No trismus, tenderness, swelling or pain on movement.     Right Ear: Hearing, tympanic membrane, ear canal and external ear normal. There is no impacted cerumen.     Left Ear: Hearing, tympanic membrane, ear canal and external ear normal. There is no impacted cerumen.     Nose: Nose normal. No congestion or rhinorrhea.     Right Turbinates: Not enlarged, swollen or pale.     Left Turbinates: Not enlarged, swollen or pale.     Right Sinus: No maxillary sinus tenderness or frontal sinus tenderness.     Left Sinus: No maxillary sinus tenderness or frontal sinus tenderness.     Mouth/Throat:     Lips: Pink.     Mouth: Mucous membranes are moist. No injury.     Tongue: No lesions.     Pharynx: Oropharynx is clear. Uvula midline. No pharyngeal swelling, oropharyngeal exudate, posterior oropharyngeal erythema or uvula swelling.     Tonsils: No tonsillar exudate or tonsillar abscesses.  Eyes:     General: Lids are normal. Lids are everted, no foreign bodies appreciated. Vision grossly intact. Gaze aligned appropriately. No allergic shiner or visual field deficit.       Right eye: No discharge.         Left eye: No discharge.     Extraocular Movements: Extraocular movements intact.     Conjunctiva/sclera: Conjunctivae normal.     Right eye: Right conjunctiva is not injected. No exudate.    Left eye: Left conjunctiva is not injected. No exudate.    Pupils: Pupils are equal, round, and reactive to light.  Neck:     Thyroid: No thyroid mass, thyromegaly or thyroid tenderness.     Vascular: No carotid bruit.     Trachea: Trachea normal.  Cardiovascular:     Rate and Rhythm: Normal rate and regular rhythm.     Pulses: Normal pulses.          Carotid pulses are 2+ on the right side and 2+ on the left side.      Radial pulses are 2+ on the right side and 2+ on the left side.       Dorsalis pedis pulses are 2+ on the right side and 2+ on the left side.       Posterior tibial pulses are 2+ on the right side and 2+ on the left side.     Heart sounds: Normal heart sounds, S1 normal and S2 normal. No murmur heard.    No friction rub. No gallop.  Pulmonary:     Effort: Pulmonary effort is normal. No respiratory distress.     Breath sounds: Normal breath sounds and air entry. No stridor. No wheezing, rhonchi or rales.  Chest:     Chest wall: No tenderness.  Abdominal:     General: Abdomen is flat. Bowel sounds are normal. There is no distension.     Palpations: Abdomen is soft. There is no mass.     Tenderness: There is no abdominal tenderness. There is no right CVA tenderness, left CVA tenderness, guarding or rebound.     Hernia: No hernia is present.  Genitourinary:    Comments: Exam deferred; denies complaints Musculoskeletal:  General: No swelling, tenderness, deformity or signs of injury. Normal range of motion.     Cervical back: Full passive range of motion without pain, normal range of motion and neck supple. No edema, rigidity or tenderness. No muscular tenderness.     Right lower leg: No edema.     Left lower leg: No edema.  Lymphadenopathy:     Cervical: No cervical  adenopathy.     Right cervical: No superficial, deep or posterior cervical adenopathy.    Left cervical: No superficial, deep or posterior cervical adenopathy.  Skin:    General: Skin is warm and dry.     Capillary Refill: Capillary refill takes less than 2 seconds.     Coloration: Skin is not jaundiced or pale.     Findings: No bruising, erythema, lesion or rash.  Neurological:     General: No focal deficit present.     Mental Status: She is alert and oriented to person, place, and time. Mental status is at baseline.     GCS: GCS eye subscore is 4. GCS verbal subscore is 5. GCS motor subscore is 6.     Sensory: Sensation is intact. No sensory deficit.     Motor: Weakness present.     Coordination: Coordination is intact. Coordination normal.     Gait: Gait abnormal.     Comments: Use of cane in setting of chronic OA in lower back; denies falls   Psychiatric:        Attention and Perception: Attention and perception normal.        Mood and Affect: Mood and affect normal.        Speech: Speech normal.        Behavior: Behavior normal. Behavior is cooperative.        Thought Content: Thought content normal.        Cognition and Memory: Cognition and memory normal.        Judgment: Judgment normal.    Last depression screening scores    02/10/2022    1:55 PM 12/06/2021   10:58 AM 11/03/2021    1:50 PM  PHQ 2/9 Scores  PHQ - 2 Score 0 0 1  PHQ- 9 Score  2 3   Last fall risk screening    02/10/2022    1:55 PM  [place] in the past year? 0  Number falls in past yr: 0  Injury with Fall? 0  Risk for fall due to : No Fall Risks   Last Audit-C alcohol use screening    12/06/2021   10:58 AM  Alcohol Use Disorder Test (AUDIT)  1. How often do you have a drink containing alcohol? 0  2. How many drinks containing alcohol do you have on a typical day when you are drinking? 0  3. How often do you have six or more drinks on one occasion? 0  AUDIT-C Score 0   A score of 3  or more in women, and 4 or more in men indicates increased risk for alcohol abuse, EXCEPT if all of the points are from question 1   No results found for any visits on 02/10/22.  Assessment & Plan    Routine Health Maintenance and Physical Exam  Exercise Activities and Dietary recommendations  Goals      DIET - EAT MORE FRUITS AND VEGETABLES     Recommend increasing fruits and vegetables in diet to at least two servings of each a day.  DIET - EAT MORE FRUITS AND VEGETABLES     Exercise 3x per week (30 min per time)     Recommend to exercise for 3 days a week for at least 30 minutes at a time.      Increase water intake     Recommend increasing water intake to 4-5 glasses a day.     Management of chronic health conditions     Care Coordination Interventions: Evaluation of current treatment plan related to incontinence and patient's adherence to plan as established by provider Advised patient to discuss ongoing issue with urology Reviewed medications with patient and discussed inability to tolerate Myrbetriq, will discuss alternatives with provider Reviewed scheduled/upcoming provider appointments including urology follow up on 1/19 Screening for signs and symptoms of depression related to chronic disease state  Assessed social determinant of health barriers Evaluation of current treatment plan related to hypertension self management and patient's adherence to plan as established by provider Advised patient to discuss referral for dietician with provider Provided education on prescribed diet low sodium, state she loves salt and is having a hard time adjusting to a low sodium diet Discussed complications of poorly controlled blood pressure such as heart disease, stroke, circulatory complications, vision complications, kidney impairment, sexual dysfunction Follow up with PCP on 1/4         Immunization History  Administered Date(s) Administered   Fluad Quad(high Dose 65+)  10/22/2018, 12/21/2020   Influenza, High Dose Seasonal PF 11/21/2013, 11/05/2016, 11/28/2017   PNEUMOCOCCAL CONJUGATE-20 12/21/2020   Pneumococcal Conjugate-13 07/05/2017   Pneumococcal-Unspecified 01/14/2013    Health Maintenance  Topic Date Due   DTaP/Tdap/Td (1 - Tdap) Never done   Zoster Vaccines- Shingrix (1 of 2) Never done   INFLUENZA VACCINE  05/08/2022 (Originally 09/07/2021)   Medicare Annual Wellness (AWV)  11/04/2022   Pneumonia Vaccine 41+ Years old  Completed   HPV VACCINES  Aged Out   DEXA SCAN  Discontinued   COVID-19 Vaccine  Discontinued    Discussed health benefits of physical activity, and encouraged her to engage in regular exercise appropriate for her age and condition.  Problem List Items Addressed This Visit       Cardiovascular and Mediastinum   Primary hypertension    Chronic, elevated Recommend titration of lisinopril from 20 to 40 mg to assist Goal <140/<90      Relevant Medications   lisinopril (ZESTRIL) 20 MG tablet     Respiratory   Lung nodule, multiple    Annual repeat CT given previous lung nodules; pt denies change in symptoms, neither acute nor chronic Referral placed to pulm       Relevant Orders   Ambulatory referral to Pulmonology     Musculoskeletal and Integument   Osteopenia of multiple sites    Chronic, stable Decreased bone strength noted. Can use Vit D 800 IU and Calcium 1200 mg to assist regular exercise for bone health. Can repeat DEXA this year if desired.       Relevant Orders   DG Bone Density     Other   Annual physical exam - Primary    Does not have derm Orders placed to mammo, DEXA, and pulm Recommend annual dental and vision appts Things to do to keep yourself healthy  - Exercise at least 30-45 minutes a day, 3-4 days a week.  - Eat a low-fat diet with lots of fruits and vegetables, up to 7-9 servings per day.  - Seatbelts can save your life. Wear them  always.  - Smoke detectors on every level of your  home, check batteries every year.  - Eye Doctor - have an eye exam every 1-2 years  - Safe sex - if you may be exposed to STDs, use a condom.  - Alcohol -  If you drink, do it moderately, less than 2 drinks per day.  - [place]. Choose someone to speak for you if you are not able.  - Depression is common in our stressful world.If you're feeling down or losing interest in things you normally enjoy, please come in for a visit.  - Violence - If anyone is threatening or hurting you, please call immediately.       Relevant Orders   MM 3D SCREEN BREAST BILATERAL   Return in about 6 months (around 08/11/2022) for chonic disease management.    Vonna Kotyk, FNP, have reviewed all documentation for this visit. The documentation on 02/10/22 for the exam, diagnosis, procedures, and orders are all accurate and complete.  Eileen Flores, [place] [phone] (phone) [phone] (fax)  [place]

## 2022-02-10 NOTE — Assessment & Plan Note (Signed)
Annual repeat CT given previous lung nodules; pt denies change in symptoms, neither acute nor chronic Referral placed to pulm

## 2022-02-10 NOTE — Assessment & Plan Note (Signed)
Does not have derm Orders placed to mammo, DEXA, and pulm Recommend annual dental and vision appts Things to do to keep yourself healthy  - Exercise at least 30-45 minutes a day, 3-4 days a week.  - Eat a low-fat diet with lots of fruits and vegetables, up to 7-9 servings per day.  - Seatbelts can save your life. Wear them always.  - Smoke detectors on every level of your home, check batteries every year.  - Eye Doctor - have an eye exam every 1-2 years  - Safe sex - if you may be exposed to STDs, use a condom.  - Alcohol -  If you drink, do it moderately, less than 2 drinks per day.  - [place]. Choose someone to speak for you if you are not able.  - Depression is common in our stressful world.If you're feeling down or losing interest in things you normally enjoy, please come in for a visit.  - Violence - If anyone is threatening or hurting you, please call immediately.

## 2022-02-10 NOTE — Assessment & Plan Note (Signed)
Chronic, stable Decreased bone strength noted. Can use Vit D 800 IU and Calcium 1200 mg to assist regular exercise for bone health. Can repeat DEXA this year if desired.

## 2022-02-10 NOTE — Patient Instructions (Signed)
The CDC recommends two doses of Shingrix (the new shingles vaccine) separated by 2 to 6 months for adults age 85 years and older. I recommend checking with your insurance plan regarding coverage for this vaccine.    

## 2022-02-10 NOTE — Assessment & Plan Note (Signed)
Chronic, elevated Recommend titration of lisinopril from 20 to 40 mg to assist Goal <140/<90

## 2022-02-17 ENCOUNTER — Other Ambulatory Visit: Payer: Self-pay | Admitting: Family Medicine

## 2022-02-17 DIAGNOSIS — M7989 Other specified soft tissue disorders: Secondary | ICD-10-CM

## 2022-02-25 ENCOUNTER — Encounter: Payer: Self-pay | Admitting: Urology

## 2022-02-25 ENCOUNTER — Ambulatory Visit: Payer: Medicare HMO | Admitting: Urology

## 2022-02-25 VITALS — BP 134/58 | HR 72 | Ht 68.0 in | Wt 192.0 lb

## 2022-02-25 DIAGNOSIS — Z87440 Personal history of urinary (tract) infections: Secondary | ICD-10-CM | POA: Diagnosis not present

## 2022-02-25 DIAGNOSIS — N390 Urinary tract infection, site not specified: Secondary | ICD-10-CM

## 2022-02-25 DIAGNOSIS — N3941 Urge incontinence: Secondary | ICD-10-CM

## 2022-02-25 LAB — BLADDER SCAN AMB NON-IMAGING

## 2022-02-25 NOTE — Progress Notes (Signed)
I, Jeanmarie Hubert Maxie,acting as a scribe for Hollice Espy, MD.,have documented all relevant documentation on the behalf of Hollice Espy, MD,as directed by  Hollice Espy, MD while in the presence of Hollice Espy, MD.   02/25/22 3:12 PM   [name] [birth year] [id number]  Referring provider: [name], [place],  [place] [address]  Chief Complaint  Patient presents with   Urinary Incontinence    HPI: 87 year-old female who presents today for a follow-up of recurrent UTI's and urge incontinence. She was last seen in October, at that point she was prescribed topical estrogen cream for her current issues.  She did not try the premarin cream. She stopped Myrbetriq due to stomach issues but she feels this was helping her urinary symptoms.  She reports that she ate out at [employer] and then ended up in the emergency room later that day.  She only took the medicine for a week.    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   cataract surgery Bilateral 2009   COLONOSCOPY  2011   Dr. BBary Castilla  dermbrasion   1965   OVARIAN CYST REMOVAL  1967   POLYPECTOMY  2006   THYROIDECTOMY  2006   TONGUE SURGERY  2009    Home Medications:  Allergies as of 02/25/2022       Reactions   Penicillin G Rash   Penicillins Rash        Medication List        Accurate as of February 25, 2022  3:12 PM. If you have any questions, ask your nurse or doctor.          STOP taking these medications    mirabegron ER 25 MG Tb24 tablet Commonly known as: MYRBETRIQ Stopped by: AHollice Espy MD   Premarin vaginal cream Generic drug: conjugated estrogens Stopped by: AHollice Espy MD       TAKE these medications    AZO CRANBERRY PO Take by mouth.   Calcium Carb-Cholecalciferol 600-10 MG-MCG Chew Chew by mouth.   ciprofloxacin-dexamethasone OTIC suspension Commonly known as: Ciprodex Place 4 drops into the left ear 2 (two) times daily.   levothyroxine 112 MCG tablet Commonly known as: SYNTHROID Take 112 mcg by mouth daily.  lisinopril 20 MG tablet Commonly known as: ZESTRIL Take 2 tablets (40 mg total) by mouth daily.   timolol 0.5 % ophthalmic solution Commonly known as: TIMOPTIC Place 1 drop into both eyes daily.        Allergies:  Allergies  Allergen Reactions   Penicillin G Rash   Penicillins Rash    Family History: Family History  Problem Relation Age of Onset   Colon cancer Other    Colon polyps Other    Cancer Mother 84       liver cancer   Cancer Father 80       liver cancer   Alcohol abuse Father    Breast cancer Neg Hx     Social History:  reports that she has quit smoking. Her smoking use included cigarettes. She has never used smokeless tobacco. She reports that she does not drink alcohol and does not use drugs.   Physical Exam: BP  (!) 134/58 (BP Location: Left Arm, Patient Position: Sitting, Cuff Size: Large)   Pulse 72   Ht '5\' 8"'$  (1.727 m)   Wt 192 lb (87.1 kg)   BMI 29.19 kg/m   Constitutional:  Alert and oriented, No acute distress. HEENT: [place] AT, moist mucus membranes.  Trachea midline, no masses. Neurologic: Grossly intact, no focal deficits, moving all 4 extremities. Psychiatric: Normal mood and affect.  Assessment & Plan:    Recurrent UTI's - She hasn't been using the estrogen cream, recommended her to do so. We talked about the true risk and benefits but given that it's such small amount and very low systemic absorption. It's quite safe and encouraged her to use this. Luckily, she's not had any infection since then.  2. Urinary urgency/OAB - It's highly unlikely that Myrbetriq was the etiology of her event. Encouraged her to try this medication again and she did feel like it was helping her she still has the samples at home. She's gonna try this again. She'll send [place] a my chart message letting us know how it goes.  Return for she will let us know how her Myrbetriq trial goes.  I have reviewed the above documentation for accuracy and completeness, and I agree with the above.   Hollice Espy, MD   [facility] [address] [place], [place] [address] [phone]

## 2022-02-28 ENCOUNTER — Ambulatory Visit: Payer: Self-pay | Admitting: *Deleted

## 2022-02-28 ENCOUNTER — Encounter: Payer: Medicare HMO | Admitting: *Deleted

## 2022-02-28 NOTE — Patient Outreach (Signed)
  Care Coordination   Follow Up Visit Note   02/28/2022 Name: Eileen Flores MRN: [id number] DOB: 1935/02/22  Eileen Flores is a 87 y.o. year old female who sees [name], FNP for primary care. I spoke with  Eileen Flores by phone today.  What matters to the patients health and wellness today?  Was seen by urology and PCP since last outreach, no urgent concerns noted.  Will call this RNCM with question.    Goals Addressed             This Visit's Progress    COMPLETED: Management of chronic health conditions   On track    Care Coordination Interventions: Evaluation of current treatment plan related to incontinence and patient's adherence to plan as established by provider Advised patient to discuss ongoing issue with urology Reviewed medications with patient and discussed restarting Myrbetriq, currently tolerating Reviewed scheduled/upcoming provider appointments including Pulmonary on 2/12 for routine screening on pre-existent lung nodules and 3/5 for mammogram Screening for signs and symptoms of depression related to chronic disease state  Assessed social determinant of health barriers Evaluation of current treatment plan related to hypertension self management and patient's adherence to plan as established by provider Advised patient to discuss referral for dietician with provider Provided education on prescribed diet low sodium, state she loves salt and is having a hard time adjusting to a low sodium diet Discussed complications of poorly controlled blood pressure such as heart disease, stroke, circulatory complications, vision complications, kidney impairment, sexual dysfunction Follow up with PCP as needed, AWV scheduled for September 2024         SDOH assessments and interventions completed:  No     Care Coordination Interventions:  Yes, provided   Follow up plan: No further intervention required.   Encounter Outcome:  Pt. Visit Completed   Valente David, RN,  MSN, [place] [facility] Care Management Coordinator [phone]

## 2022-03-08 ENCOUNTER — Institutional Professional Consult (permissible substitution): Payer: Medicare HMO | Admitting: Student in an Organized Health Care Education/Training Program

## 2022-03-09 ENCOUNTER — Encounter: Payer: Self-pay | Admitting: Student in an Organized Health Care Education/Training Program

## 2022-03-09 ENCOUNTER — Ambulatory Visit: Payer: Medicare HMO | Admitting: Student in an Organized Health Care Education/Training Program

## 2022-03-09 VITALS — BP 136/80 | HR 73 | Temp 97.9°F | Ht 68.0 in | Wt 196.6 lb

## 2022-03-09 DIAGNOSIS — J849 Interstitial pulmonary disease, unspecified: Secondary | ICD-10-CM | POA: Diagnosis not present

## 2022-03-09 DIAGNOSIS — R0602 Shortness of breath: Secondary | ICD-10-CM | POA: Diagnosis not present

## 2022-03-09 DIAGNOSIS — K222 Esophageal obstruction: Secondary | ICD-10-CM

## 2022-03-09 NOTE — Progress Notes (Signed)
Synopsis: Referred in for shortness of breath by [name], FNP  Assessment & Plan:   1. Shortness of breath 2. ILD (interstitial lung disease) ([place])  Presenting for the evaluation of findings on her chest CT from 2023 that is concerning for ILD. The differential for the findings, while not a high resolution CT, includes NSIP, HSP, and less likely UIP. I will initiate the workup by obtaining a HRCT to confirm the findings and help elucidate NSIP from [place] and assess for any progression. Given she's had a hiatal hernia, I will obtain a double contrast esophagogram to assess for reflux and aspiration. I will order blood work to include an auto-immune workup and myositis workup. I will also obtain pulmonary function testing to assess lung function. Finally, I will present her case during our upcoming ILD multi-disciplinary conference for guidance re diagnosis and management.  - DG ESOPHAGUS W DOUBLE CM (HD); Future - ANA 12 Plus Profile (RDL) - Anti-CCP Ab, IgG + IgA (RDL) - ANCA Profile - CK - CT CHEST HIGH RESOLUTION; Future - Hypersensitivity Pneumonitis - MyoMarker 3 Plus Profile (RDL) - Rheumatoid factor - Pulmonary Function Test [facility] Only; Future - CBC with Differential/Platelet - Comprehensive metabolic panel; Future   Return in about 6 weeks (around 04/20/2022).  I spent 60 minutes caring for this patient today, including preparing to see the patient, obtaining a medical history , reviewing a separately obtained history, performing a medically appropriate examination and/or evaluation, counseling and educating the patient/family/caregiver, ordering medications, tests, or procedures, documenting clinical information in the electronic health record, and independently interpreting results (not separately reported/billed) and communicating results to the patient/family/caregiver  Armando Reichert, MD [facility] 03/09/2022 6:37 PM    End of visit  medications:  No orders of the defined types were placed in this encounter.    Current Outpatient Medications:    Calcium Carb-Cholecalciferol 600-10 MG-MCG CHEW, Chew by mouth., Disp: , Rfl:    ciprofloxacin-dexamethasone (CIPRODEX) OTIC suspension, Place 4 drops into the left ear 2 (two) times daily., Disp: 7.5 mL, Rfl: 0   Cranberry-Vitamin C-Probiotic (AZO CRANBERRY PO), Take by mouth., Disp: , Rfl:    levothyroxine (SYNTHROID) 112 MCG tablet, Take 112 mcg by mouth daily., Disp: , Rfl:    lisinopril (ZESTRIL) 20 MG tablet, Take 2 tablets (40 mg total) by mouth daily., Disp: 180 tablet, Rfl: 3   timolol (TIMOPTIC) 0.5 % ophthalmic solution, Place 1 drop into both eyes daily., Disp: , Rfl:    Subjective:   PATIENT ID: Eileen Flores GENDER: female DOB: [birth year], MRN: [id number]  Chief Complaint  Patient presents with   pulmonary consult    CT 04/2021--SOB with exertion and occ dry cough.     HPI  The patient is a pleasant 87 year old female presenting to clinic for the evaluation of chest CT findings. She is referred by her primary care provider.  She reports being in her usual state of health and has minimal symptoms. Her symptoms mostly revolve around exertional dyspnea, and a rare cough. She is not very active and uses a walker to ambulate. Given she walks slowly, she doesn't feel her dyspnea much, unless she exerts herself. She has no fevers, no chills, no weight loss, no night sweats, and no rashes. She reports joint pains that are mostly in her knees, something she's dealt with for a long period of time. She does not report difficulty swallowing but does report symptoms of reflux. No rashes reported,  and no light sensitivity. No ulcers reported, and no easy bruising.  She was found to have radiographic pulmonary abnormalities on an abdominal CT in 2022, followed by a dedicated chest CT in March of 2023. She was recently seen by her PCP and is referred to [place] to discuss said  findings.  She lives in a mobile home and has lived there for the past 25 years. There is no basement. She is not aware of any water damage or leakage in the house, and has not seen any mold. She does not have any hot tubs or Jacuzzi. Water source is a well on their property, thou they do not drink from it; they drink bottled water. She does not have any birds, and uses cotton in her bedding; she is not aware of any dawn or feathers in her comforter/pillows. She does not have any family history of lung disease and no personal history of lung disease. She smoked socially in her teen years. She has no occupational exposures and worked as a Secretary/administrator in the past. She reports a history of UTI's for which she is on Mirabegron. She did receive Nitrofurantoin in September of 2023.  Ancillary information including prior medications, full medical/surgical/family/social histories, and PFTs (when available) are listed below and have been reviewed.   Review of Systems  Constitutional:  Negative for chills, diaphoresis, fever, malaise/fatigue and weight loss.  Respiratory:  Positive for cough and shortness of breath. Negative for hemoptysis and wheezing.   Cardiovascular:  Negative for chest pain and palpitations.  Genitourinary:  Positive for dysuria.  Musculoskeletal:  Positive for joint pain.  Skin:  Negative for itching and rash.     Objective:   Vitals:   03/09/22 1409  BP: 136/80  Pulse: 73  Temp: 97.9 F (36.6 C)  TempSrc: Temporal  SpO2: 94%  Weight: 196 lb 9.6 oz (89.2 kg)  Height: '5\' 8"'$  (1.727 m)   94% on RA  BMI Readings from Last 3 Encounters:  03/09/22 29.89 kg/m  02/25/22 29.19 kg/m  02/10/22 29.53 kg/m   Wt Readings from Last 3 Encounters:  03/09/22 196 lb 9.6 oz (89.2 kg)  02/25/22 192 lb (87.1 kg)  02/10/22 194 lb 3.2 oz (88.1 kg)    Physical Exam Constitutional:      General: She is not in acute distress.    Appearance: Normal appearance. She is not  ill-appearing.  HENT:     Head: Normocephalic.     Nose: Nose normal.     Mouth/Throat:     Mouth: Mucous membranes are moist.  Eyes:     Pupils: Pupils are equal, round, and reactive to light.  Cardiovascular:     Rate and Rhythm: Normal rate and regular rhythm.     Pulses: Normal pulses.     Heart sounds: Normal heart sounds.  Pulmonary:     Effort: Pulmonary effort is normal. No respiratory distress.     Breath sounds: Rales present. No wheezing.  Abdominal:     Palpations: Abdomen is soft.  Musculoskeletal:        General: Normal range of motion.  Skin:    General: Skin is warm.  Neurological:     General: No focal deficit present.     Mental Status: She is alert and oriented to person, place, and time. Mental status is at baseline.     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%; PR 90%; MammoSite January 2015, DECLINED ANTI-ESTROGEN TREATMENT   Mammographic microcalcification    Wears hearing aid      Family History  Problem Relation Age of Onset   Colon cancer Other    Colon polyps Other    Cancer Mother 31       liver cancer   Cancer Father 50       liver cancer   Alcohol abuse Father    Breast cancer Neg Hx      Past Surgical History:  Procedure Laterality Date   BREAST BIOPSY Left 2014   stereo biopsy   BREAST EXCISIONAL BIOPSY Right 2014   papilloma   BREAST LUMPECTOMY Left 2014   BREAST SURGERY Left Oct 2012   benign stereo biopsy   BREAST SURGERY Right 01-14-13   excision breast mass, intraductal papilloma   BREAST SURGERY Left 01-14-13   Excision  intra-papillary carcinoma, DCIS, ER 90%, PR 90%.   cataract surgery Bilateral 2009   COLONOSCOPY  2011   Dr. Bary Castilla   dermbrasion   1965   OVARIAN CYST REMOVAL  1967   POLYPECTOMY  2006   THYROIDECTOMY  2006   TONGUE SURGERY  2009    Social History   Socioeconomic History   Marital status: Widowed    Spouse name: Not on file   Number of children: 2   Years of education: Not on file   Highest education level: Some college, no degree  Occupational History   Not on file  Tobacco Use   Smoking status: Former    Types: Cigarettes   Smokeless tobacco: Never   Tobacco comments:    quit in mid to late 20's  Vaping Use   Vaping Use: Never used  Substance and Sexual Activity   Alcohol use: No    Comment: rare- 1 drink   Drug use: No   Sexual activity: Not on file  Other Topics Concern   Not on file  Social History Narrative   Not on file   Social Determinants of Health   Financial Resource Strain: Low Risk  (11/03/2021)   Overall Financial Resource Strain (CARDIA)    Difficulty of Paying Living Expenses: Not hard at all  Food Insecurity: No Food Insecurity (01/26/2022)   Hunger Vital Sign    Worried About Running Out of Food in the Last Year: Never true    [place] in the Last Year: Never true  Transportation Needs: No Transportation Needs (01/26/2022)   PRAPARE - Hydrologist (Medical): No    Lack of Transportation (Non-Medical): No  Physical Activity: Insufficiently Active (11/03/2021)   Exercise Vital Sign    Days of Exercise per Week: 3 days    Minutes of Exercise per Session: 30 min  Stress: No Stress Concern Present (11/03/2021)   Omega    Feeling of Stress : Only a little  Social Connections: Socially Isolated (11/03/2021)   Social Connection and Isolation Panel [NHANES]    Frequency of Communication with Friends and Family: Twice a  week    Frequency of  Social Gatherings with Friends and Family: Once a week    Attends Religious Services: Never    Marine scientist or Organizations: No    Attends Archivist Meetings: Never    Marital Status: Widowed  Intimate Partner Violence: Not At Risk (11/03/2021)   Humiliation, Afraid, Rape, and Kick questionnaire    Fear of Current or Ex-Partner: No    Emotionally Abused: No    Physically Abused: No    Sexually Abused: No     Allergies  Allergen Reactions   Penicillin G Rash   Penicillins Rash     CBC    Component Value Date/Time   WBC 15.3 (H) 12/06/2021 1117   WBC 12.9 (H) 12/02/2021 1604   RBC 4.20 12/06/2021 1117   RBC 4.81 12/02/2021 1604   HGB 13.1 12/06/2021 1117   HCT 38.5 12/06/2021 1117   PLT 306 12/06/2021 1117   MCV 92 12/06/2021 1117   MCV 93 07/07/2011 1555   MCH 31.2 12/06/2021 1117   MCH 30.8 12/02/2021 1604   MCHC 34.0 12/06/2021 1117   MCHC 32.1 12/02/2021 1604   RDW 11.8 12/06/2021 1117   RDW 13.6 07/07/2011 1555   LYMPHSABS 2.0 04/20/2021 1436   MONOABS 0.7 11/24/2020 1919   EOSABS 0.1 04/20/2021 1436   BASOSABS 0.1 04/20/2021 1436    Pulmonary Functions Testing Results:     No data to display          Outpatient Medications Prior to Visit  Medication Sig Dispense Refill   Calcium Carb-Cholecalciferol 600-10 MG-MCG CHEW Chew by mouth.     ciprofloxacin-dexamethasone (CIPRODEX) OTIC suspension Place 4 drops into the left ear 2 (two) times daily. 7.5 mL 0   Cranberry-Vitamin C-Probiotic (AZO CRANBERRY PO) Take by mouth.     levothyroxine (SYNTHROID) 112 MCG tablet Take 112 mcg by mouth daily.     lisinopril (ZESTRIL) 20 MG tablet Take 2 tablets (40 mg total) by mouth daily. 180 tablet 3   timolol (TIMOPTIC) 0.5 % ophthalmic solution Place 1 drop into both eyes daily.     No facility-administered medications prior to visit.

## 2022-03-09 NOTE — Patient Instructions (Signed)
Today, I ordered blood work. You can get them draw at your preferred [facility] draw station. The nearest one to [facility] is at nearby [place] ([address], [place], [place] [address]).

## 2022-03-14 ENCOUNTER — Encounter: Payer: Self-pay | Admitting: Urology

## 2022-03-16 ENCOUNTER — Ambulatory Visit
Admission: RE | Admit: 2022-03-16 | Discharge: 2022-03-16 | Disposition: A | Discharge status: Home / Self Care | Payer: Medicare HMO | Source: Ambulatory Visit | Attending: Student in an Organized Health Care Education/Training Program | Admitting: Student in an Organized Health Care Education/Training Program

## 2022-03-16 DIAGNOSIS — K219 Gastro-esophageal reflux disease without esophagitis: Secondary | ICD-10-CM | POA: Diagnosis not present

## 2022-03-16 DIAGNOSIS — J849 Interstitial pulmonary disease, unspecified: Secondary | ICD-10-CM | POA: Insufficient documentation

## 2022-03-16 DIAGNOSIS — R0602 Shortness of breath: Secondary | ICD-10-CM | POA: Insufficient documentation

## 2022-03-16 DIAGNOSIS — K449 Diaphragmatic hernia without obstruction or gangrene: Secondary | ICD-10-CM | POA: Diagnosis not present

## 2022-03-16 DIAGNOSIS — K224 Dyskinesia of esophagus: Secondary | ICD-10-CM | POA: Diagnosis not present

## 2022-03-16 NOTE — Addendum Note (Signed)
Addended byArmando Reichert on: 03/16/2022 05:14 PM   Modules accepted: Orders

## 2022-03-17 ENCOUNTER — Encounter: Payer: Self-pay | Admitting: Gastroenterology

## 2022-03-17 MED ORDER — MIRABEGRON ER 25 MG PO TB24: 25.0000 mg | ORAL_TABLET | Freq: Every day | ORAL | 11 refills | Status: DC

## 2022-03-21 ENCOUNTER — Ambulatory Visit
Admission: RE | Admit: 2022-03-21 | Discharge: 2022-03-21 | Disposition: A | Discharge status: Home / Self Care | Payer: Medicare HMO | Source: Ambulatory Visit | Attending: Family Medicine | Admitting: Family Medicine

## 2022-03-21 ENCOUNTER — Institutional Professional Consult (permissible substitution): Payer: Medicare HMO | Admitting: Student

## 2022-03-21 DIAGNOSIS — J84112 Idiopathic pulmonary fibrosis: Secondary | ICD-10-CM | POA: Diagnosis not present

## 2022-03-21 DIAGNOSIS — J849 Interstitial pulmonary disease, unspecified: Secondary | ICD-10-CM | POA: Insufficient documentation

## 2022-03-21 DIAGNOSIS — R0602 Shortness of breath: Secondary | ICD-10-CM | POA: Diagnosis not present

## 2022-03-21 DIAGNOSIS — R918 Other nonspecific abnormal finding of lung field: Secondary | ICD-10-CM | POA: Diagnosis not present

## 2022-03-21 DIAGNOSIS — I700 Atherosclerosis of aorta: Secondary | ICD-10-CM | POA: Diagnosis not present

## 2022-03-22 ENCOUNTER — Ambulatory Visit (INDEPENDENT_AMBULATORY_CARE_PROVIDER_SITE_OTHER): Payer: Medicare HMO | Admitting: Pulmonary Disease

## 2022-03-22 DIAGNOSIS — J849 Interstitial pulmonary disease, unspecified: Secondary | ICD-10-CM

## 2022-03-22 NOTE — Progress Notes (Signed)
   Interstitial Lung Disease Multidisciplinary Conference   Eileen GARCIAGARCIA    MRN [id number]    DOB November 03, 1935  Primary Care Physician:[name], [name], FNP  Referring Physician: Dr. Armando Reichert  Time of Conference: 7.30am- 8.30am Date of conference: 03/22/2022 Location of Conference: -  Virtual  Participating Pulmonary: Dr. Brand Males, MD,  Dr Marshell Garfinkel, MD Pathology:  Radiology: Dr Salvatore Marvel MD  Others:   Brief History:  44 F presenting for evaluation of abnormality on chest CT. History is unrevealing aside from joint pains and aches. I have ordered a full workup and would like to discuss next steps (bronch vs. steroids vs. other immune suppresion vs. biopsy)  CTD serologies 03/09/2022-negative   PFT     No data to display            MDD discussion of CT scan   HRCT: 03/21/2022  CT Chest: 04/27/2021  CT chest reviewed with mild pulmonary fibrosis with basal gradient, groundglass opacities with extensive air trapping.  Alternate diagnosis.  Suggestive of chronic HP  MDD Impression/Recs:  CT suggestive of alternate diagnosis with pattern suggesting of chronic HP.  CTD serologies are negative  Recommend to administer ILD exposure questionnaire and elicit exposure history. If there is convincing evidence of exposure then we can give diagnosis of chronic HP with moderate certainty and consider prednisone and exposure avoidance of.  If exposure history is doubtful then consider bronchoscopy with BAL +/- transbronchial biopsy to evaluate for leukocytosis and granulomas.   Time Spent in preparation and discussion:  > 30 min    SIGNATURE   [name] MD [facility] See Amion for pager  If no response to pager , please call [phone] until 7pm After 7:00 pm call Elink  [phone] 03/22/2022, 8:02 AM    ...................................................................................................................[name] References: Diagnosis of Hypersensitivity Pneumonitis in Adults. An Official ATS/JRS/ALAT Clinical Practice Guideline. Ragu G et al, [place] Aug 1;202(3):e36-e69.

## 2022-03-26 LAB — MYOMARKER 3 PLUS PROFILE (RDL)

## 2022-03-26 LAB — CBC WITH DIFFERENTIAL/PLATELET
Basophils Absolute: 0.1 10*3/uL (ref 0.0–0.2)
Basos: 1 %
EOS (ABSOLUTE): 0.2 10*3/uL (ref 0.0–0.4)
Eos: 2 %
Hematocrit: 43.5 % (ref 34.0–46.6)
Hemoglobin: 14.6 g/dL (ref 11.1–15.9)
Immature Grans (Abs): 0 10*3/uL (ref 0.0–0.1)
Immature Granulocytes: 0 %
Lymphocytes Absolute: 2.1 10*3/uL (ref 0.7–3.1)
Lymphs: 23 %
MCH: 31.1 pg (ref 26.6–33.0)
MCHC: 33.6 g/dL (ref 31.5–35.7)
MCV: 93 fL (ref 79–97)
Monocytes Absolute: 0.7 10*3/uL (ref 0.1–0.9)
Monocytes: 8 %
Neutrophils Absolute: 6.1 10*3/uL (ref 1.4–7.0)
Neutrophils: 66 %
Platelets: 339 10*3/uL (ref 150–450)
RBC: 4.7 x10E6/uL (ref 3.77–5.28)
RDW: 11.7 % (ref 11.7–15.4)
WBC: 9.1 10*3/uL (ref 3.4–10.8)

## 2022-03-26 LAB — HYPERSENSITIVITY PNEUMONITIS
A. Pullulans Abs: NEGATIVE
A.Fumigatus #1 Abs: NEGATIVE
Micropolyspora faeni, IgG: NEGATIVE
Pigeon Serum Abs: NEGATIVE
Thermoact. Saccharii: NEGATIVE
Thermoactinomyces vulgaris, IgG: NEGATIVE

## 2022-03-26 LAB — ANCA PROFILE
Anti-MPO Antibodies: <0.2 units (ref 0.0–0.9)
Anti-PR3 Antibodies: <0.2 units (ref 0.0–0.9)
Atypical pANCA: <1:20 {titer}
C-ANCA: <1:20 {titer}
P-ANCA: <1:20 {titer}

## 2022-03-26 LAB — CK: Total CK: 63 U/L (ref 26–161)

## 2022-03-26 LAB — ANTI-CCP AB, IGG + IGA (RDL): Anti-CCP Ab, IgG + IgA (RDL): <20 Units (ref <20)

## 2022-03-26 LAB — RHEUMATOID FACTOR: Rheumatoid fact SerPl-aCnc: <10 IU/mL (ref <14.0)

## 2022-03-26 LAB — ANTI-RO/NEG ANA: Anti-Ro (SS-A) Ab (RDL): <20 Units (ref <20)

## 2022-03-26 LAB — ANA 12 PLUS PROFILE (RDL): Anti-Nuclear Ab by IFA (RDL): NEGATIVE

## 2022-04-06 ENCOUNTER — Other Ambulatory Visit: Payer: Self-pay | Admitting: Family Medicine

## 2022-04-06 DIAGNOSIS — I10 Essential (primary) hypertension: Secondary | ICD-10-CM

## 2022-04-07 ENCOUNTER — Ambulatory Visit: Payer: Medicare HMO | Attending: Family Medicine

## 2022-04-07 DIAGNOSIS — R0602 Shortness of breath: Secondary | ICD-10-CM | POA: Insufficient documentation

## 2022-04-07 DIAGNOSIS — J849 Interstitial pulmonary disease, unspecified: Secondary | ICD-10-CM | POA: Insufficient documentation

## 2022-04-07 LAB — PULMONARY FUNCTION TEST ARMC ONLY
DL/VA % pred: 70 %
DL/VA: 2.76 ml/min/mmHg/L
DLCO unc % pred: 43 %
DLCO unc: 9.31 ml/min/mmHg
FEF 25-75 Pre: 1.59 L/sec
FEF2575-%Pred-Pre: 107 %
FEV1-%Pred-Pre: 74 %
FEV1-Pre: 1.68 L
FEV1FVC-%Pred-Pre: 99 %
FEV6-%Pred-Pre: 72 %
FEV6-Pre: 2.06 L
FEV6FVC-%Pred-Pre: 105 %
FVC-%Pred-Pre: 76 %
FVC-Pre: 2.3 L
Pre FEV1/FVC ratio: 73 %
Pre FEV6/FVC Ratio: 100 %
RV % pred: 89 %
RV: 2.42 L
TLC % pred: 75 %
TLC: 4.31 L

## 2022-04-12 ENCOUNTER — Other Ambulatory Visit: Payer: Medicare HMO

## 2022-04-13 ENCOUNTER — Ambulatory Visit: Payer: Medicare HMO | Admitting: Gastroenterology

## 2022-04-15 ENCOUNTER — Other Ambulatory Visit: Payer: Self-pay

## 2022-04-15 ENCOUNTER — Telehealth: Payer: Self-pay

## 2022-04-15 ENCOUNTER — Emergency Department: Payer: Medicare HMO

## 2022-04-15 ENCOUNTER — Emergency Department
Admission: EM | Admit: 2022-04-15 | Discharge: 2022-04-16 | Disposition: A | Discharge status: Home / Self Care | Payer: Medicare HMO | Attending: Emergency Medicine | Admitting: Emergency Medicine

## 2022-04-15 DIAGNOSIS — R55 Syncope and collapse: Secondary | ICD-10-CM | POA: Diagnosis not present

## 2022-04-15 DIAGNOSIS — I10 Essential (primary) hypertension: Secondary | ICD-10-CM | POA: Diagnosis not present

## 2022-04-15 DIAGNOSIS — R918 Other nonspecific abnormal finding of lung field: Secondary | ICD-10-CM | POA: Diagnosis not present

## 2022-04-15 DIAGNOSIS — R519 Headache, unspecified: Secondary | ICD-10-CM | POA: Diagnosis not present

## 2022-04-15 LAB — BASIC METABOLIC PANEL
Anion gap: 7 (ref 5–15)
BUN: 24 mg/dL — ABNORMAL HIGH (ref 8–23)
CO2: 27 mmol/L (ref 22–32)
Calcium: 8.8 mg/dL — ABNORMAL LOW (ref 8.9–10.3)
Chloride: 105 mmol/L (ref 98–111)
Creatinine, Ser: 0.92 mg/dL (ref 0.44–1.00)
GFR, Estimated: >60 mL/min (ref >60)
Glucose, Bld: 128 mg/dL — ABNORMAL HIGH (ref 70–99)
Potassium: 3.9 mmol/L (ref 3.5–5.1)
Sodium: 139 mmol/L (ref 135–145)

## 2022-04-15 LAB — URINALYSIS, ROUTINE W REFLEX MICROSCOPIC
Bacteria, UA: NONE SEEN
Bilirubin Urine: NEGATIVE
Glucose, UA: NEGATIVE mg/dL
Ketones, ur: NEGATIVE mg/dL
Nitrite: NEGATIVE
Protein, ur: NEGATIVE mg/dL
Specific Gravity, Urine: 1.006 (ref 1.005–1.030)
pH: 5 (ref 5.0–8.0)

## 2022-04-15 LAB — CBC
HCT: 42.2 % (ref 36.0–46.0)
Hemoglobin: 13.6 g/dL (ref 12.0–15.0)
MCH: 30.8 pg (ref 26.0–34.0)
MCHC: 32.2 g/dL (ref 30.0–36.0)
MCV: 95.7 fL (ref 80.0–100.0)
Platelets: 311 10*3/uL (ref 150–400)
RBC: 4.41 MIL/uL (ref 3.87–5.11)
RDW: 12.8 % (ref 11.5–15.5)
WBC: 8.8 10*3/uL (ref 4.0–10.5)
nRBC: 0 % (ref 0.0–0.2)

## 2022-04-15 LAB — TROPONIN I (HIGH SENSITIVITY): Troponin I (High Sensitivity): 7 ng/L (ref <18)

## 2022-04-15 MED ORDER — IOHEXOL 350 MG/ML SOLN
75.0000 mL | Freq: Once | INTRAVENOUS | Status: AC | PRN
  Administered 2022-04-16: 75 mL via INTRAVENOUS

## 2022-04-15 MED ORDER — IOHEXOL 350 MG/ML SOLN: 100.0000 mL | Freq: Once | INTRAVENOUS | Status: DC | PRN

## 2022-04-15 NOTE — ED Triage Notes (Signed)
Pt presents to ER from home reports forgot to take her medication today and called her Urologist office and was directed to take her BP and was 175/60, nurse from office directed pt to come to ER. Pt reports last time she forgot to take her medication she passed out. Pt denies any pain at present, pt reports around 7:30pm she felt dizzy. Pt talks in complete sentences. No respiratory distress noted

## 2022-04-15 NOTE — Discharge Instructions (Addendum)
You should call your urologist and make a follow-up appointment to discuss this medication but you have been on this for many months so I am not sure if this is even related to that medication or not.  To continue to take your medication and keep a journal if you start developing any symptoms.  We have also referred you to a cardiologist to make sure you get an echocardiogram of your heart and discuss further with them.  If you develop recurrent syncope or passing out then you need to return to the ER immediately for repeat evaluation

## 2022-04-15 NOTE — Telephone Encounter (Signed)
Copied from [place] [phone]. Topic: General - Other >> Apr 15, 2022  2:34 PM Santiya F wrote: Reason for CRM: Pt is calling in because she cancelled some appointments on 04/17 for a mammogram and bone density test along with a Gastro appointment on 04/22. Pt says she wants to continue working with her pulmonary doctor the time being and will get with her PCP to get those appointments rescheduled at a later date. Pt would like Daneil Dan or her nurse to follow up with her.

## 2022-04-15 NOTE — ED Provider Notes (Signed)
[facility] Provider Note    Event Date/Time   First MD Initiated Contact with Patient 04/15/22 2143     (approximate)   History   Hypertension   HPI  Eileen Flores is a 87 y.o. female with known hypertension who comes in with concerns for concern for episode of some dizziness, lightheadedness.  Patient reports a few nights ago she forgot to take her medication for her bladder Myrbetriq.  She reports that night she had an episode of diarrhea and then she went to go stand up and start walking and she passed out.  She denies hitting her head.  She thought it was related to not taking her medication.  She states that today she rec take her medicine at 230 and so tonight around 730 she started feeling anxious about not taking her medicine and felt like maybe she was going to pass out again.  She reports she did not pass out she just felt worried that she was going to.  She reported a little bit of dizziness but she is not sure if that was just related to the anxiety of feeling like she may pass out.  She denies any chest pain but does report some increasing shortness of breath with the past few months although is being worked up for pulmonary fibrosis.  She currently denies any symptoms.   Physical Exam   Triage Vital Signs: ED Triage Vitals  Enc Vitals Group     BP 04/15/22 2128 (!) 184/77     Pulse Rate 04/15/22 2128 73     Resp 04/15/22 2128 16     Temp 04/15/22 2128 98.6 F (37 C)     Temp Source 04/15/22 2128 Oral     SpO2 04/15/22 2128 94 %     Weight 04/15/22 2129 195 lb (88.5 kg)     Height 04/15/22 2129 '5\' 9"'$  (1.753 m)     Head Circumference --      Peak Flow --      Pain Score 04/15/22 2129 0     Pain Loc --      Pain Edu? --      Excl. in [place]? --     Most recent vital signs: Vitals:   04/15/22 2128  BP: (!) 184/77  Pulse: 73  Resp: 16  Temp: 98.6 F (37 C)  SpO2: 94%     General: Awake, no distress.  CV:  Good peripheral perfusion.   Resp:  Normal effort.  Abd:  No distention.  Soft nontender Other:  Cranial nerves are intact.  Equal strength in arms and legs.  Finger-to-nose intact.  Patient reports ambulating to the bathroom without any issues while in the ED    ED Results / Procedures / Treatments   Labs (all labs ordered are listed, but only abnormal results are displayed) Labs Reviewed  BASIC METABOLIC PANEL  CBC  URINALYSIS, ROUTINE W REFLEX MICROSCOPIC  CBG MONITORING, ED     EKG  My interpretation of EKG:  Sinus rate of 75 without any ST elevations or T wave inversions except for aVL but does have left bundle branch block with occasional PAC.  I reviewed her prior EKGs and she had similar left bundle branch block  RADIOLOGY Pending  PROCEDURES:  Critical Care performed: No  .1-3 Lead EKG Interpretation  Performed by: Vanessa Stanfield, MD Authorized by: Vanessa Rosalia, MD     Interpretation: normal     ECG rate:  70   ECG rate assessment: normal     Rhythm: sinus rhythm     Ectopy: PAC     Conduction: normal      MEDICATIONS ORDERED IN ED: Medications - No data to display   IMPRESSION / MDM / [place] / ED COURSE  I reviewed the triage vital signs and the nursing notes.   Patient's presentation is most consistent with acute presentation with potential threat to life or bodily function.   Patient comes in with some near syncope/dizziness tonight where she felt like she may pass out but she reports that she was also just really anxious about not taking the medication and she was not sure given she thought it was why she passed out last time that it may happen again and she was not sure if she just take the medication or not after and then a few hours later.  Discussed with patient that the main side effects of this medications are headache elevated blood pressure and dizziness and if she missed the medication and that exact opposite to what I would have expected.  I did discuss  the case with pharmacy and they stated that it was okay for her to miss a dose that does not cause any rebound symptoms for a single missed dose.  We will get labs, cardiac markers given patient's age to rule out ACS given she is slightly hypoxic at 94% and increasing shortness of breath will get CT PE to rule out pulmonary embolism and CT head to rule out any intracranial hemorrhage given she is hypertensive but on neurological exam it appears normal.  She denies any continued dizziness to suggest posterior stroke.  She is ambulatory without symptoms.  I offered patient admission for cardiac monitoring, echocardiogram given the concern for syncopal episode in the elderly but given this happened a few days ago and she reports that today was more just feeling anxious about it happening again she would prefer to follow-up outpatient which I think is reasonable.  She however does understand that if she does pass out again she would need to return to the emergency room.  Patient was handed off to oncoming team pending labs and CT imaging  The patient is on the cardiac monitor to evaluate for evidence of arrhythmia and/or significant heart rate changes.      FINAL CLINICAL IMPRESSION(S) / ED DIAGNOSES   Final diagnoses:  Near syncope     Rx / DC Orders   ED Discharge Orders          Ordered    Ambulatory referral to Cardiology        04/15/22 2336             Note:  This document was prepared using Dragon voice recognition software and may include unintentional dictation errors.   Vanessa Laurel, MD 04/15/22 [phone]

## 2022-04-16 DIAGNOSIS — R918 Other nonspecific abnormal finding of lung field: Secondary | ICD-10-CM | POA: Diagnosis not present

## 2022-04-16 DIAGNOSIS — R519 Headache, unspecified: Secondary | ICD-10-CM | POA: Diagnosis not present

## 2022-04-16 LAB — TROPONIN I (HIGH SENSITIVITY): Troponin I (High Sensitivity): 11 ng/L (ref <18)

## 2022-04-18 ENCOUNTER — Telehealth: Payer: Self-pay | Admitting: *Deleted

## 2022-04-18 ENCOUNTER — Telehealth: Payer: Self-pay

## 2022-04-18 NOTE — Transitions of Care (Post Inpatient/ED Visit) (Signed)
   04/18/2022  Name: Eileen Flores MRN: [id number] DOB: October 21, 1935  Today's TOC FU Call Status: Today's TOC FU Call Status:: Successful TOC FU Call Competed TOC FU Call Complete Date: 04/18/22  Transition Care Management Follow-up Telephone Call Date of Discharge: 04/16/22 Discharge Facility: [facility]) Type of Discharge: Emergency Department (emmi red  alert) Reason for ED Visit: Other: (syncope episode) How have you been since you were released from the hospital?: Better Any questions or concerns?: No  Items Reviewed: Did you receive and understand the discharge instructions provided?: Yes Medications obtained and verified?: Yes (Medications Reviewed) Any new allergies since your discharge?: No Dietary orders reviewed?: No Do you have support at home?: Yes People in Home: alone Name of Support/Comfort Primary Source: sons  [place] and Equipment/Supplies: [place] Ordered?: No Any new equipment or medical supplies ordered?: No  Functional Questionnaire: Do you need assistance with bathing/showering or dressing?: No Do you need assistance with meal preparation?: No Do you need assistance with eating?: No Do you have difficulty maintaining continence: No Do you need assistance with getting out of bed/getting out of a chair/moving?: No Do you have difficulty managing or taking your medications?: No  Folllow up appointments reviewed: PCP Follow-up appointment confirmed?: Yes Date of PCP follow-up appointment?: 04/19/22 Follow-up Provider: Valentino Saxon [facility] Follow-up appointment confirmed?: Yes Date of Specialist follow-up appointment?: 04/20/22 Follow-Up Specialty Provider:: Pulmonology 2:00, Patient will call for an appointment with cardiology Do you need transportation to your follow-up appointment?: No Do you understand care options if your condition(s) worsen?: Yes-patient verbalized understanding  SDOH  Interventions Today    Flowsheet Row Most Recent Value  SDOH Interventions   Food Insecurity Interventions Intervention Not Indicated  Housing Interventions Intervention Not Indicated  Transportation Interventions Intervention Not Indicated      Interventions Today    Flowsheet Row Most Recent Value  General Interventions   General Interventions Discussed/Reviewed General Interventions Discussed, General Interventions Reviewed, Doctor Visits  Doctor Visits Discussed/Reviewed Doctor Visits Discussed, Doctor Visits Reviewed, Specialist  PCP/Specialist Visits Compliance with follow-up visit         [place] [facility] [phone]

## 2022-04-18 NOTE — Telephone Encounter (Signed)
LVMTCB. CRM created. Ok for PEC to advise 

## 2022-04-18 NOTE — Telephone Encounter (Signed)
Pt called back. She was unsure of what calls had been about. PT was seen at ED over the weekend. Made appt for hospital  follow up.

## 2022-04-19 ENCOUNTER — Ambulatory Visit (INDEPENDENT_AMBULATORY_CARE_PROVIDER_SITE_OTHER): Payer: Medicare HMO | Admitting: Physician Assistant

## 2022-04-19 VITALS — BP 153/65 | HR 69 | Temp 97.6°F | Wt 197.0 lb

## 2022-04-19 DIAGNOSIS — R55 Syncope and collapse: Secondary | ICD-10-CM | POA: Diagnosis not present

## 2022-04-19 DIAGNOSIS — I10 Essential (primary) hypertension: Secondary | ICD-10-CM

## 2022-04-19 DIAGNOSIS — Z09 Encounter for follow-up examination after completed treatment for conditions other than malignant neoplasm: Secondary | ICD-10-CM | POA: Diagnosis not present

## 2022-04-19 DIAGNOSIS — R918 Other nonspecific abnormal finding of lung field: Secondary | ICD-10-CM | POA: Diagnosis not present

## 2022-04-19 DIAGNOSIS — J984 Other disorders of lung: Secondary | ICD-10-CM | POA: Diagnosis not present

## 2022-04-19 NOTE — Patient Instructions (Signed)
You have an upcoming appointment scheduled on 06/10/22 with Kate Sable, MD at 2:00 pm due to youre referral placed to cardiology on 04/15/22. If this date or time does not work for you please contact their office. Contact information is listed below with hours of operation.  [address], [place], [place] [address] Phone number [phone] Sunday: Closed Monday: 8:00 AM - 5:00 PM Tuesday: 8:00 AM - 5:00 PM Wednesday: 8:00 AM - 5:00 PM Thursday: 8:00 AM - 5:00 PM Friday: 8:00 AM - 5:00 PM Saturday: Closed

## 2022-04-19 NOTE — Progress Notes (Unsigned)
I,Sha'taria Tyson,acting as a Education administrator for [employer], PA-C.,have documented all relevant documentation on the behalf of [name], PA-C,as directed by  [employer], PA-C while in the presence of [employer], PA-C.   Established patient visit   Patient: Eileen Flores   DOB: 12-14-1935   87 y.o. Female  MRN: [id number] Visit Date: 04/19/2022  Today's healthcare provider: [name], PA-C   CC: hospital FU  Subjective    HPI  Follow up Hospitalization  Patient was admitted to [facility] on 04/15/22 and discharged on 04/16/22. She was treated for near syncope. Treatment for this included patient is on the cardiac monitor to evaluate for evidence of arrhythmia and/or significant heart rate changes. Ambulatory referral sent to cardiology. Telephone follow up was done on 04/18/22 She reports good compliance with treatment. She reports this condition is improved. Cardio appointment set for 06/10/22 with Kate Sable, MD.  ----------------------------------------------------------------------------------------- -   Medications: Outpatient Medications Prior to Visit  Medication Sig   Calcium Carb-Cholecalciferol 600-10 MG-MCG CHEW Chew by mouth.   ciprofloxacin-dexamethasone (CIPRODEX) OTIC suspension Place 4 drops into the left ear 2 (two) times daily.   Cranberry-Vitamin C-Probiotic (AZO CRANBERRY PO) Take by mouth.   levothyroxine (SYNTHROID) 112 MCG tablet Take 112 mcg by mouth daily.   lisinopril (ZESTRIL) 20 MG tablet Take 2 tablets (40 mg total) by mouth daily.   mirabegron ER (MYRBETRIQ) 25 MG TB24 tablet Take 1 tablet (25 mg total) by mouth daily.   timolol (TIMOPTIC) 0.5 % ophthalmic solution Place 1 drop into both eyes daily.   No facility-administered medications prior to visit.    Review of Systems  {Labs  Heme  Chem  Endocrine  Serology  Results Review (optional):23779}   Objective    BP (!) 155/77 (BP Location: Left Arm, Patient Position:  Sitting, Cuff Size: Normal)   Pulse 69   Temp 97.6 F (36.4 C) (Oral)   Wt 197 lb (89.4 kg)   SpO2 99%   BMI 29.09 kg/m   Vitals:   04/19/22 1352 04/19/22 1355  BP: (!) 155/77 (!) 153/65  Pulse: 69   Temp: 97.6 F (36.4 C)   TempSrc: Oral   SpO2: 99%   Weight: 197 lb (89.4 kg)     Physical Exam Vitals reviewed.  Constitutional:      General: She is not in acute distress.    Appearance: Normal appearance. She is well-developed. She is not diaphoretic.  HENT:     Head: Normocephalic and atraumatic.  Eyes:     General: No scleral icterus.    Conjunctiva/sclera: Conjunctivae normal.  Neck:     Thyroid: No thyromegaly.  Cardiovascular:     Rate and Rhythm: Normal rate and regular rhythm.     Pulses: Normal pulses.     Heart sounds: Normal heart sounds. No murmur heard. Pulmonary:     Effort: Pulmonary effort is normal. No respiratory distress.     Breath sounds: Normal breath sounds. No wheezing, rhonchi or rales.  Musculoskeletal:     Cervical back: Neck supple.     Right lower leg: No edema.     Left lower leg: No edema.  Lymphadenopathy:     Cervical: No cervical adenopathy.  Skin:    General: Skin is warm and dry.     Findings: No rash.  Neurological:     Mental Status: She is alert and oriented to person, place, and time. Mental status is at baseline.  Psychiatric:  Behavior: Behavior normal.        Thought Content: Thought content normal.        Judgment: Judgment normal.      No results found for any visits on 04/19/22.  [facility] discharge follow-up Primary hypertension Near syncope Seen at [facility] on 3.8.24, cardiac markers ruled out  Pneumonitis ***  Pulmonary nodules ***    No follow-ups on file.      The patient was advised to call back or seek an in-person evaluation if the symptoms worsen or if the condition fails to improve as anticipated.  I discussed the assessment and treatment plan with the patient. The  patient was provided an opportunity to ask questions and all were answered. The patient agreed with the plan and demonstrated an understanding of the instructions.  I, [name], PA-C have reviewed all documentation for this visit. The documentation on 04/19/22  for the exam, diagnosis, procedures, and orders are all accurate and complete.  [name], [facility], [place] [phone] (phone) [phone] (fax)   [place]

## 2022-04-20 ENCOUNTER — Ambulatory Visit: Payer: Medicare HMO | Admitting: Student in an Organized Health Care Education/Training Program

## 2022-04-20 ENCOUNTER — Encounter: Payer: Self-pay | Admitting: Student in an Organized Health Care Education/Training Program

## 2022-04-20 ENCOUNTER — Telehealth: Payer: Self-pay

## 2022-04-20 VITALS — BP 142/92 | HR 57 | Temp 96.9°F | Ht 69.0 in | Wt 197.0 lb

## 2022-04-20 DIAGNOSIS — R0602 Shortness of breath: Secondary | ICD-10-CM | POA: Diagnosis not present

## 2022-04-20 DIAGNOSIS — J849 Interstitial pulmonary disease, unspecified: Secondary | ICD-10-CM

## 2022-04-20 NOTE — Progress Notes (Signed)
Synopsis: Referred in for ILD by [name], FNP  Assessment & Plan:   1. ILD (interstitial lung disease) ([place]) 2. Shortness of breath  Presenting for the evaluation of findings on her chest CT from 2023 that is concerning for ILD. The differential for the findings, while not a high resolution CT, includes NSIP and HSP. The ILD board recommendation favors hypersensitivity pneumonitis. Workup has included auto-immune and CTD testing (negative ANA, negative myositis workup, negative vasculitis workup), a high resolution chest CT, esophagogram (no aspiration/reflux), and a negative hypersensitivity pneumonitis panel.  Given all negative workup, and the imaging suggestive of HSP, will administer the ILD questionnaire and have encouraged the patient to look for any mold or water damage in the house. I did discuss with her our options of bronchoscopy vs empiric course of prednisone. She will fill the forms and call us next week to discuss next steps after she's had time to think about it.  Return in about 3 months (around 07/21/2022).  I spent 30 minutes caring for this patient today, including preparing to see the patient, obtaining a medical history , reviewing a separately obtained history, performing a medically appropriate examination and/or evaluation, counseling and educating the patient/family/caregiver, ordering medications, tests, or procedures, documenting clinical information in the electronic health record, and independently interpreting results (not separately reported/billed) and communicating results to the patient/family/caregiver  Armando Reichert, MD [facility] 04/20/2022 4:39 PM    End of visit medications:  No orders of the defined types were placed in this encounter.    Current Outpatient Medications:    Calcium Carb-Cholecalciferol 600-10 MG-MCG CHEW, Chew by mouth., Disp: , Rfl:    ciprofloxacin-dexamethasone (CIPRODEX) OTIC suspension, Place 4  drops into the left ear 2 (two) times daily., Disp: 7.5 mL, Rfl: 0   conjugated estrogens (PREMARIN) vaginal cream, Place 1 applicator vaginally. 3 times a week, Disp: , Rfl:    Cranberry-Vitamin C-Probiotic (AZO CRANBERRY PO), Take by mouth., Disp: , Rfl:    levothyroxine (SYNTHROID) 112 MCG tablet, Take 112 mcg by mouth daily., Disp: , Rfl:    lisinopril (ZESTRIL) 20 MG tablet, Take 2 tablets (40 mg total) by mouth daily., Disp: 180 tablet, Rfl: 3   mirabegron ER (MYRBETRIQ) 25 MG TB24 tablet, Take 1 tablet (25 mg total) by mouth daily., Disp: 30 tablet, Rfl: 11   timolol (TIMOPTIC) 0.5 % ophthalmic solution, Place 1 drop into both eyes daily., Disp: , Rfl:    Subjective:   PATIENT ID: Eileen Flores GENDER: female DOB: 1935/08/24, MRN: [id number]  Chief Complaint  Patient presents with   Follow-up    SOB with exertion. Cough with yellowish sputum. No wheezing.    HPI  The patient is a pleasant 87 year old female presenting to clinic for follow up on Chest CT findings consistent with ILD.  There has been no change in symptoms since her last visit. She is not experiencing any symptoms, and is mostly short of breath with exertion. She doesn't exert herself much, however. I first saw Mr. Alfr3ed in January of 2023 for an initial visit.  At that time, she reported being in her usual state of health. Her symptoms mostly revolved around exertional dyspnea, and a rare cough. She is not very active and uses a walker to ambulate. Given she walks slowly, she doesn't feel much dyspnea. She has no fevers, no chills, no weight loss, no night sweats, and no rashes. She reports joint pains that are mostly in  her knees, something she's dealt with for a long period of time. She does not report difficulty swallowing but does report symptoms of reflux. No rashes reported, and no light sensitivity. No ulcers reported, and no easy bruising.   She was found to have radiographic pulmonary abnormalities on an  abdominal CT in 2022, followed by a dedicated chest CT in March of 2023. Following our initial visit, we did discuss her images and history at the multi-disciplinary ILD board, and the recommendation was to administer the ILD questionnaire and consider flexible bronchoscopy with transbronchial biopsies.   She lives in a mobile home and has lived there for the past 25 years. She does have old carpet in her bedroom. There is no basement. She is not aware of any water damage or leakage in the house, and has not seen any mold. She does not have any hot tubs or Jacuzzi. Water source is a well on their property, thou they do not drink from it; they drink bottled water. She does not have any birds, and uses cotton in her bedding; she is not aware of any dawn or feathers in her comforter/pillows. She does not have any family history of lung disease and no personal history of lung disease. She smoked socially in her teen years. She has no occupational exposures and worked as a Secretary/administrator in the past. She reports a history of UTI's for which she is on Mirabegron. She did receive Nitrofurantoin in September of 2023.  Ancillary information including prior medications, full medical/surgical/family/social histories, and PFTs (when available) are listed below and have been reviewed.   Review of Systems  Constitutional:  Negative for chills, diaphoresis, fever, malaise/fatigue and weight loss.  Respiratory:  Positive for shortness of breath. Negative for cough, hemoptysis and wheezing.   Cardiovascular:  Negative for chest pain and palpitations.  Genitourinary:  Positive for dysuria.  Musculoskeletal:  Positive for joint pain.  Skin:  Negative for itching and rash.     Objective:   Vitals:   04/20/22 1401  BP: (!) 142/92  Pulse: (!) 57  Temp: (!) 96.9 F (36.1 C)  SpO2: 98%  Weight: 197 lb (89.4 kg)  Height: '5\' 9"'$  (1.753 m)   98% on RA  BMI Readings from Last 3 Encounters:  04/20/22 29.09 kg/m   04/19/22 29.09 kg/m  04/15/22 28.80 kg/m   Wt Readings from Last 3 Encounters:  04/20/22 197 lb (89.4 kg)  04/19/22 197 lb (89.4 kg)  04/15/22 195 lb (88.5 kg)    Physical Exam Constitutional:      General: She is not in acute distress.    Appearance: Normal appearance. She is not ill-appearing.  HENT:     Head: Normocephalic.     Nose: Nose normal.     Mouth/Throat:     Mouth: Mucous membranes are moist.  Eyes:     Pupils: Pupils are equal, round, and reactive to light.  Cardiovascular:     Rate and Rhythm: Normal rate and regular rhythm.     Pulses: Normal pulses.     Heart sounds: Normal heart sounds.  Pulmonary:     Effort: Pulmonary effort is normal. No respiratory distress.     Breath sounds: Rales present. No wheezing.  Abdominal:     Palpations: Abdomen is soft.  Musculoskeletal:        General: Normal range of motion.  Skin:    General: Skin is warm.  Neurological:     General: No focal deficit present.  Mental Status: She is alert and oriented to person, place, and time. Mental status is at baseline.       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%; PR 90%; MammoSite January 2015, DECLINED ANTI-ESTROGEN TREATMENT   Mammographic microcalcification    Wears hearing aid      Family History  Problem Relation Age of Onset   Colon cancer Other    Colon polyps Other    Cancer Mother 7       liver cancer   Cancer Father 13       liver cancer   Alcohol abuse Father    Breast cancer Neg Hx      Past Surgical History:  Procedure Laterality Date   BREAST  BIOPSY Left 2014   stereo biopsy   BREAST EXCISIONAL BIOPSY Right 2014   papilloma   BREAST LUMPECTOMY Left 2014   BREAST SURGERY Left Oct 2012   benign stereo biopsy   BREAST SURGERY Right 01-14-13   excision breast mass, intraductal papilloma   BREAST SURGERY Left 01-14-13   Excision intra-papillary carcinoma, DCIS, ER 90%, PR 90%.   cataract surgery Bilateral 2009   COLONOSCOPY  2011   Dr. Bary Castilla   dermbrasion   1965   OVARIAN CYST REMOVAL  1967   POLYPECTOMY  2006   THYROIDECTOMY  2006   TONGUE SURGERY  2009    Social History   Socioeconomic History   Marital status: Widowed    Spouse name: Not on file   Number of children: 2   Years of education: Not on file   Highest education level: Some college, no degree  Occupational History   Not on file  Tobacco Use   Smoking status: Former    Types: Cigarettes   Smokeless tobacco: Never   Tobacco comments:    quit in mid to late 20's  Vaping Use   Vaping Use: Never used  Substance and Sexual Activity   Alcohol use: No    Comment: rare- 1 drink   Drug use: No   Sexual activity: Not on file  Other Topics Concern   Not on file  Social History Narrative   Not on file   Social Determinants of Health   Financial Resource Strain: Low Risk  (11/03/2021)   Overall Financial Resource Strain (CARDIA)    Difficulty of Paying Living Expenses: Not hard at all  Food Insecurity: No Food Insecurity (04/18/2022)   Hunger Vital Sign    Worried About Running Out of Food in the Last Year: Never true    [place] in the Last Year: Never true  Transportation Needs: No Transportation Needs (04/18/2022)   PRAPARE - Hydrologist (Medical): No    Lack of Transportation (Non-Medical): No  Physical Activity: Insufficiently Active (11/03/2021)   Exercise Vital Sign    Days of Exercise per Week: 3 days    Minutes of Exercise per Session: 30 min  Stress: No Stress Concern Present (11/03/2021)   [place]  Institute of Occupational Health - Occupational Stress Questionnaire    Feeling of Stress : Only a little  Social Connections: Socially Isolated (11/03/2021)   Social Connection and Isolation Panel [NHANES]    Frequency of Communication with Friends and Family: Twice a week    Frequency of Social Gatherings with Friends and Family: Once a week    Attends Religious Services: Never    Marine scientist or Organizations: No    Attends Archivist Meetings: Never    Marital Status: Widowed  Intimate Partner Violence: Not At Risk (11/03/2021)   Humiliation, Afraid, Rape, and Kick questionnaire    Fear of Current or Ex-Partner: No    Emotionally Abused: No    Physically Abused: No    Sexually Abused: No     Allergies  Allergen Reactions   Penicillin G Rash   Penicillins Rash     CBC    Component Value Date/Time   WBC 8.8 04/15/2022 2241   RBC 4.41 04/15/2022 2241   HGB 13.6 04/15/2022 2241   HGB 14.6 03/09/2022 1624   HCT 42.2 04/15/2022 2241   HCT 43.5 03/09/2022 1624   PLT 311 04/15/2022 2241   PLT 339 03/09/2022 1624   MCV 95.7 04/15/2022 2241   MCV 93 03/09/2022 1624   MCV 93 07/07/2011 1555   MCH 30.8 04/15/2022 2241   MCHC 32.2 04/15/2022 2241   RDW 12.8 04/15/2022 2241   RDW 11.7 03/09/2022 1624   RDW 13.6 07/07/2011 1555   LYMPHSABS 2.1 03/09/2022 1624   MONOABS 0.7 11/24/2020 1919   EOSABS 0.2 03/09/2022 1624   BASOSABS 0.1 03/09/2022 1624    Pulmonary Functions Testing Results:    Latest Ref Rng & Units 04/07/2022    2:56 PM  PFT Results  FVC-Pre L 2.30   FVC-Predicted Pre % 76   Pre FEV1/FVC % % 73   FEV1-Pre L 1.68   FEV1-Predicted Pre % 74   DLCO uncorrected ml/min/mmHg 9.31   DLCO UNC% % 43   DLVA Predicted % 70   TLC L 4.31   TLC % Predicted % 75   RV % Predicted % 89     Outpatient Medications Prior to Visit  Medication Sig Dispense Refill   Calcium Carb-Cholecalciferol 600-10 MG-MCG CHEW Chew by mouth.      ciprofloxacin-dexamethasone (CIPRODEX) OTIC suspension Place 4 drops into the left ear 2 (two) times daily. 7.5 mL 0   conjugated estrogens (PREMARIN) vaginal cream Place 1 applicator vaginally. 3 times a week     Cranberry-Vitamin C-Probiotic (AZO CRANBERRY PO) Take by mouth.     levothyroxine (SYNTHROID) 112 MCG tablet Take 112 mcg by mouth daily.     lisinopril (ZESTRIL) 20 MG tablet Take 2 tablets (40 mg total) by mouth daily. 180 tablet 3   mirabegron ER (MYRBETRIQ) 25 MG TB24 tablet Take 1 tablet (25 mg total) by mouth daily. 30 tablet 11   timolol (TIMOPTIC) 0.5 % ophthalmic solution Place 1 drop into both eyes daily.     No facility-administered medications prior to visit.

## 2022-04-20 NOTE — Telephone Encounter (Signed)
     Patient  visit on 3/9  at [facility]    Have you been able to follow up with your primary care physician? Yes   The patient was or was not able to obtain any needed medicine or equipment. Yes   Are there diet recommendations that you are having difficulty following? Na   Patient expresses understanding of discharge instructions and education provided has no other needs at this time.  Yes      Ikea Demicco [facility], [place] [phone] [address], [place], [place] [address] Phone: [phone] Email: [email or web address]    

## 2022-04-21 ENCOUNTER — Encounter: Payer: Self-pay | Admitting: Physician Assistant

## 2022-04-21 DIAGNOSIS — R55 Syncope and collapse: Secondary | ICD-10-CM | POA: Insufficient documentation

## 2022-04-22 ENCOUNTER — Telehealth: Payer: Self-pay | Admitting: Family Medicine

## 2022-04-22 DIAGNOSIS — H6123 Impacted cerumen, bilateral: Secondary | ICD-10-CM

## 2022-04-22 NOTE — Telephone Encounter (Signed)
Patient requesting to be seen on Monday 04/25/22 by Dr. Charolett Bumpers to have ears cleaned out. I advised patient that I can place an ENT referral today. However, it will not get done today. And mostly likely ENT/Dr. Charolett Bumpers would not be able to see her Monday. I did offer to appt in office to have ears cleaned out. She reports she did not tolerate the procedure we use.

## 2022-04-22 NOTE — Telephone Encounter (Signed)
Pt is calling to request a referral to ENT to have her Ears cleaned. Pt follow up with Ashok Norris was advised that her ears need to be cleaned. Pt goes to [facility]- but was advised that the soonest appt was next Friday. Pt is looking for something sooner. Please advise CB- [phone]

## 2022-05-06 ENCOUNTER — Other Ambulatory Visit: Payer: Self-pay | Admitting: Family Medicine

## 2022-05-06 DIAGNOSIS — I10 Essential (primary) hypertension: Secondary | ICD-10-CM

## 2022-05-06 NOTE — Telephone Encounter (Signed)
Requested Prescriptions  Refused Prescriptions Disp Refills   lisinopril (ZESTRIL) 20 MG tablet [Pharmacy Med Name: LISINOPRIL 20 MG TABLET] 90 tablet 1    Sig: TAKE 1 TABLET BY MOUTH EVERY DAY     Cardiovascular:  ACE Inhibitors Failed - 05/06/2022  5:23 PM      Failed - Last BP in normal range    BP Readings from Last 1 Encounters:  04/20/22 (!) 142/92         Passed - Cr in normal range and within 180 days    Creatinine  Date Value Ref Range Status  07/07/2011 0.95 0.60 - 1.30 mg/dL Final   Creatinine, Ser  Date Value Ref Range Status  04/15/2022 0.92 0.44 - 1.00 mg/dL Final         Passed - K in normal range and within 180 days    Potassium  Date Value Ref Range Status  04/15/2022 3.9 3.5 - 5.1 mmol/L Final  07/07/2011 3.7 3.5 - 5.1 mmol/L Final         Passed - Patient is not pregnant      Passed - Valid encounter within last 6 months    Recent Outpatient Visits           2 weeks ago Primary hypertension   [place], [place], PA-C   2 months ago Annual physical exam   [facility] [name], FNP   3 months ago Urgency of urination   [place], FNP   5 months ago [place], FNP   5 months ago Urinary frequency   [place], [place], [place]       Future Appointments             In 3 weeks [place], Aaron Edelman, MD [place] [facility] [place]   In 3 months Debroah Loop, [place] [facility]

## 2022-05-12 DIAGNOSIS — E890 Postprocedural hypothyroidism: Secondary | ICD-10-CM | POA: Diagnosis not present

## 2022-05-19 DIAGNOSIS — E890 Postprocedural hypothyroidism: Secondary | ICD-10-CM | POA: Diagnosis not present

## 2022-05-19 DIAGNOSIS — H6123 Impacted cerumen, bilateral: Secondary | ICD-10-CM | POA: Diagnosis not present

## 2022-05-25 ENCOUNTER — Other Ambulatory Visit: Payer: Medicare HMO

## 2022-05-30 ENCOUNTER — Ambulatory Visit (INDEPENDENT_AMBULATORY_CARE_PROVIDER_SITE_OTHER): Payer: Medicare HMO

## 2022-05-30 ENCOUNTER — Ambulatory Visit: Payer: Medicare HMO | Admitting: Gastroenterology

## 2022-05-30 ENCOUNTER — Ambulatory Visit: Payer: Medicare HMO | Attending: Cardiology | Admitting: Cardiology

## 2022-05-30 ENCOUNTER — Encounter: Payer: Self-pay | Admitting: Cardiology

## 2022-05-30 VITALS — BP 162/78 | HR 59 | Ht 68.0 in | Wt 199.6 lb

## 2022-05-30 DIAGNOSIS — R55 Syncope and collapse: Secondary | ICD-10-CM | POA: Diagnosis not present

## 2022-05-30 DIAGNOSIS — I10 Essential (primary) hypertension: Secondary | ICD-10-CM

## 2022-05-30 NOTE — Patient Instructions (Signed)
Medication Instructions:   Your physician recommends that you continue on your current medications as directed. Please refer to the Current Medication list given to you today.  *If you need a refill on your cardiac medications before your next appointment, please call your pharmacy*   Lab Work:  None Ordered  If you have labs (blood work) drawn today and your tests are completely normal, you will receive your results only by: MyChart Message (if you have MyChart) OR A paper copy in the mail If you have any lab test that is abnormal or we need to change your treatment, we will call you to review the results.   Testing/Procedures:  Your physician has requested that you have an echocardiogram. Echocardiography is a painless test that uses sound waves to create images of your heart. It provides your doctor with information about the size and shape of your heart and how well your heart's chambers and valves are working. This procedure takes approximately one hour. There are no restrictions for this procedure. Please do NOT wear cologne, perfume, aftershave, or lotions (deodorant is allowed). Please arrive 15 minutes prior to your appointment time.  Your physician has recommended that you wear a Zio monitor.   This monitor is a medical device that records the heart's electrical activity. Doctors most often use these monitors to diagnose arrhythmias. Arrhythmias are problems with the speed or rhythm of the heartbeat. The monitor is a small device applied to your chest. You can wear one while you do your normal daily activities. While wearing this monitor if you have any symptoms to push the button and record what you felt. Once you have worn this monitor for the period of time provider prescribed (Usually 14 days), you will return the monitor device in the postage paid box. Once it is returned they will download the data collected and provide us with a report which the provider will then review and  we will call you with those results. Important tips:  Avoid showering during the first 24 hours of wearing the monitor. Avoid excessive sweating to help maximize wear time. Do not submerge the device, no hot tubs, and no swimming pools. Keep any lotions or oils away from the patch. After 24 hours you may shower with the patch on. Take brief showers with your back facing the shower head.  Do not remove patch once it has been placed because that will interrupt data and decrease adhesive wear time. Push the button when you have any symptoms and write down what you were feeling. Once you have completed wearing your monitor, remove and place into box which has postage paid and place in your outgoing mailbox.  If for some reason you have misplaced your box then call our office and we can provide another box and/or mail it off for you.      Follow-Up: At [facility], you and your health needs are our priority.  As part of our continuing mission to provide you with exceptional heart care, we have created designated Provider Care Teams.  These Care Teams include your primary Cardiologist (physician) and Advanced Practice Providers (APPs -  Physician Assistants and Nurse Practitioners) who all work together to provide you with the care you need, when you need it.  We recommend signing up for the patient portal called "MyChart".  Sign up information is provided on this After Visit Summary.  MyChart is used to connect with patients for Virtual Visits (Telemedicine).  Patients are able to   view lab/test results, encounter notes, upcoming appointments, etc.  Non-urgent messages can be sent to your provider as well.   To learn more about what you can do with MyChart, go to [email or web address].    Your next appointment:    After testing  Provider:   You may see Brian Agbor-Etang, MD or one of the following Advanced Practice Providers on your designated Care Team:   Christopher Berge,  NP Ryan Dunn, PA-C Cadence Furth, PA-C Sheri Hammock, NP  

## 2022-05-30 NOTE — Progress Notes (Signed)
Cardiology Office Note:    Date:  05/30/2022   ID:  Eileen Flores, DOB 1935-03-27, MRN [id number]  PCP:  Jacky Kindle, FNP   [facility] Providers Cardiologist:  Debbe Odea, MD     Referring MD: Concha Se, MD   Chief Complaint  Patient presents with   New Patient (Initial Visit)    Evaluation for syncope episode and 1 near syncope episode.  Did present to ED on 04/15/22 with syncopal episode.  Has chronic SOBr followed by pulmonology.      History of Present Illness:    Eileen Flores is a 87 y.o. female with a hx of hypothyroidism, ILD who presents due to syncope.  Had a syncopal episode about 1 month ago.  She was having a bowel movement, and does straining.  She stood up after, felt dizzy and passed out.  Her son heard her fall, took her to the emergency room.  Workup with head CT was unrevealing.  Other testing including EKG is unrevealing.  She states having similar symptoms couple of weeks later, also while having a bowel movement but did not pass out this time.  Denies any history of heart disease, takes blood pressure medications as prescribed.  BP at home in the 120s systolic.  Denies palpitations, chest pain.  Has not had any episodes since.  States drinking more fluids and staying hydrated.  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   cataract surgery Bilateral 2009   COLONOSCOPY  2011   Dr. Lemar Livings   dermbrasion   1965   OVARIAN CYST REMOVAL  1967   POLYPECTOMY  2006   THYROIDECTOMY  2006   TONGUE SURGERY  2009    Current Medications: Current Meds  Medication Sig   ciprofloxacin-dexamethasone (CIPRODEX) OTIC suspension Place 4 drops into the left ear 2 (two) times daily.   conjugated estrogens (PREMARIN) vaginal cream Place 1 applicator vaginally. 3 times a week   levothyroxine (SYNTHROID) 112 MCG tablet Take 112 mcg by mouth daily.   lisinopril (ZESTRIL) 20 MG tablet Take 2 tablets (40 mg total) by mouth daily.   mirabegron ER (MYRBETRIQ) 25 MG TB24 tablet Take 1 tablet (25 mg total) by mouth daily.   timolol (TIMOPTIC) 0.5 % ophthalmic solution Place 1 drop into both eyes daily.     Allergies:   Penicillin g and Penicillins   Social History   Socioeconomic  History   Marital status: Widowed    Spouse name: Not on file   Number of children: 2   Years of education: Not on file   Highest education level: Some college, no degree  Occupational History   Not on file  Tobacco Use   Smoking status: Former    Types: Cigarettes   Smokeless tobacco: Never   Tobacco comments:    quit in mid to late 20's  Vaping Use   Vaping Use: Never used  Substance and Sexual Activity   Alcohol use: No    Comment: rare- 1 drink   Drug use: No   Sexual activity: Not Currently  Other Topics Concern   Not on file  Social History Narrative   Not on file   Social Determinants of Health   Financial Resource Strain: Low Risk  (11/03/2021)   Overall Financial Resource Strain (CARDIA)    Difficulty  of Paying Living Expenses: Not hard at all  Food Insecurity: No Food Insecurity (04/18/2022)   Hunger Vital Sign    Worried About Running Out of Food in the Last Year: Never true    Ran Out of Food in the Last Year: Never true  Transportation Needs: No Transportation Needs (04/18/2022)   PRAPARE - Administrator, Civil Service (Medical): No    Lack of Transportation (Non-Medical): No  Physical Activity: Insufficiently Active (11/03/2021)   Exercise Vital Sign    Days of Exercise per Week: 3 days    Minutes of Exercise per Session: 30 min  Stress: No Stress Concern Present (11/03/2021)   [employer] of Occupational Health - Occupational Stress Questionnaire    Feeling of Stress : Only a little  Social Connections: Socially Isolated (11/03/2021)   Social Connection and Isolation Panel [NHANES]    Frequency of Communication with Friends and Family: Twice a week    Frequency of Social Gatherings with Friends and Family: Once a week    Attends Religious Services: Never    Database administrator or Organizations: No    Attends Banker Meetings: Never    Marital Status: Widowed     Family History: The patient's family history includes Alcohol abuse in her father; Cancer (age of onset: 30) in her father; Cancer (age of onset: 83) in her mother; Colon cancer in an other family member; Colon polyps in an other family member; Heart attack in her brother. There is no history of Breast cancer.  ROS:   Please see the history of present illness.     All other systems reviewed and are negative.  EKGs/Labs/Other Studies Reviewed:    The following studies were reviewed today:   EKG:  EKG is  ordered today.  The ekg ordered today demonstrates sinus bradycardia, heart rate 59, left bundle branch block.  Recent Labs: 12/02/2021: ALT 14 04/15/2022: BUN 24; Creatinine, Ser 0.92; Hemoglobin 13.6; Platelets 311; Potassium 3.9; Sodium 139  Recent Lipid Panel    Component  Value Date/Time   CHOL 205 (H) 12/21/2020 1513   TRIG 130 12/21/2020 1513   HDL 52 12/21/2020 1513   CHOLHDL 3.9 12/21/2020 1513   LDLCALC 130 (H) 12/21/2020 1513     Risk Assessment/Calculations:         Physical Exam:    VS:  BP (!) 162/78 (BP Location: Left Arm, Patient Position: Sitting, Cuff Size: Large)   Pulse (!) 59   Ht 5\' 8"  (1.727 m)   Wt 199 lb 9.6 oz (90.5  kg)   SpO2 98%   BMI 30.35 kg/m     Wt Readings from Last 3 Encounters:  05/30/22 199 lb 9.6 oz (90.5 kg)  04/20/22 197 lb (89.4 kg)  04/19/22 197 lb (89.4 kg)     GEN:  Well nourished, well developed in no acute distress HEENT: Normal NECK: No JVD; No carotid bruits CARDIAC: RRR, no murmurs, rubs, gallops RESPIRATORY:  Clear to auscultation without rales, wheezing or rhonchi  ABDOMEN: Soft, non-tender, non-distended MUSCULOSKELETAL:  No edema; No deformity  SKIN: Warm and dry NEUROLOGIC:  Alert and oriented x 3 PSYCHIATRIC:  Normal affect   ASSESSMENT:    1. Syncope and collapse   2. Primary hypertension    PLAN:    In order of problems listed above:  Syncope, appears vasovagal with presyncopal and syncopal episode occurring in the context of a bowel movement.  Get echo and place cardiac monitor to rule out any arrhythmias to complete cardiac workup. Hypertension, BP elevated, usually controlled.  Continue lisinopril.  Follow-up after echo and cardiac monitor.       Medication Adjustments/Labs and Tests Ordered: Current medicines are reviewed at length with the patient today.  Concerns regarding medicines are outlined above.  Orders Placed This Encounter  Procedures   LONG TERM MONITOR (3-14 DAYS)   EKG 12-Lead   ECHOCARDIOGRAM COMPLETE   No orders of the defined types were placed in this encounter.   Patient Instructions  Medication Instructions:   Your physician recommends that you continue on your current medications as directed. Please refer to the Current Medication list  given to you today.  *If you need a refill on your cardiac medications before your next appointment, please call your pharmacy*   Lab Work:  None Ordered  If you have labs (blood work) drawn today and your tests are completely normal, you will receive your results only by: MyChart Message (if you have MyChart) OR A paper copy in the mail If you have any lab test that is abnormal or we need to change your treatment, we will call you to review the results.   Testing/Procedures:  Your physician has requested that you have an echocardiogram. Echocardiography is a painless test that uses sound waves to create images of your heart. It provides your doctor with information about the size and shape of your heart and how well your heart's chambers and valves are working. This procedure takes approximately one hour. There are no restrictions for this procedure. Please do NOT wear cologne, perfume, aftershave, or lotions (deodorant is allowed). Please arrive 15 minutes prior to your appointment time.  Your physician has recommended that you wear a Zio monitor.   This monitor is a medical device that records the heart's electrical activity. Doctors most often use these monitors to diagnose arrhythmias. Arrhythmias are problems with the speed or rhythm of the heartbeat. The monitor is a small device applied to your chest. You can wear one while you do your normal daily activities. While wearing this monitor if you have any symptoms to push the button and record what you felt. Once you have worn this monitor for the period of time provider prescribed (Usually 14 days), you will return the monitor device in the postage paid box. Once it is returned they will download the data collected and provide [place] with a report which the provider will then review and we will call you with those results. Important tips:  Avoid showering during the first 24 hours of wearing  the monitor. Avoid excessive sweating to help  maximize wear time. Do not submerge the device, no hot tubs, and no swimming pools. Keep any lotions or oils away from the patch. After 24 hours you may shower with the patch on. Take brief showers with your back facing the shower head.  Do not remove patch once it has been placed because that will interrupt data and decrease adhesive wear time. Push the button when you have any symptoms and write down what you were feeling. Once you have completed wearing your monitor, remove and place into box which has postage paid and place in your outgoing mailbox.  If for some reason you have misplaced your box then call our office and we can provide another box and/or mail it off for you.      Follow-Up: At [facility], you and your health needs are our priority.  As part of our continuing mission to provide you with exceptional heart care, we have created designated Provider Care Teams.  These Care Teams include your primary Cardiologist (physician) and Advanced Practice Providers (APPs -  Physician Assistants and Nurse Practitioners) who all work together to provide you with the care you need, when you need it.  We recommend signing up for the patient portal called "MyChart".  Sign up information is provided on this After Visit Summary.  MyChart is used to connect with patients for Virtual Visits (Telemedicine).  Patients are able to view lab/test results, encounter notes, upcoming appointments, etc.  Non-urgent messages can be sent to your provider as well.   To learn more about what you can do with MyChart, go to [email or web address].    Your next appointment:    After testing  Provider:   You may see Debbe Odea, MD or one of the following Advanced Practice Providers on your designated Care Team:   Nicolasa Ducking, NP Eula Listen, PA-C Cadence Fransico Michael, PA-C Charlsie Quest, NP     Signed, Debbe Odea, MD  05/30/2022 12:08 PM    [facility]

## 2022-06-02 DIAGNOSIS — R55 Syncope and collapse: Secondary | ICD-10-CM

## 2022-06-10 ENCOUNTER — Ambulatory Visit: Payer: Medicare HMO | Admitting: Cardiology

## 2022-06-21 DIAGNOSIS — R55 Syncope and collapse: Secondary | ICD-10-CM | POA: Diagnosis not present

## 2022-06-28 ENCOUNTER — Ambulatory Visit: Payer: Medicare HMO | Attending: Cardiology

## 2022-06-28 DIAGNOSIS — R55 Syncope and collapse: Secondary | ICD-10-CM

## 2022-06-28 LAB — ECHOCARDIOGRAM COMPLETE
AR max vel: 1.95 cm2
AV Area VTI: 1.83 cm2
AV Area mean vel: 1.91 cm2
AV Mean grad: 6 mmHg
AV Peak grad: 10.5 mmHg
Ao pk vel: 1.62 m/s
S' Lateral: 2.6 cm

## 2022-07-05 ENCOUNTER — Ambulatory Visit: Payer: Medicare HMO | Attending: Cardiology | Admitting: Cardiology

## 2022-07-05 ENCOUNTER — Encounter: Payer: Self-pay | Admitting: Cardiology

## 2022-07-05 VITALS — BP 144/76 | HR 63 | Ht 69.0 in | Wt 199.2 lb

## 2022-07-05 DIAGNOSIS — J849 Interstitial pulmonary disease, unspecified: Secondary | ICD-10-CM

## 2022-07-05 DIAGNOSIS — R55 Syncope and collapse: Secondary | ICD-10-CM | POA: Diagnosis not present

## 2022-07-05 DIAGNOSIS — I471 Supraventricular tachycardia, unspecified: Secondary | ICD-10-CM | POA: Diagnosis not present

## 2022-07-05 DIAGNOSIS — I10 Essential (primary) hypertension: Secondary | ICD-10-CM

## 2022-07-05 DIAGNOSIS — R0602 Shortness of breath: Secondary | ICD-10-CM | POA: Diagnosis not present

## 2022-07-05 NOTE — Patient Instructions (Signed)
Medication Instructions:   Your physician recommends that you continue on your current medications as directed. Please refer to the Current Medication list given to you today.  *If you need a refill on your cardiac medications before your next appointment, please call your pharmacy*   Lab Work:  None  If you have labs (blood work) drawn today and your tests are completely normal, you will receive your results only by: MyChart Message (if you have MyChart) OR A paper copy in the mail If you have any lab test that is abnormal or we need to change your treatment, we will call you to review the results.   Testing/Procedures:  None   Follow-Up: At [facility], you and your health needs are our priority.  As part of our continuing mission to provide you with exceptional heart care, we have created designated Provider Care Teams.  These Care Teams include your primary Cardiologist (physician) and Advanced Practice Providers (APPs -  Physician Assistants and Nurse Practitioners) who all work together to provide you with the care you need, when you need it.  We recommend signing up for the patient portal called "MyChart".  Sign up information is provided on this After Visit Summary.  MyChart is used to connect with patients for Virtual Visits (Telemedicine).  Patients are able to view lab/test results, encounter notes, upcoming appointments, etc.  Non-urgent messages can be sent to your provider as well.   To learn more about what you can do with MyChart, go to [email or web address].    Your next appointment:   6 month(s)  Provider:   Debbe Odea, MD

## 2022-07-05 NOTE — Progress Notes (Signed)
Cardiology Office Note:   Date:  07/05/2022  ID:  Eileen Flores, DOB [birth year], MRN [id number]  History of Present Illness:   Eileen Flores is a 87 y.o. female with past medical history of hypothyroidism, syncope, breast cancer, gastroesophageal reflux disease, hyperlipidemia, chronic shortness of breath, syncope with collapse, who presents today for follow-up.  She had a syncopal episode approximately a month prior in March.  She was having a bowel movement while straining after she stood up she felt dizzy and passed out.  Her son heard her fall and took her to the emergency room.  Workup in the emergency department CTA was unrevealing, other testing including EKG was unrevealing.  She states she has had similar symptoms couple of weeks later while also having a bowel movement but did not pass out at that time.  Last seen in clinic on 05/30/2022 by Dr.Agbor-Etang.  Would order an echocardiogram and ZIO to rule out any arrhythmias to complete cardiac workup.  She returns to clinic today stating that overall she has been doing fairly well.  She has denied any further episodes of syncope or near syncope, lightheadedness or dizziness.  She states that she thinks that some of her symptoms were related to dehydration and has increased her fluid intake which seems to have resolved all of her symptoms.  Blood pressure is slightly elevated today when questioned on her medication regimen she had decided to only take 20 mg of her lisinopril.  She denies any recent hospitalizations or visits to the emergency department.  ROS: 10 point review of systems has been completed and is considered negative with the exception of what is been listed in the HPI  Studies Reviewed:    EKG: There were no new tracings that were completed today  TTE 06/28/22 1. Left ventricular ejection fraction, by estimation, is 55 to 60%. The  left ventricle has normal function. The left ventricle has no regional  wall motion  abnormalities. Left ventricular diastolic parameters are  consistent with Grade I diastolic  dysfunction (impaired relaxation).   2. Right ventricular systolic function is normal. The right ventricular  size is normal. There is normal pulmonary artery systolic pressure. The  estimated right ventricular systolic pressure is 35.2 mmHg.   3. The mitral valve is normal in structure. No evidence of mitral valve  regurgitation. No evidence of mitral stenosis.   4. The aortic valve has an indeterminant number of cusps. Aortic valve  regurgitation is trivial. No aortic stenosis is present.   5. The inferior vena cava is normal in size with greater than 50%  respiratory variability, suggesting right atrial pressure of 3 mmHg.   Zio Heart Monitor 06/21/22 Patient had a min HR of 48 bpm, max HR of 179 bpm, and avg HR of 66 bpm. Predominant underlying rhythm was Sinus Rhythm. Bundle Branch Block/IVCD was present. 32 Supraventricular Tachycardia runs occurred, the run with the fastest interval lasting 5  beats with a max rate of 179 bpm, the longest lasting 19 beats with an avg rate of 129 bpm. Isolated SVEs were rare (<1.0%), SVE Couplets were rare (<1.0%), and SVE Triplets were rare (<1.0%). Isolated VEs were rare (<1.0%), and no VE Couplets or VE  Triplets were present. Inverted QRS complexes possibly due to inverted placement of device.   Conclusion Paroxysmal SVT No significant sustained arrhythmias to suggest etiology of syncope  Risk Assessment/Calculations:     HYPERTENSION CONTROL Vitals:   07/05/22 1057 07/05/22 1105  BP: (!) 150/77 [name])  144/76    The patient's blood pressure is elevated above target today.  In order to address the patient's elevated BP: A current anti-hypertensive medication was adjusted today.           Physical Exam:   VS:  BP (!) 144/76 (BP Location: Right Arm, Patient Position: Sitting, Cuff Size: Large)   Pulse 63   Ht 5\' 9"  (1.753 m)   Wt 199 lb 3.2 oz  (90.4 kg)   SpO2 96%   BMI 29.42 kg/m    Wt Readings from Last 3 Encounters:  07/05/22 199 lb 3.2 oz (90.4 kg)  05/30/22 199 lb 9.6 oz (90.5 kg)  04/20/22 197 lb (89.4 kg)     GEN: Well nourished, well developed in no acute distress NECK: No JVD; No carotid bruits CARDIAC: RRR, no murmurs, rubs, gallops RESPIRATORY:  Clear to auscultation without rales, wheezing or rhonchi  ABDOMEN: Soft, non-tender, non-distended EXTREMITIES: Trace pretibial edema; No deformity   ASSESSMENT AND PLAN:   Previous syncopal episode with collapse which appeared to be vasovagal.  Echocardiogram revealed an LVEF of 55-60%, no regional wall motion abnormalities, G1 DD, and no valvular abnormalities.  She also wore a ZIO heart monitor which revealed some paroxysmal SVT but no significant sustained arrhythmia to suggest the etiology of syncope.  Third continued workup with essentially on founded.  She had also had possible vasovagal while she was straining in the bathroom but states that that that has improved with her increased water intake.  Essential hypertension with elevated blood pressure of 150/77 on recheck was 144/76.  Patient states that was the best thing that she needed to do for herself.  Reviewing blood pressures from several appointments where she stated she started taking 20 mg her blood pressure is noted to be quite elevated.  She has been encouraged to continue to monitor her blood pressure 1 to 2 hours post her medications but to continue with her previously prescribed dose of 40 mg daily.  Chronic shortness of breath from interstitial lung disease he continues to be followed by pulmonary.  Disposition patient return to clinic to see MD/APP in 6 months or sooner if recurrent symptoms        Signed, Albena Comes, NP

## 2022-07-07 DIAGNOSIS — N281 Cyst of kidney, acquired: Secondary | ICD-10-CM | POA: Diagnosis not present

## 2022-07-07 DIAGNOSIS — I10 Essential (primary) hypertension: Secondary | ICD-10-CM | POA: Diagnosis not present

## 2022-08-15 ENCOUNTER — Ambulatory Visit: Payer: Medicare HMO | Admitting: Physician Assistant

## 2022-08-15 ENCOUNTER — Encounter: Payer: Self-pay | Admitting: Physician Assistant

## 2022-08-15 VITALS — BP 150/76 | HR 66 | Ht 69.0 in | Wt 198.0 lb

## 2022-08-15 DIAGNOSIS — N3941 Urge incontinence: Secondary | ICD-10-CM

## 2022-08-15 DIAGNOSIS — Z87440 Personal history of urinary (tract) infections: Secondary | ICD-10-CM

## 2022-08-15 DIAGNOSIS — N390 Urinary tract infection, site not specified: Secondary | ICD-10-CM

## 2022-08-15 LAB — BLADDER SCAN AMB NON-IMAGING

## 2022-08-15 MED ORDER — MIRABEGRON ER 25 MG PO TB24: 25.0000 mg | ORAL_TABLET | Freq: Every day | ORAL | 11 refills | Status: DC

## 2022-08-15 NOTE — Progress Notes (Signed)
08/15/2022 11:46 AM   Eileen Flores 1935-12-05 [id number]  CC: Chief Complaint  Patient presents with   Urinary Incontinence   HPI: Eileen Flores is a 87 y.o. female with PMH OAB wet on Myrbetriq 25 mg daily and recurrent UTI on topical vaginal estrogen cream who presents today for OAB follow-up.   Today she reports her symptoms remain improved on Myrbetriq 25 mg.  She still has some urinary frequency and urge incontinence, which she largely attributes to staying very well-hydrated.  Overall she is pleased with her progress and wishes to continue her current regimen.    She has continued to use topical vaginal estrogen cream as well and denies UTIs since her last office visit.  PVR 79mL.  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   cataract surgery Bilateral 2009   COLONOSCOPY  2011   Dr. Lemar Livings   dermbrasion   1965   OVARIAN CYST REMOVAL  1967   POLYPECTOMY  2006   THYROIDECTOMY  2006   TONGUE  SURGERY  2009    Home Medications:  Allergies as of 08/15/2022       Reactions   Penicillin G Rash   Penicillins Rash        Medication List        Accurate as of August 15, 2022 11:46 AM. If you have any questions, ask your nurse or doctor.          AZO CRANBERRY PO Take by mouth as needed.   Calcium Carb-Cholecalciferol 600-10 MG-MCG Chew Chew by mouth.   ciprofloxacin-dexamethasone OTIC suspension Commonly known as: Ciprodex Place 4 drops into the left ear 2 (two) times daily.   conjugated estrogens vaginal cream Commonly known as: PREMARIN Place 1 applicator vaginally. 3 times a week   levothyroxine 112 MCG tablet Commonly known as: SYNTHROID Take 112 mcg by mouth daily.   lisinopril 20 MG tablet Commonly known as: ZESTRIL Take 2 tablets (40 mg total) by mouth daily. What changed: how much to take   mirabegron ER 25 MG Tb24 tablet Commonly known as: Myrbetriq Take 1 tablet (25 mg total) by mouth daily.   timolol 0.5 % ophthalmic solution Commonly known as: TIMOPTIC Place 1 drop into both eyes 2 (two) times daily.        Allergies:  Allergies  Allergen Reactions   Penicillin G Rash   Penicillins Rash  Family History: Family History  Problem Relation Age of Onset   Cancer Mother 14       liver cancer   Cancer Father 27       liver cancer   Alcohol abuse Father    Heart attack Brother    Colon cancer Other    Colon polyps Other    Breast cancer Neg Hx     Social History:   reports that she has quit smoking. Her smoking use included cigarettes. She has never used smokeless tobacco. She reports that she does not drink alcohol and does not use drugs.  Physical Exam: BP (!) 150/76 (BP Location: Left Arm, Patient Position: Sitting, Cuff Size: Large)   Pulse 66   Ht 5\' 9"  (1.753 m)   Wt 198 lb (89.8 kg)   BMI 29.24 kg/m   Constitutional:  Alert and oriented, no acute distress, nontoxic appearing HEENT: [place], AT Cardiovascular: No  clubbing, cyanosis, or edema Respiratory: Normal respiratory effort, no increased work of breathing Skin: No rashes, bruises or suspicious lesions Neurologic: Grossly intact, no focal deficits, moving all 4 extremities Psychiatric: Normal mood and affect  Laboratory Data: Results for orders placed or performed in visit on 08/15/22  Bladder Scan (Post Void Residual) in office  Result Value Ref Range   Scan Result 79ml    Assessment & Plan:   1. Urge incontinence of urine Well-managed on Myrbetriq 25 mg.  Will continue this.  If her symptoms or bother worsen, may consider increasing Myrbetriq to 50 mg daily, switching to [place], and or augmenting with third line therapies. - Bladder Scan (Post Void Residual) in office - mirabegron ER (MYRBETRIQ) 25 MG TB24 tablet; Take 1 tablet (25 mg total) by mouth daily.  Dispense: 30 tablet; Refill: 11  2. Recurrent UTI No UTIs in the past 6 months.  Continue topical vaginal estrogen cream.  Return in about 1 year (around 08/15/2023) for Annual OAB f/u with PVR.  Eileen Ching, PA-C  [facility] [place] [address] [place], [place] [address] [phone]

## 2022-08-18 DIAGNOSIS — H401131 Primary open-angle glaucoma, bilateral, mild stage: Secondary | ICD-10-CM | POA: Diagnosis not present

## 2022-08-18 DIAGNOSIS — H26491 Other secondary cataract, right eye: Secondary | ICD-10-CM | POA: Diagnosis not present

## 2022-08-18 DIAGNOSIS — Z961 Presence of intraocular lens: Secondary | ICD-10-CM | POA: Diagnosis not present

## 2022-08-18 DIAGNOSIS — H43813 Vitreous degeneration, bilateral: Secondary | ICD-10-CM | POA: Diagnosis not present

## 2022-09-15 ENCOUNTER — Other Ambulatory Visit: Payer: Self-pay | Admitting: Family Medicine

## 2022-09-15 DIAGNOSIS — M7989 Other specified soft tissue disorders: Secondary | ICD-10-CM

## 2022-09-16 NOTE — Telephone Encounter (Signed)
Requested Prescriptions  Refused Prescriptions Disp Refills   furosemide (LASIX) 20 MG tablet [Pharmacy Med Name: FUROSEMIDE 20 MG TABLET] 90 tablet 0    Sig: TAKE 1 TABLET BY MOUTH EVERY DAY     Cardiovascular:  Diuretics - Loop Failed - 09/15/2022 11:49 AM      Failed - Ca in normal range and within 180 days    Calcium  Date Value Ref Range Status  04/15/2022 8.8 (L) 8.9 - 10.3 mg/dL Final   Calcium, Total  Date Value Ref Range Status  07/07/2011 9.2 8.5 - 10.1 mg/dL Final         Failed - Mg Level in normal range and within 180 days    No results found for: "MG"       Failed - Last BP in normal range    BP Readings from Last 1 Encounters:  08/15/22 (!) 150/76         Passed - K in normal range and within 180 days    Potassium  Date Value Ref Range Status  04/15/2022 3.9 3.5 - 5.1 mmol/L Final  07/07/2011 3.7 3.5 - 5.1 mmol/L Final         Passed - Na in normal range and within 180 days    Sodium  Date Value Ref Range Status  04/15/2022 139 135 - 145 mmol/L Final  12/06/2021 142 134 - 144 mmol/L Final  07/07/2011 142 136 - 145 mmol/L Final         Passed - Cr in normal range and within 180 days    Creatinine  Date Value Ref Range Status  07/07/2011 0.95 0.60 - 1.30 mg/dL Final   Creatinine, Ser  Date Value Ref Range Status  04/15/2022 0.92 0.44 - 1.00 mg/dL Final         Passed - Cl in normal range and within 180 days    Chloride  Date Value Ref Range Status  04/15/2022 105 98 - 111 mmol/L Final  07/07/2011 107 98 - 107 mmol/L Final         Passed - Valid encounter within last 6 months    Recent Outpatient Visits           5 months ago Primary hypertension   [facility] [place], [place], PA-C   7 months ago Annual physical exam   [facility] Jacky Kindle, FNP   7 months ago Urgency of urination   [facility] Jacky Kindle, FNP   9 months ago Gastroenteritis   [facility] Jacky Kindle, FNP   10 months ago Urinary frequency   [facility] [place], [place], [place]       Future Appointments             In 3 weeks [name], MD [facility] at [place]   In 3 months [place], Arlys John, MD [facility] [place]   In 11 months Janalee Dane [facility]

## 2022-09-30 ENCOUNTER — Ambulatory Visit: Payer: Medicare HMO | Admitting: Physician Assistant

## 2022-09-30 ENCOUNTER — Encounter: Payer: Self-pay | Admitting: Physician Assistant

## 2022-09-30 VITALS — BP 127/87 | HR 64 | Wt 199.3 lb

## 2022-09-30 DIAGNOSIS — N3941 Urge incontinence: Secondary | ICD-10-CM

## 2022-09-30 DIAGNOSIS — R350 Frequency of micturition: Secondary | ICD-10-CM | POA: Diagnosis not present

## 2022-09-30 DIAGNOSIS — N390 Urinary tract infection, site not specified: Secondary | ICD-10-CM | POA: Diagnosis not present

## 2022-09-30 LAB — MICROSCOPIC EXAMINATION: Bacteria, UA: NONE SEEN

## 2022-09-30 LAB — URINALYSIS, COMPLETE
Bilirubin, UA: NEGATIVE
Glucose, UA: NEGATIVE
Ketones, UA: NEGATIVE
Nitrite, UA: NEGATIVE
Protein,UA: NEGATIVE
Specific Gravity, UA: 1.02 (ref 1.005–1.030)
Urobilinogen, Ur: 0.2 mg/dL (ref 0.2–1.0)
pH, UA: 5.5 (ref 5.0–7.5)

## 2022-09-30 LAB — BLADDER SCAN AMB NON-IMAGING

## 2022-09-30 MED ORDER — GEMTESA 75 MG PO TABS: 75.0000 mg | ORAL_TABLET | Freq: Every day | ORAL | Status: DC

## 2022-09-30 NOTE — Progress Notes (Unsigned)
09/30/2022 3:29 PM   Eileen Flores 1935/08/27 [id number]  CC: Chief Complaint  Patient presents with   Urinary Frequency   HPI: Eileen Flores is a 87 y.o. female with PMH OAB wet on Myrbetriq 25 mg daily and recurrent UTI on topical vaginal estrogen cream who presents today for evaluation of urinary urgency/frequency/leakage.   I saw her in clinic most recently on 08/15/2022 for OAB follow-up on Myrbetriq 25 mg.  At the time, she was pleased with her symptoms so we continued it.  Today she reports recent worsening in urgency, frequency, and urge incontinence.  She has also been having some bodyaches in her low back, knees, feet, and arms and is unsure if all of these are related to her arthritis or could be associated with her urinary tract.  She denies dysuria, fever, chills, nausea, vomiting, and gross hematuria.  In-office UA today positive for trace lysed blood and 1+ leukocytes; urine microscopy with 11-30 WBCs/HPF. PVR 28mL.  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   cataract surgery Bilateral 2009   COLONOSCOPY  2011   Dr. Lemar Livings   dermbrasion   1965   OVARIAN CYST REMOVAL  1967   POLYPECTOMY  2006   THYROIDECTOMY  2006   TONGUE SURGERY  2009    Home Medications:  Allergies as of 09/30/2022       Reactions   Penicillin G Rash   Penicillins Rash        Medication List        Accurate as of September 30, 2022  3:29 PM. If you have any questions, ask your nurse or doctor.          STOP taking these medications    ciprofloxacin-dexamethasone OTIC suspension Commonly known as: Ciprodex Stopped by: Carman Ching       TAKE these medications    AZO CRANBERRY PO Take by mouth as needed.   Calcium Carb-Cholecalciferol 600-10 MG-MCG Chew Chew by mouth.   conjugated estrogens vaginal cream Commonly known as: PREMARIN Place 1 applicator vaginally. 3 times a week   levothyroxine 112 MCG tablet Commonly known as: SYNTHROID Take 112 mcg by mouth daily.   lisinopril 20 MG tablet Commonly known as: ZESTRIL Take 2 tablets (40 mg total) by mouth daily. What changed: how much to take   mirabegron ER 25 MG Tb24 tablet Commonly known as: Myrbetriq  Take 1 tablet (25 mg total) by mouth daily.   timolol 0.5 % ophthalmic solution Commonly known as: TIMOPTIC Place 1 drop into both eyes 2 (two) times daily.        Allergies:  Allergies  Allergen Reactions   Penicillin G Rash   Penicillins Rash    Family History: Family History  Problem Relation Age of Onset   Cancer Mother 42       liver cancer   Cancer Father 32       liver cancer   Alcohol abuse Father    Heart attack Brother    Colon cancer Other    Colon polyps Other    Breast cancer Neg Hx     Social History:   reports that she has quit smoking. Her smoking use included cigarettes. She has never used smokeless tobacco. She reports that she does not drink alcohol and does not use  drugs.  Physical Exam: BP 127/87   Pulse 64   Wt 199 lb 4.8 oz (90.4 kg)   BMI 29.43 kg/m   Constitutional:  Alert and oriented, no acute distress, nontoxic appearing HEENT: [place], AT Cardiovascular: No clubbing, cyanosis, or edema Respiratory: Normal respiratory effort, no increased work of breathing Skin: No rashes, bruises or suspicious lesions Neurologic: Grossly intact, no focal deficits, moving all 4 extremities Psychiatric: Normal mood and affect  Laboratory Data: Results for orders placed or performed in visit on 09/30/22  CULTURE, URINE COMPREHENSIVE   Specimen: Urine   UR  Result Value Ref Range   Urine Culture, Comprehensive Final report    Organism ID, Bacteria Comment   Microscopic Examination   Urine  Result Value Ref Range   WBC, UA 11-30 (A) 0 - 5 /hpf   RBC, Urine 0-2 0 - 2 /hpf   Epithelial Cells (non renal) 0-10 0 - 10 /hpf   Renal Epithel, UA 0-10 (A) None seen /hpf   Bacteria, UA None seen None seen/Few  Urinalysis, Complete  Result Value Ref Range   Specific Gravity, UA 1.020 1.005 - 1.030   pH, UA 5.5 5.0 - 7.5   Color, UA Yellow Yellow   Appearance Ur Hazy (A) Clear   Leukocytes,UA 1+ (A) Negative   Protein,UA Negative Negative/Trace   Glucose, UA Negative Negative   Ketones, UA Negative Negative   RBC, UA Trace (A) Negative   Bilirubin, UA Negative Negative   Urobilinogen, Ur 0.2 0.2 - 1.0 mg/dL   Nitrite, UA Negative Negative   Microscopic Examination See below:   Bladder Scan (Post Void Residual) in office  Result Value Ref Range   Scan Result 28ml    Assessment & Plan:   1. Urge incontinence of urine UA today with pyuria, though with no reports of dysuria, will defer antibiotics pending urine culture results.  I offered her a switch in her pharmacotherapy to [facility] and she excepted.  Samples provided today.  Will see her back in clinic for symptom recheck and PVR. - Bladder Scan (Post Void Residual) in office - Urinalysis, Complete -  CULTURE, URINE COMPREHENSIVE - Vibegron (GEMTESA) 75 MG TABS; Take 1 tablet (75 mg total) by mouth daily.   Return in about 6 weeks (around 11/11/2022) for Symptom recheck with PVR.  Carman Ching, PA-C  [facility] [place] [address] [place], [place] [address] [phone]

## 2022-10-03 LAB — CULTURE, URINE COMPREHENSIVE

## 2022-10-13 ENCOUNTER — Encounter: Payer: Self-pay | Admitting: Student in an Organized Health Care Education/Training Program

## 2022-10-13 ENCOUNTER — Ambulatory Visit: Payer: Medicare HMO | Admitting: Student in an Organized Health Care Education/Training Program

## 2022-10-13 VITALS — BP 126/80 | HR 73 | Temp 97.8°F | Ht 69.0 in | Wt 198.0 lb

## 2022-10-13 DIAGNOSIS — J849 Interstitial pulmonary disease, unspecified: Secondary | ICD-10-CM | POA: Diagnosis not present

## 2022-10-13 MED ORDER — PREDNISONE 10 MG PO TABS
ORAL_TABLET | ORAL | 0 refills | Status: DC
Start: 2022-10-13 — End: 2022-11-22

## 2022-10-13 MED ORDER — SULFAMETHOXAZOLE-TRIMETHOPRIM 800-160 MG PO TABS
1.0000 | ORAL_TABLET | ORAL | 0 refills | Status: DC
Start: 2022-10-14 — End: 2022-11-22

## 2022-10-13 NOTE — Progress Notes (Signed)
Synopsis: Referred in for ILD by Jacky Kindle, FNP  Assessment & Plan:   1. ILD (interstitial lung disease) (HCC) 2. Shortness of Breath  Patient is presenting for follow up with imaging consistent with ILD. Discussed during ILD board and the consensus is that this is likely hypersensitivity pneumonitis, pending filling of the ILD questionnaire (which she has not done). Symptoms are unchanged during today's visit.  The ILD board recommendation favors hypersensitivity pneumonitis. Workup has included auto-immune and CTD testing (negative ANA, negative myositis workup, negative vasculitis workup), a high resolution chest CT, esophagogram (no aspiration/reflux), and a negative hypersensitivity pneumonitis panel.   Given all negative workup, and the imaging suggestive of HSP, I did discuss with the patient recommendation for bronchoscopy and transbronchial biopsy. She remains hesitant regarding bronchoscopy especially due to the risk from anesthesia.   I offered Eileen Flores options of surgical lung biopsy (wedge), flexible bronchoscopy with transbronchial biopsy, empiric treatment with prednisone, and watchful waiting. She would like to proceed with empiric treatment of hypersensitivity pneumonitis with prednisone with prophylactic bactrim. Patient will follow up after completing her course for repeat imaging.  - predniSONE (DELTASONE) 10 MG tablet; Take 4 tablets (40 mg total) by mouth daily with breakfast for 21 days, THEN 3 tablets (30 mg total) daily with breakfast for 21 days, THEN 2 tablets (20 mg total) daily with breakfast for 21 days, THEN 1 tablet (10 mg total) daily with breakfast for 21 days.  Dispense: 210 tablet; Refill: 0 - sulfamethoxazole-trimethoprim (BACTRIM DS) 800-160 MG tablet; Take 1 tablet by mouth 3 (three) times a week.  Dispense: 36 tablet; Refill: 0   Return in about 3 months (around 01/12/2023).  I spent 30 minutes caring for this patient today, including preparing  to see the patient, obtaining a medical history , reviewing a separately obtained history, performing a medically appropriate examination and/or evaluation, counseling and educating the patient/family/caregiver, ordering medications, tests, or procedures, documenting clinical information in the electronic health record, and independently interpreting results (not separately reported/billed) and communicating results to the patient/family/caregiver  [name], MD [facility] 10/13/2022 6:24 PM    End of visit medications:  Meds ordered this encounter  Medications   predniSONE (DELTASONE) 10 MG tablet    Sig: Take 4 tablets (40 mg total) by mouth daily with breakfast for 21 days, THEN 3 tablets (30 mg total) daily with breakfast for 21 days, THEN 2 tablets (20 mg total) daily with breakfast for 21 days, THEN 1 tablet (10 mg total) daily with breakfast for 21 days.    Dispense:  210 tablet    Refill:  0   sulfamethoxazole-trimethoprim (BACTRIM DS) 800-160 MG tablet    Sig: Take 1 tablet by mouth 3 (three) times a week.    Dispense:  36 tablet    Refill:  0     Current Outpatient Medications:    Calcium Carb-Cholecalciferol 600-10 MG-MCG CHEW, Chew by mouth., Disp: , Rfl:    conjugated estrogens (PREMARIN) vaginal cream, Place 1 applicator vaginally. 3 times a week, Disp: , Rfl:    Cranberry-Vitamin C-Probiotic (AZO CRANBERRY PO), Take by mouth as needed., Disp: , Rfl:    levothyroxine (SYNTHROID) 112 MCG tablet, Take 112 mcg by mouth daily., Disp: , Rfl:    lisinopril (ZESTRIL) 20 MG tablet, Take 2 tablets (40 mg total) by mouth daily. (Patient taking differently: Take 20 mg by mouth daily.), Disp: 180 tablet, Rfl: 3   predniSONE (DELTASONE) 10 MG tablet, Take  4 tablets (40 mg total) by mouth daily with breakfast for 21 days, THEN 3 tablets (30 mg total) daily with breakfast for 21 days, THEN 2 tablets (20 mg total) daily with breakfast for 21 days, THEN 1 tablet (10 mg  total) daily with breakfast for 21 days., Disp: 210 tablet, Rfl: 0   [START ON 10/14/2022] sulfamethoxazole-trimethoprim (BACTRIM DS) 800-160 MG tablet, Take 1 tablet by mouth 3 (three) times a week., Disp: 36 tablet, Rfl: 0   timolol (TIMOPTIC) 0.5 % ophthalmic solution, Place 1 drop into both eyes 2 (two) times daily., Disp: , Rfl:    Vibegron (GEMTESA) 75 MG TABS, Take 1 tablet (75 mg total) by mouth daily., Disp: , Rfl:    Subjective:   PATIENT ID: Eileen Flores GENDER: female DOB: 02-Dec-1935, MRN: [id number]  Chief Complaint  Patient presents with   Follow-up    SOB with exertion and dry cough at times prod with white sputum.    HPI  The patient is a pleasant 87 year old female presenting to clinic for follow up regarding imaging findings that are suggestive of ILD.  Interval history shows no change in symptoms. Patient continues to report shortness of breath, especially with exertion. She doesn't report any new cough, sputum production, fevers, chills, night sweats, or otherwise any systemic inflammatory symptoms. I first saw Mr. Vonstein in January of 2023 for an initial visit.   At that time, she reported being in her usual state of health. Her symptoms mostly revolved around exertional dyspnea, and a rare cough. She is not very active and uses a walker to ambulate. She reports joint pains that are mostly in her knees, something she's dealt with for a long period of time. She does not report difficulty swallowing but does report symptoms of reflux. No rashes reported, and no light sensitivity. No ulcers reported, and no easy bruising.   She was found to have radiographic pulmonary abnormalities on an abdominal CT in 2022, followed by a dedicated chest CT in March of 2023. Following our initial visit, we did discuss her images and history at the multi-disciplinary ILD board, and the recommendation was to administer the ILD questionnaire and consider flexible bronchoscopy with transbronchial  biopsies. I had discussed this with Eileen Flores during our prior visit and she was hesitant to proceed with procedure given her age and risk of complications.   She lives in a mobile home and has lived there for the past 25 years. She does have old carpet in her bedroom. There is no basement. She is not aware of any water damage or leakage in the house, and has not seen any mold. This was again revisited today. She did not fill her ILD questionnaire prior to today's visit.   She does not have any hot tubs or Jacuzzi. Water source is a well on their property, thou they do not drink from it; they drink bottled water. She does not have any birds, and uses cotton in her bedding; she is not aware of any dawn or feathers in her comforter/pillows. She does not have any family history of lung disease and no personal history of lung disease. She smoked socially in her teen years. She has no occupational exposures and worked as a Haematologist in the past. She reports a history of UTI's for which she is on Mirabegron. She did receive Nitrofurantoin in September of 2023.  Ancillary information including prior medications, full medical/surgical/family/social histories, and PFTs (when available) are listed below  and have been reviewed.   Review of Systems  Constitutional:  Negative for chills, diaphoresis, fever, malaise/fatigue and weight loss.  Respiratory:  Positive for shortness of breath. Negative for cough, hemoptysis and wheezing.   Cardiovascular:  Negative for chest pain and palpitations.  Musculoskeletal:  Positive for joint pain.  Skin:  Negative for itching and rash.     Objective:   Vitals:   10/13/22 1334  BP: 126/80  Pulse: 73  Temp: 97.8 F (36.6 C)  TempSrc: Temporal  SpO2: 95%  Weight: 198 lb (89.8 kg)  Height: 5\' 9"  (1.753 m)   95% on RA  BMI Readings from Last 3 Encounters:  10/13/22 29.24 kg/m  09/30/22 29.43 kg/m  08/15/22 29.24 kg/m   Wt Readings from Last 3 Encounters:   10/13/22 198 lb (89.8 kg)  09/30/22 199 lb 4.8 oz (90.4 kg)  08/15/22 198 lb (89.8 kg)    Physical Exam Constitutional:      General: She is not in acute distress.    Appearance: Normal appearance. She is not ill-appearing.  HENT:     Head: Normocephalic.     Nose: Nose normal.     Mouth/Throat:     Mouth: Mucous membranes are moist.  Eyes:     Pupils: Pupils are equal, round, and reactive to light.  Cardiovascular:     Rate and Rhythm: Normal rate and regular rhythm.     Pulses: Normal pulses.     Heart sounds: Normal heart sounds.  Pulmonary:     Effort: Pulmonary effort is normal. No respiratory distress.     Breath sounds: Rales present. No wheezing.  Abdominal:     Palpations: Abdomen is soft.  Musculoskeletal:        General: Normal range of motion.  Skin:    General: Skin is warm.  Neurological:     General: No focal deficit present.     Mental Status: She is alert and oriented to person, place, and time. Mental status is at baseline.       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%; PR 90%; MammoSite January 2015, DECLINED ANTI-ESTROGEN TREATMENT   Mammographic microcalcification    Syncope and collapse    Wears hearing aid      Family History  Problem Relation Age of Onset   Cancer Mother 46       liver cancer   Cancer Father 52       liver cancer   Alcohol abuse Father    Heart attack Brother    Colon cancer Other    Colon polyps Other    Breast cancer Neg Hx      Past Surgical History:  Procedure Laterality  Date   BREAST BIOPSY Left 2014   stereo biopsy   BREAST EXCISIONAL BIOPSY Right 2014   papilloma   BREAST LUMPECTOMY Left 2014   BREAST SURGERY Left Oct 2012   benign stereo biopsy   BREAST SURGERY Right 01-14-13  excision breast mass, intraductal papilloma   BREAST SURGERY Left 01-14-13   Excision intra-papillary carcinoma, DCIS, ER 90%, PR 90%.   cataract surgery Bilateral 2009   COLONOSCOPY  2011   Dr. Lemar Livings   dermbrasion   1965   OVARIAN CYST REMOVAL  1967   POLYPECTOMY  2006   THYROIDECTOMY  2006   TONGUE SURGERY  2009    Social History   Socioeconomic History   Marital status: Widowed    Spouse name: Not on file   Number of children: 2   Years of education: Not on file   Highest education level: Some college, no degree  Occupational History   Not on file  Tobacco Use   Smoking status: Former    Types: Cigarettes   Smokeless tobacco: Never   Tobacco comments:    quit in mid to late 20's  Vaping Use   Vaping status: Never Used  Substance and Sexual Activity   Alcohol use: No    Comment: rare- 1 drink   Drug use: No   Sexual activity: Not Currently  Other Topics Concern   Not on file  Social History Narrative   Not on file   Social Determinants of Health   Financial Resource Strain: Low Risk  (11/03/2021)   Overall Financial Resource Strain (CARDIA)    Difficulty of Paying Living Expenses: Not hard at all  Food Insecurity: No Food Insecurity (04/18/2022)   Hunger Vital Sign    Worried About Running Out of Food in the Last Year: Never true    Ran Out of Food in the Last Year: Never true  Transportation Needs: No Transportation Needs (04/18/2022)   PRAPARE - Administrator, Civil Service (Medical): No    Lack of Transportation (Non-Medical): No  Physical Activity: Insufficiently Active (11/03/2021)   Exercise Vital Sign    Days of Exercise per Week: 3 days    Minutes of Exercise per Session: 30 min  Stress: No Stress Concern Present  (11/03/2021)   [employer] of Occupational Health - Occupational Stress Questionnaire    Feeling of Stress : Only a little  Social Connections: Socially Isolated (11/03/2021)   Social Connection and Isolation Panel [NHANES]    Frequency of Communication with Friends and Family: Twice a week    Frequency of Social Gatherings with Friends and Family: Once a week    Attends Religious Services: Never    Database administrator or Organizations: No    Attends Banker Meetings: Never    Marital Status: Widowed  Intimate Partner Violence: Not At Risk (11/03/2021)   Humiliation, Afraid, Rape, and Kick questionnaire    Fear of Current or Ex-Partner: No    Emotionally Abused: No    Physically Abused: No    Sexually Abused: No     Allergies  Allergen Reactions   Penicillin G Rash   Penicillins Rash     CBC    Component Value Date/Time   WBC 8.8 04/15/2022 2241   RBC 4.41 04/15/2022 2241   HGB 13.6 04/15/2022 2241   HGB 14.6 03/09/2022 1624   HCT 42.2 04/15/2022 2241   HCT 43.5 03/09/2022 1624   PLT 311 04/15/2022 2241   PLT 339 03/09/2022 1624   MCV 95.7 04/15/2022 2241   MCV 93 03/09/2022 1624   MCV 93 07/07/2011 1555   MCH 30.8 04/15/2022 2241   MCHC 32.2 04/15/2022 2241   RDW 12.8 04/15/2022 2241   RDW 11.7 03/09/2022 1624  RDW 13.6 07/07/2011 1555   LYMPHSABS 2.1 03/09/2022 1624   MONOABS 0.7 11/24/2020 1919   EOSABS 0.2 03/09/2022 1624   BASOSABS 0.1 03/09/2022 1624    Pulmonary Functions Testing Results:    Latest Ref Rng & Units 04/07/2022    2:56 PM  PFT Results  FVC-Pre L 2.30   FVC-Predicted Pre % 76   Pre FEV1/FVC % % 73   FEV1-Pre L 1.68   FEV1-Predicted Pre % 74   DLCO uncorrected ml/min/mmHg 9.31   DLCO UNC% % 43   DLVA Predicted % 70   TLC L 4.31   TLC % Predicted % 75   RV % Predicted % 89     Outpatient Medications Prior to Visit  Medication Sig Dispense Refill   Calcium Carb-Cholecalciferol 600-10 MG-MCG CHEW Chew by mouth.      conjugated estrogens (PREMARIN) vaginal cream Place 1 applicator vaginally. 3 times a week     Cranberry-Vitamin C-Probiotic (AZO CRANBERRY PO) Take by mouth as needed.     levothyroxine (SYNTHROID) 112 MCG tablet Take 112 mcg by mouth daily.     lisinopril (ZESTRIL) 20 MG tablet Take 2 tablets (40 mg total) by mouth daily. (Patient taking differently: Take 20 mg by mouth daily.) 180 tablet 3   timolol (TIMOPTIC) 0.5 % ophthalmic solution Place 1 drop into both eyes 2 (two) times daily.     Vibegron (GEMTESA) 75 MG TABS Take 1 tablet (75 mg total) by mouth daily.     No facility-administered medications prior to visit.

## 2022-10-17 ENCOUNTER — Encounter: Payer: Self-pay | Admitting: Student in an Organized Health Care Education/Training Program

## 2022-10-17 NOTE — Telephone Encounter (Signed)
Can we please schedule her for a 30 min follow up over the next few weeks. Thank you

## 2022-11-07 ENCOUNTER — Ambulatory Visit (INDEPENDENT_AMBULATORY_CARE_PROVIDER_SITE_OTHER): Payer: Medicare HMO

## 2022-11-07 VITALS — Ht 69.0 in | Wt 198.0 lb

## 2022-11-07 DIAGNOSIS — Z0000 Encounter for general adult medical examination without abnormal findings: Secondary | ICD-10-CM

## 2022-11-07 NOTE — Progress Notes (Signed)
Subjective:   Eileen Flores is a 87 y.o. female who presents for Medicare Annual (Subsequent) preventive examination.  Visit Complete: Virtual  I connected with  Eileen Flores on 11/07/22 by a audio enabled telemedicine application and verified that I am speaking with the correct person using two identifiers.  Patient Location: Home  Provider Location: Office/Clinic  I discussed the limitations of evaluation and management by telemedicine. The patient expressed understanding and agreed to proceed.  Because this visit was a virtual/telehealth visit, some criteria may be missing or patient reported. Any vitals not documented were not able to be obtained and vitals that have been documented are patient reported.   Patient Medicare AWV questionnaire was completed by the patient on (not done); I have confirmed that all information answered by patient is correct and no changes since this date. Cardiac Risk Factors include: advanced age (>39men, >94 women);dyslipidemia;hypertension;sedentary lifestyle    Objective:    Today's Vitals   11/07/22 1037  Weight: 198 lb (89.8 kg)  Height: 5\' 9"  (1.753 m)   Body mass index is 29.24 kg/m.     11/07/2022   10:49 AM 04/15/2022    9:30 PM 12/02/2021    4:07 PM 11/03/2021    1:54 PM 10/09/2021    2:17 PM 11/02/2020    5:14 PM 06/19/2019    1:30 PM  Advanced Directives  Does Patient Have a Medical Advance Directive? No No No No No No No  Would patient like information on creating a medical advance directive?   No - Patient declined No - Patient declined  No - Patient declined No - Patient declined    Current Medications (verified) Outpatient Encounter Medications as of 11/07/2022  Medication Sig   Calcium Carb-Cholecalciferol 600-10 MG-MCG CHEW Chew by mouth.   conjugated estrogens (PREMARIN) vaginal cream Place 1 applicator vaginally. 3 times a week   levothyroxine (SYNTHROID) 112 MCG tablet Take 112 mcg by mouth daily.   lisinopril  (ZESTRIL) 20 MG tablet Take 2 tablets (40 mg total) by mouth daily. (Patient taking differently: Take 20 mg by mouth daily.)   timolol (TIMOPTIC) 0.5 % ophthalmic solution Place 1 drop into both eyes 2 (two) times daily.   Vibegron (GEMTESA) 75 MG TABS Take 1 tablet (75 mg total) by mouth daily.   Cranberry-Vitamin C-Probiotic (AZO CRANBERRY PO) Take by mouth as needed. (Patient not taking: Reported on 11/07/2022)   predniSONE (DELTASONE) 10 MG tablet Take 4 tablets (40 mg total) by mouth daily with breakfast for 21 days, THEN 3 tablets (30 mg total) daily with breakfast for 21 days, THEN 2 tablets (20 mg total) daily with breakfast for 21 days, THEN 1 tablet (10 mg total) daily with breakfast for 21 days. (Patient not taking: Reported on 11/07/2022)   sulfamethoxazole-trimethoprim (BACTRIM DS) 800-160 MG tablet Take 1 tablet by mouth 3 (three) times a week. (Patient not taking: Reported on 11/07/2022)   No facility-administered encounter medications on file as of 11/07/2022.    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   cataract surgery Bilateral 2009   COLONOSCOPY  2011   Dr. Lemar Livings   dermbrasion   1965   OVARIAN CYST REMOVAL  1967   POLYPECTOMY  2006   THYROIDECTOMY  2006   TONGUE SURGERY  2009   Family History  Problem Relation Age of Onset   Cancer Mother 93       liver cancer   Cancer Father 83       liver cancer   Alcohol abuse Father    Heart attack Brother    Colon cancer Other    Colon polyps Other    Breast cancer Neg Hx    Social History   Socioeconomic History   Marital status: Widowed    Spouse name: Not on file   Number of children: 2   Years of education: Not on file   Highest education level: Some college, no degree  Occupational History   Not on file  Tobacco Use   Smoking status: Former    Types: Cigarettes   Smokeless tobacco: Never   Tobacco comments:    quit in mid to late 20's  Vaping Use   Vaping status: Never Used  Substance and Sexual Activity   Alcohol use: No    Comment: rare- 1 drink   Drug use: No   Sexual activity: Not Currently  Other Topics Concern   Not on file  Social History Narrative   Not on file   Social Determinants of Health   Financial Resource Strain: Low Risk  (11/07/2022)   Overall Financial Resource Strain (CARDIA)    Difficulty of Paying Living Expenses: Not very hard  Food Insecurity: No Food Insecurity (11/07/2022)   Hunger Vital Sign    Worried About Running Out of Food in the Last Year: Never true    Ran Out of Food in the Last Year: Never true  Transportation Needs: No Transportation Needs (11/07/2022)   PRAPARE - Administrator, Civil Service (Medical): No    Lack of Transportation (Non-Medical): No  Physical Activity: Inactive (11/07/2022)   Exercise Vital Sign    Days of Exercise per Week: 0 days    Minutes of Exercise per Session: 0 min  Stress: No Stress Concern  Present (11/07/2022)   [employer] of Occupational Health - Occupational Stress Questionnaire    Feeling of Stress : Only a little  Social Connections: Moderately Isolated (11/07/2022)   Social Connection and Isolation Panel [NHANES]    Frequency of Communication with Friends and Family: Twice a week    Frequency of Social Gatherings with Friends and Family: Once a week    Attends Religious Services: More than 4 times per year    Active Member of [employer] or Organizations: No    Attends Banker Meetings: Never  Marital Status: Widowed    Tobacco Counseling Counseling given: Not Answered Tobacco comments: quit in mid to late 20's   Clinical Intake:  Pre-visit preparation completed: Yes  Pain : No/denies pain     BMI - recorded: 29.24 Nutritional Status: BMI 25 -29 Overweight Nutritional Risks: None Diabetes: No  How often do you need to have someone help you when you read instructions, pamphlets, or other written materials from your doctor or pharmacy?: 1 - Never  Interpreter Needed?: No  Comments: son lives with pt Information entered by :: B.Landin Tallon,LPN   Activities of Daily Living    11/07/2022   10:53 AM 04/19/2022    1:59 PM  In your present state of health, do you have any difficulty performing the following activities:  Hearing? 1 1  Vision? 0 1  Difficulty concentrating or making decisions? 0 0  Walking or climbing stairs? 1 1  Dressing or bathing? 0 0  Doing errands, shopping? 0 1  Preparing Food and eating ? N   Using the Toilet? N   In the past six months, have you accidently leaked urine? Y   Do you have problems with loss of bowel control? N   Managing your Medications? N   Managing your Finances? N   Housekeeping or managing your Housekeeping? N     Patient Care Team: Jacky Kindle, FNP as PCP - General (Family Medicine) Debbe Odea, MD as PCP - Cardiology (Cardiology) Lemar Livings Merrily Pew, MD (General  Surgery) Lockie Mola, MD as Referring Physician (Ophthalmology) Vernie Murders, MD (Otolaryngology) Kemper Durie, RN as [facility] Care Management  Indicate any recent Medical Services you may have received from other than [facility] providers in the past year (date may be approximate).     Assessment:   This is a routine wellness examination for Pierce.  Hearing/Vision screen Hearing Screening - Comments:: Pt says she has hearing aids:does not need all the time Vision Screening - Comments:: Pt says she wears glasses sometimes mostly for reading   Goals Addressed             This Visit's Progress    COMPLETED: DIET - EAT MORE FRUITS AND VEGETABLES   On track    Recommend increasing fruits and vegetables in diet to at least two servings of each a day.      COMPLETED: DIET - EAT MORE FRUITS AND VEGETABLES       Exercise 3x per week (30 min per time)   Not on track    Recommend to exercise for 3 days a week for at least 30 minutes at a time.      COMPLETED: Increase water intake   On track    Recommend increasing water intake to 4-5 glasses a day.       Depression Screen    11/07/2022   10:46 AM 04/19/2022    1:58 PM 02/10/2022    1:55 PM 12/06/2021   10:58 AM 11/03/2021    1:50 PM 11/02/2020    5:09 PM 06/19/2019    1:27 PM  PHQ 2/9 Scores  PHQ - 2 Score 1 0 0 0 1 0 1  PHQ- 9 Score  2  2 3       Fall Risk    11/07/2022   10:40 AM 04/19/2022    1:58 PM 02/10/2022    1:55 PM 12/06/2021   10:58 AM 11/03/2021    1:54 PM  Fall Risk   Falls in the past year? 0  1 0 0 0  Number falls in past yr: 0 0 0 0 0  Injury with Fall? 0 0 0 0 0  Risk for fall due to : No Fall Risks  No Fall Risks No Fall Risks No Fall Risks  Follow up Education provided;Falls prevention discussed   Falls evaluation completed Falls prevention discussed;Falls evaluation completed    MEDICARE RISK AT HOME: Medicare Risk at Home Any stairs in or around the home?: Yes If so, are there any  without handrails?: Yes Home free of loose throw rugs in walkways, pet beds, electrical cords, etc?: Yes Adequate lighting in your home to reduce risk of falls?: Yes Life alert?: No Use of a cane, walker or w/c?: Yes (cane) Grab bars in the bathroom?: No Shower chair or bench in shower?: Yes Elevated toilet seat or a handicapped toilet?: No  TIMED UP AND GO:  Was the test performed?  No    Cognitive Function:        11/07/2022   10:58 AM 11/03/2021    1:56 PM 11/02/2020    5:16 PM 06/19/2019    1:39 PM 06/18/2018   11:09 AM  6CIT Screen  What Year? 0 points 0 points 0 points 0 points 0 points  What month? 0 points 0 points 0 points 0 points 0 points  What time? 0 points 0 points 0 points 0 points 0 points  Count back from 20 0 points 0 points 0 points 0 points 0 points  Months in reverse 0 points 0 points 0 points 0 points 0 points  Repeat phrase 0 points 0 points 0 points 0 points 0 points  Total Score 0 points 0 points 0 points 0 points 0 points    Immunizations Immunization History  Administered Date(s) Administered   Fluad Quad(high Dose 65+) 10/22/2018, 12/21/2020   Influenza, High Dose Seasonal PF 11/21/2013, 11/05/2016, 11/28/2017   PNEUMOCOCCAL CONJUGATE-20 12/21/2020   Pneumococcal Conjugate-13 07/05/2017   Pneumococcal-Unspecified 01/14/2013    TDAP status: Up to date  Flu Vaccine status: Due, Education has been provided regarding the importance of this vaccine. Advised may receive this vaccine at local pharmacy or Health Dept. Aware to provide a copy of the vaccination record if obtained from local pharmacy or Health Dept. Verbalized acceptance and understanding.  Pneumococcal vaccine status: Up to date  Covid-19 vaccine status: Completed vaccines  Qualifies for Shingles Vaccine? Yes   Zostavax completed No   Shingrix Completed?: No.    Education has been provided regarding the importance of this vaccine. Patient has been advised to call insurance company to  determine out of pocket expense if they have not yet received this vaccine. Advised may also receive vaccine at local pharmacy or Health Dept. Verbalized acceptance and understanding.  Screening Tests Health Maintenance  Topic Date Due   DTaP/Tdap/Td (1 - Tdap) Never done   Zoster Vaccines- Shingrix (1 of 2) Never done   INFLUENZA VACCINE  05/08/2023 (Originally 09/08/2022)   Medicare Annual Wellness (AWV)  11/07/2023   Pneumonia Vaccine 85+ Years old  Completed   HPV VACCINES  Aged Out   DEXA SCAN  Discontinued   COVID-19 Vaccine  Discontinued    Health Maintenance  Health Maintenance Due  Topic Date Due   DTaP/Tdap/Td (1 - Tdap) Never done   Zoster Vaccines- Shingrix (1 of 2) Never done    Colorectal cancer screening: No longer required.   Mammogram status: No longer required due to age.  Lung Cancer Screening: (Low Dose  CT Chest recommended if Age 75-80 years, 20 pack-year currently smoking OR have quit w/in 15years.) does not qualify.   Lung Cancer Screening Referral: no  Additional Screening:  Hepatitis C Screening: does not qualify; Completed yes  Vision Screening: Recommended annual ophthalmology exams for early detection of glaucoma and other disorders of the eye. Is the patient up to date with their annual eye exam?  Yes  Who is the provider or what is the name of the office in which the patient attends annual eye exams? Dr Inez Pilgrim If pt is not established with a provider, would they like to be referred to a provider to establish care? No .   Dental Screening: Recommended annual dental exams for proper oral hygiene  Diabetic Foot Exam: n/a  Community Resource Referral / Chronic Care Management: CRR required this visit?  No   CCM required this visit?  No     Plan:     I have personally reviewed and noted the following in the patient's chart:   Medical and social history Use of alcohol, tobacco or illicit drugs  Current medications and supplements  including opioid prescriptions. Patient is not currently taking opioid prescriptions. Functional ability and status Nutritional status Physical activity Advanced directives List of other physicians Hospitalizations, surgeries, and ER visits in previous 12 months Vitals Screenings to include cognitive, depression, and falls Referrals and appointments  In addition, I have reviewed and discussed with patient certain preventive protocols, quality metrics, and best practice recommendations. A written personalized care plan for preventive services as well as general preventive health recommendations were provided to patient.     Sue Lush, LPN   1/61/0960   After Visit Summary: (MyChart) Due to this being a telephonic visit, the after visit summary with patients personalized plan was offered to patient via MyChart   Nurse Notes: The patient states she is doing well and has no concerns or questions at this time.

## 2022-11-07 NOTE — Patient Instructions (Signed)
Eileen Flores , Thank you for taking time to come for your Medicare Wellness Visit. I appreciate your ongoing commitment to your health goals. Please review the following plan we discussed and let me know if I can assist you in the future.   Referrals/Orders/Follow-Ups/Clinician Recommendations: none  This is a list of the screening recommended for you and due dates:  Health Maintenance  Topic Date Due   DTaP/Tdap/Td vaccine (1 - Tdap) Never done   Zoster (Shingles) Vaccine (1 of 2) Never done   Flu Shot  05/08/2023*   Medicare Annual Wellness Visit  11/07/2023   Pneumonia Vaccine  Completed   HPV Vaccine  Aged Out   DEXA scan (bone density measurement)  Discontinued   COVID-19 Vaccine  Discontinued  *Topic was postponed. The date shown is not the original due date.    Advanced directives: (Declined) Advance directive discussed with you today. Even though you declined this today, please call our office should you change your mind, and we can give you the proper paperwork for you to fill out.  Next Medicare Annual Wellness Visit scheduled for next year: Yes 11/08/23 @ 10:15 am telephone

## 2022-11-08 ENCOUNTER — Ambulatory Visit: Payer: Medicare HMO | Admitting: Student in an Organized Health Care Education/Training Program

## 2022-11-08 DIAGNOSIS — E890 Postprocedural hypothyroidism: Secondary | ICD-10-CM | POA: Diagnosis not present

## 2022-11-11 ENCOUNTER — Ambulatory Visit (INDEPENDENT_AMBULATORY_CARE_PROVIDER_SITE_OTHER): Payer: Medicare HMO | Admitting: Physician Assistant

## 2022-11-11 DIAGNOSIS — N3941 Urge incontinence: Secondary | ICD-10-CM | POA: Diagnosis not present

## 2022-11-11 LAB — BLADDER SCAN AMB NON-IMAGING: Scan Result: 93 mL

## 2022-11-11 MED ORDER — GEMTESA 75 MG PO TABS
75.0000 mg | ORAL_TABLET | Freq: Every day | ORAL | 11 refills | Status: DC
Start: 2022-11-11 — End: 2023-02-09

## 2022-11-11 NOTE — Progress Notes (Signed)
11/11/2022 3:27 PM   Eileen Flores [birth year] [id number]  CC: Chief Complaint  Patient presents with   Follow-up   HPI: Eileen Flores is a 87 y.o. female with PMH OAB wet previously on Myrbetriq 25 mg and recurrent UTI on topical vaginal estrogen cream who presents today for symptom recheck on Gemtesa.   Today she reports symptomatic improvement on Gemtesa.  She is now having only occasional small-volume leaks.  She is pleased with her progress and wishes to continue this medication.  PVR 93mL.  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   cataract surgery Bilateral 2009   COLONOSCOPY  2011   Dr. Lemar Livings   dermbrasion   1965   OVARIAN CYST REMOVAL  1967   POLYPECTOMY  2006   THYROIDECTOMY  2006   TONGUE SURGERY  2009    Home Medications:  Allergies as of 11/11/2022       Reactions   Penicillin G Rash   Penicillins Rash        Medication List        Accurate as of November 11, 2022  3:27  PM. If you have any questions, ask your nurse or doctor.          AZO CRANBERRY PO Take by mouth as needed.   Calcium Carb-Cholecalciferol 600-10 MG-MCG Chew Chew by mouth.   conjugated estrogens vaginal cream Commonly known as: PREMARIN Place 1 applicator vaginally. 3 times a week   Gemtesa 75 MG Tabs Generic drug: Vibegron Take 1 tablet (75 mg total) by mouth daily.   levothyroxine 112 MCG tablet Commonly known as: SYNTHROID Take 112 mcg by mouth daily.   lisinopril 20 MG tablet Commonly known as: ZESTRIL Take 2 tablets (40 mg total) by mouth daily. What changed: how much to take   predniSONE 10 MG tablet Commonly known as: DELTASONE Take 4 tablets (40 mg total) by mouth daily with breakfast for 21 days, THEN 3 tablets (30 mg total) daily with breakfast for 21 days, THEN 2 tablets (20 mg total) daily with breakfast for 21 days, THEN 1 tablet (10 mg total) daily with breakfast for 21 days. Start taking on: October 13, 2022   sulfamethoxazole-trimethoprim 800-160 MG tablet Commonly known as: BACTRIM DS Take 1 tablet by mouth 3 (three) times a week.   timolol 0.5 % ophthalmic solution Commonly known as: TIMOPTIC Place 1 drop  into both eyes 2 (two) times daily.        Allergies:  Allergies  Allergen Reactions   Penicillin G Rash   Penicillins Rash    Family History: Family History  Problem Relation Age of Onset   Cancer Mother 47       liver cancer   Cancer Father 45       liver cancer   Alcohol abuse Father    Heart attack Brother    Colon cancer Other    Colon polyps Other    Breast cancer Neg Hx     Social History:   reports that she has quit smoking. Her smoking use included cigarettes. She has never used smokeless tobacco. She reports that she does not drink alcohol and does not use drugs.  Physical Exam: There were no vitals taken for this visit.  Constitutional:  Alert and oriented, no acute distress, nontoxic appearing HEENT: [place],  AT Cardiovascular: No clubbing, cyanosis, or edema Respiratory: Normal respiratory effort, no increased work of breathing Skin: No rashes, bruises or suspicious lesions Neurologic: Grossly intact, no focal deficits, moving all 4 extremities Psychiatric: Normal mood and affect  Laboratory Data: Results for orders placed or performed in visit on 11/11/22  BLADDER SCAN AMB NON-IMAGING  Result Value Ref Range   Scan Result 93 ml   Assessment & Plan:   1. Urge incontinence of urine Symptoms well-controlled on Gemtesa and she is emptying appropriately.  Will plan to continue this.  We discussed that if Leslye Peer is cost prohibitive, alternatives include Myrbetriq 50 mg, trospium IR versus ER, or third line therapies.  - Vibegron (GEMTESA) 75 MG TABS; Take 1 tablet (75 mg total) by mouth daily.  Dispense: 30 tablet; Refill: 11 - BLADDER SCAN AMB NON-IMAGING   Return if symptoms worsen or fail to improve.  Carman Ching, PA-C  [facility] [place] [address] [place], [place] [address] [phone]

## 2022-11-12 ENCOUNTER — Other Ambulatory Visit: Payer: Self-pay | Admitting: Family Medicine

## 2022-11-12 DIAGNOSIS — I10 Essential (primary) hypertension: Secondary | ICD-10-CM

## 2022-11-14 NOTE — Telephone Encounter (Signed)
Requested Prescriptions  Pending Prescriptions Disp Refills   lisinopril (ZESTRIL) 20 MG tablet [Pharmacy Med Name: LISINOPRIL 20 MG TABLET] 180 tablet 3    Sig: TAKE 2 TABLETS (40 MG TOTAL) BY MOUTH DAILY.     Cardiovascular:  ACE Inhibitors Failed - 11/12/2022  3:57 PM      Failed - Cr in normal range and within 180 days    Creatinine  Date Value Ref Range Status  07/07/2011 0.95 0.60 - 1.30 mg/dL Final   Creatinine, Ser  Date Value Ref Range Status  04/15/2022 0.92 0.44 - 1.00 mg/dL Final         Failed - K in normal range and within 180 days    Potassium  Date Value Ref Range Status  04/15/2022 3.9 3.5 - 5.1 mmol/L Final  07/07/2011 3.7 3.5 - 5.1 mmol/L Final         Passed - Patient is not pregnant      Passed - Last BP in normal range    BP Readings from Last 1 Encounters:  10/13/22 126/80         Passed - Valid encounter within last 6 months    Recent Outpatient Visits           6 months ago Primary hypertension   [facility] [place], [employer], PA-C   9 months ago Annual physical exam   [facility] Jacky Kindle, FNP   9 months ago Urgency of urination   [facility] Jacky Kindle, FNP   11 months ago Gastroenteritis   [facility] Merita Norton T, FNP   12 months ago Urinary frequency   [facility] [place], [place], [place]       Future Appointments             In 1 week [name], MD [facility] at [place]   In 1 month Ag[place], Arlys John, MD [facility] [place]   In 2 months [name], MD [facility] at [place]   In 9 months Carman Ching, Cordelia Poche [facility]

## 2022-11-16 DIAGNOSIS — E890 Postprocedural hypothyroidism: Secondary | ICD-10-CM | POA: Diagnosis not present

## 2022-11-16 DIAGNOSIS — L299 Pruritus, unspecified: Secondary | ICD-10-CM | POA: Diagnosis not present

## 2022-11-22 ENCOUNTER — Ambulatory Visit: Payer: Medicare HMO | Admitting: Student in an Organized Health Care Education/Training Program

## 2022-11-22 ENCOUNTER — Encounter: Payer: Self-pay | Admitting: Student in an Organized Health Care Education/Training Program

## 2022-11-22 VITALS — BP 150/80 | HR 67 | Temp 97.6°F | Ht 69.0 in | Wt 201.6 lb

## 2022-11-22 DIAGNOSIS — J849 Interstitial pulmonary disease, unspecified: Secondary | ICD-10-CM | POA: Diagnosis not present

## 2022-11-22 NOTE — Progress Notes (Signed)
Assessment & Plan:   1. ILD (interstitial lung disease) (HCC)  Patient is presenting for follow up with imaging consistent with ILD. Discussed during ILD board and the consensus is that this is likely hypersensitivity pneumonitis, pending filling of the ILD questionnaire (which she has not done). Symptoms are unchanged during today's visit.   The ILD board recommendation favors hypersensitivity pneumonitis. Workup has included auto-immune and CTD testing (negative ANA, negative myositis workup, negative vasculitis workup), a high resolution chest CT, esophagogram (no aspiration/reflux), and a negative hypersensitivity pneumonitis panel.   Given all negative workup, and the imaging suggestive of HSP, I did discuss with the patient recommendation for bronchoscopy and transbronchial biopsy. She remains hesitant regarding bronchoscopy especially due to the risk from anesthesia. During our last visit, I offered her the options of surgical lung biopsy (refused), bronchoscopy with transbronchial biopsy (refused), and empiric treatment with prednisone. Patient did not take the prednisone that was prescribed and now reports wanting watchful waiting. She did express understanding of her disease process and was able to state back to me that she is having inflammation in her lungs causing scarring. Given this, will obtain repeat imaging and PFT's prior to our next visit at the one year mark.  - Pulmonary Function Test [facility] Only; Future - CT CHEST HIGH RESOLUTION; Future   Return in about 5 months (around 04/22/2023).  I spent 30 minutes caring for this patient today, including preparing to see the patient, obtaining a medical history , reviewing a separately obtained history, performing a medically appropriate examination and/or evaluation, counseling and educating the patient/family/caregiver, ordering medications, tests, or procedures, and documenting clinical information in the electronic health  record  Eileen Chute, MD [facility] 11/22/2022 1:57 PM    End of visit medications:  No orders of the defined types were placed in this encounter.    Current Outpatient Medications:    Calcium Carb-Cholecalciferol 600-10 MG-MCG CHEW, Chew by mouth., Disp: , Rfl:    conjugated estrogens (PREMARIN) vaginal cream, Place 1 applicator vaginally. 3 times a week, Disp: , Rfl:    Cranberry-Vitamin C-Probiotic (AZO CRANBERRY PO), Take by mouth as needed., Disp: , Rfl:    levothyroxine (SYNTHROID) 112 MCG tablet, Take 112 mcg by mouth daily., Disp: , Rfl:    lisinopril (ZESTRIL) 20 MG tablet, TAKE 2 TABLETS (40 MG TOTAL) BY MOUTH DAILY., Disp: 180 tablet, Rfl: 0   timolol (TIMOPTIC) 0.5 % ophthalmic solution, Place 1 drop into both eyes 2 (two) times daily., Disp: , Rfl:    Vibegron (GEMTESA) 75 MG TABS, Take 1 tablet (75 mg total) by mouth daily., Disp: 30 tablet, Rfl: 11   Subjective:   PATIENT ID: Eileen Flores GENDER: female DOB: 10/24/1935, MRN: [id number]  Chief Complaint  Patient presents with   Follow-up    Shortness of breath on exertion. No cough or wheezing.     HPI  The patient is a pleasant 87 year old female presenting to clinic for follow up regarding imaging findings that are suggestive of ILD.   Interval history shows no change in symptoms. Patient continues to report shortness of breath, especially with exertion. She doesn't report any new cough, sputum production, fevers, chills, night sweats, or otherwise any systemic inflammatory symptoms.  Patient was prescribed prednisone and bactrim during our prior visit, but she reports not taking them. She does not want to be on any medications at this time. She remains uninterested in having bronchoscopy or a biopsy procedure.  Her  symptoms mostly revolved around exertional dyspnea, and a rare cough. She is not very active and uses a walker to ambulate. She reports joint pains that are mostly in her  knees, something she's dealt with for a long period of time. She does not report difficulty swallowing but does report symptoms of reflux. No rashes reported, and no light sensitivity. No ulcers reported, and no easy bruising. She does report some GI symptoms over the past few days that have been self resolving.   She was found to have radiographic pulmonary abnormalities on an abdominal CT in 2022, followed by a dedicated chest CT in March of 2023. Following our initial visit, we did discuss her images and history at the multi-disciplinary ILD board, and the recommendation was to administer the ILD questionnaire and consider flexible bronchoscopy with transbronchial biopsies. I had discussed this with Eileen Flores during our prior visit and she was hesitant to proceed with procedure given her age and risk of complications.   She lives in a mobile home and has lived there for the past 25 years. She does have old carpet in her bedroom. There is no basement. She is not aware of any water damage or leakage in the house, and has not seen any mold. This was again revisited today. She still has not filled the ILD questionnaire.   She does not have any hot tubs or Jacuzzi. Water source is a well on their property, thou they do not drink from it; they drink bottled water. She does not have any birds, and uses cotton in her bedding; she is not aware of any dawn or feathers in her comforter/pillows. She does not have any family history of lung disease and no personal history of lung disease. She smoked socially in her teen years. She has no occupational exposures and worked as a Haematologist in the past. She reports a history of UTI's for which she is on Mirabegron. She did receive Nitrofurantoin in September of 2023.  Ancillary information including prior medications, full medical/surgical/family/social histories, and PFTs (when available) are listed below and have been reviewed.   Review of Systems  Constitutional:   Negative for chills, diaphoresis, fever, malaise/fatigue and weight loss.  Respiratory:  Positive for shortness of breath. Negative for cough, hemoptysis and wheezing.   Cardiovascular:  Negative for chest pain and palpitations.  Musculoskeletal:  Positive for joint pain.  Skin:  Negative for itching and rash.     Objective:   Vitals:   11/22/22 1338 11/22/22 1345  BP: (!) 156/90 (!) 150/80  Pulse: 67   Temp: 97.6 F (36.4 C)   TempSrc: Temporal   SpO2: 96%   Weight: 201 lb 9.6 oz (91.4 kg)   Height: 5\' 9"  (1.753 m)    96% on RA  BMI Readings from Last 3 Encounters:  11/22/22 29.77 kg/m  11/07/22 29.24 kg/m  10/13/22 29.24 kg/m   Wt Readings from Last 3 Encounters:  11/22/22 201 lb 9.6 oz (91.4 kg)  11/07/22 198 lb (89.8 kg)  10/13/22 198 lb (89.8 kg)    Physical Exam Constitutional:      General: She is not in acute distress.    Appearance: Normal appearance. She is not ill-appearing.  HENT:     Head: Normocephalic.     Nose: Nose normal.     Mouth/Throat:     Mouth: Mucous membranes are moist.  Eyes:     Pupils: Pupils are equal, round, and reactive to light.  Cardiovascular:  Rate and Rhythm: Normal rate and regular rhythm.     Pulses: Normal pulses.     Heart sounds: Normal heart sounds.  Pulmonary:     Effort: Pulmonary effort is normal. No respiratory distress.     Breath sounds: Rales (over the basees) present. No wheezing.  Abdominal:     Palpations: Abdomen is soft.  Musculoskeletal:        General: Normal range of motion.  Skin:    General: Skin is warm.  Neurological:     General: No focal deficit present.     Mental Status: She is alert and oriented to person, place, and time. Mental status is at baseline.       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%; PR 90%; MammoSite January 2015, DECLINED ANTI-ESTROGEN TREATMENT   Mammographic microcalcification    Syncope and collapse    Wears hearing aid      Family History  Problem Relation Age of Onset   Cancer Mother 66       liver cancer   Cancer Father 25       liver cancer   Alcohol abuse Father    Heart attack Brother    Colon cancer Other    Colon polyps Other    Breast cancer Neg Hx      Past Surgical History:  Procedure Laterality Date   BREAST BIOPSY Left 2014   stereo biopsy   BREAST EXCISIONAL BIOPSY Right 2014   papilloma   BREAST LUMPECTOMY Left 2014   BREAST SURGERY Left Oct 2012   benign stereo biopsy   BREAST SURGERY Right 01-14-13   excision breast mass, intraductal papilloma   BREAST SURGERY Left 01-14-13   Excision intra-papillary carcinoma, DCIS, ER 90%, PR 90%.   cataract surgery Bilateral 2009   COLONOSCOPY  2011   Dr. Lemar Livings   dermbrasion   1965   OVARIAN CYST REMOVAL  1967   POLYPECTOMY  2006   THYROIDECTOMY  2006   TONGUE SURGERY  2009    Social History   Socioeconomic History   Marital status: Widowed    Spouse name: Not on file   Number of children: 2   Years of education: Not on file   Highest education level: Some college, no degree  Occupational History   Not on file  Tobacco Use   Smoking status: Former    Types: Cigarettes   Smokeless tobacco: Never   Tobacco comments:    quit in mid to late 20's  Vaping Use   Vaping status: Never Used  Substance and Sexual Activity   Alcohol use: No    Comment: rare- 1 drink   Drug use: No   Sexual activity: Not Currently  Other Topics Concern   Not on file  Social History Narrative   Not on file   Social  Determinants of Health   Financial Resource Strain: Low Risk  (11/07/2022)   Overall Financial Resource Strain (CARDIA)  Difficulty of Paying Living Expenses: Not very hard  Food Insecurity: No Food Insecurity (11/07/2022)   Hunger Vital Sign    Worried About Running Out of Food in the Last Year: Never true    Ran Out of Food in the Last Year: Never true  Transportation Needs: No Transportation Needs (11/07/2022)   PRAPARE - Administrator, Civil Service (Medical): No    Lack of Transportation (Non-Medical): No  Physical Activity: Inactive (11/07/2022)   Exercise Vital Sign    Days of Exercise per Week: 0 days    Minutes of Exercise per Session: 0 min  Stress: No Stress Concern Present (11/07/2022)   [employer] of Occupational Health - Occupational Stress Questionnaire    Feeling of Stress : Only a little  Social Connections: Moderately Isolated (11/07/2022)   Social Connection and Isolation Panel [NHANES]    Frequency of Communication with Friends and Family: Twice a week    Frequency of Social Gatherings with Friends and Family: Once a week    Attends Religious Services: More than 4 times per year    Active Member of [employer] or Organizations: No    Attends Banker Meetings: Never    Marital Status: Widowed  Intimate Partner Violence: Not At Risk (11/07/2022)   Humiliation, Afraid, Rape, and Kick questionnaire    Fear of Current or Ex-Partner: No    Emotionally Abused: No    Physically Abused: No    Sexually Abused: No     Allergies  Allergen Reactions   Penicillin G Rash   Penicillins Rash     CBC    Component Value Date/Time   WBC 8.8 04/15/2022 2241   RBC 4.41 04/15/2022 2241   HGB 13.6 04/15/2022 2241   HGB 14.6 03/09/2022 1624   HCT 42.2 04/15/2022 2241   HCT 43.5 03/09/2022 1624   PLT 311 04/15/2022 2241   PLT 339 03/09/2022 1624   MCV 95.7 04/15/2022 2241   MCV 93 03/09/2022 1624   MCV 93 07/07/2011 1555   MCH 30.8 04/15/2022  2241   MCHC 32.2 04/15/2022 2241   RDW 12.8 04/15/2022 2241   RDW 11.7 03/09/2022 1624   RDW 13.6 07/07/2011 1555   LYMPHSABS 2.1 03/09/2022 1624   MONOABS 0.7 11/24/2020 1919   EOSABS 0.2 03/09/2022 1624   BASOSABS 0.1 03/09/2022 1624    Pulmonary Functions Testing Results:    Latest Ref Rng & Units 04/07/2022    2:56 PM  PFT Results  FVC-Pre L 2.30   FVC-Predicted Pre % 76   Pre FEV1/FVC % % 73   FEV1-Pre L 1.68   FEV1-Predicted Pre % 74   DLCO uncorrected ml/min/mmHg 9.31   DLCO UNC% % 43   DLVA Predicted % 70   TLC L 4.31   TLC % Predicted % 75   RV % Predicted % 89     Outpatient Medications Prior to Visit  Medication Sig Dispense Refill   Calcium Carb-Cholecalciferol 600-10 MG-MCG CHEW Chew by mouth.     conjugated estrogens (PREMARIN) vaginal cream Place 1 applicator vaginally. 3 times a week     Cranberry-Vitamin C-Probiotic (AZO CRANBERRY PO) Take by mouth as needed.     levothyroxine (SYNTHROID) 112 MCG tablet Take 112 mcg by mouth daily.     lisinopril (ZESTRIL) 20 MG tablet TAKE 2 TABLETS (40 MG TOTAL) BY MOUTH DAILY. 180 tablet 0   timolol (TIMOPTIC) 0.5 % ophthalmic solution Place 1 drop into both eyes 2 (two)  times daily.     Vibegron (GEMTESA) 75 MG TABS Take 1 tablet (75 mg total) by mouth daily. 30 tablet 11   predniSONE (DELTASONE) 10 MG tablet Take 4 tablets (40 mg total) by mouth daily with breakfast for 21 days, THEN 3 tablets (30 mg total) daily with breakfast for 21 days, THEN 2 tablets (20 mg total) daily with breakfast for 21 days, THEN 1 tablet (10 mg total) daily with breakfast for 21 days. (Patient not taking: Reported on 11/22/2022) 210 tablet 0   sulfamethoxazole-trimethoprim (BACTRIM DS) 800-160 MG tablet Take 1 tablet by mouth 3 (three) times a week. (Patient not taking: Reported on 11/22/2022) 36 tablet 0   No facility-administered medications prior to visit.

## 2022-12-27 ENCOUNTER — Other Ambulatory Visit: Payer: Self-pay

## 2022-12-27 ENCOUNTER — Emergency Department
Admission: EM | Admit: 2022-12-27 | Discharge: 2022-12-27 | Discharge status: Against Medical Advice | Payer: Medicare HMO | Attending: Emergency Medicine | Admitting: Emergency Medicine

## 2022-12-27 ENCOUNTER — Encounter: Payer: Self-pay | Admitting: Emergency Medicine

## 2022-12-27 DIAGNOSIS — R112 Nausea with vomiting, unspecified: Secondary | ICD-10-CM | POA: Diagnosis not present

## 2022-12-27 DIAGNOSIS — R197 Diarrhea, unspecified: Secondary | ICD-10-CM | POA: Diagnosis not present

## 2022-12-27 DIAGNOSIS — R0902 Hypoxemia: Secondary | ICD-10-CM | POA: Diagnosis not present

## 2022-12-27 DIAGNOSIS — Z743 Need for continuous supervision: Secondary | ICD-10-CM | POA: Diagnosis not present

## 2022-12-27 DIAGNOSIS — I959 Hypotension, unspecified: Secondary | ICD-10-CM | POA: Diagnosis not present

## 2022-12-27 DIAGNOSIS — Z5321 Procedure and treatment not carried out due to patient leaving prior to being seen by health care provider: Secondary | ICD-10-CM | POA: Insufficient documentation

## 2022-12-27 DIAGNOSIS — R55 Syncope and collapse: Secondary | ICD-10-CM | POA: Diagnosis not present

## 2022-12-27 DIAGNOSIS — R110 Nausea: Secondary | ICD-10-CM | POA: Diagnosis not present

## 2022-12-27 DIAGNOSIS — R1111 Vomiting without nausea: Secondary | ICD-10-CM | POA: Diagnosis not present

## 2022-12-27 LAB — COMPREHENSIVE METABOLIC PANEL
ALT: 15 U/L (ref 0–44)
AST: 22 U/L (ref 15–41)
Albumin: 3.6 g/dL (ref 3.5–5.0)
Alkaline Phosphatase: 67 U/L (ref 38–126)
Anion gap: 8 (ref 5–15)
BUN: 25 mg/dL — ABNORMAL HIGH (ref 8–23)
CO2: 24 mmol/L (ref 22–32)
Calcium: 8.8 mg/dL — ABNORMAL LOW (ref 8.9–10.3)
Chloride: 103 mmol/L (ref 98–111)
Creatinine, Ser: 1.02 mg/dL — ABNORMAL HIGH (ref 0.44–1.00)
GFR, Estimated: 54 mL/min — ABNORMAL LOW (ref >60)
Glucose, Bld: 129 mg/dL — ABNORMAL HIGH (ref 70–99)
Potassium: 4 mmol/L (ref 3.5–5.1)
Sodium: 135 mmol/L (ref 135–145)
Total Bilirubin: 0.6 mg/dL (ref <1.2)
Total Protein: 7 g/dL (ref 6.5–8.1)

## 2022-12-27 LAB — CBC
HCT: 46 % (ref 36.0–46.0)
Hemoglobin: 15.1 g/dL — ABNORMAL HIGH (ref 12.0–15.0)
MCH: 30.9 pg (ref 26.0–34.0)
MCHC: 32.8 g/dL (ref 30.0–36.0)
MCV: 94.3 fL (ref 80.0–100.0)
Platelets: 306 10*3/uL (ref 150–400)
RBC: 4.88 MIL/uL (ref 3.87–5.11)
RDW: 13.1 % (ref 11.5–15.5)
WBC: 14.2 10*3/uL — ABNORMAL HIGH (ref 4.0–10.5)
nRBC: 0 % (ref 0.0–0.2)

## 2022-12-27 LAB — LIPASE, BLOOD: Lipase: 26 U/L (ref 11–51)

## 2022-12-27 NOTE — ED Notes (Signed)
No answer when called several times from lobby 

## 2022-12-27 NOTE — ED Triage Notes (Signed)
Arrives from home via [facility]>  Near syncope while on toilet. Chief Complaint N/V/D onset noon today.  AOx4  VS wnl.  CBG:  126

## 2022-12-27 NOTE — ED Triage Notes (Signed)
Patient to ED via [facility] from home for D/N/V. States it started around 12 after drinking orange juice that didn't agree with her.

## 2022-12-28 ENCOUNTER — Telehealth: Payer: Self-pay

## 2022-12-28 NOTE — Transitions of Care (Post Inpatient/ED Visit) (Signed)
   12/28/2022  Name: Eileen Flores MRN: [id number] DOB: 26-Dec-1935  Today's TOC FU Call Status: Today's TOC FU Call Status:: Successful TOC FU Call Completed TOC FU Call Complete Date: 12/28/22 Patient's Name and Date of Birth confirmed.  Transition Care Management Follow-up Telephone Call Date of Discharge: 12/27/22 Discharge Facility: [facility]) Type of Discharge: Emergency Department Reason for ED Visit: Other: (near syncope) How have you been since you were released from the hospital?: Better Any questions or concerns?: No  Items Reviewed: Did you receive and understand the discharge instructions provided?: No (AMA) Medications obtained,verified, and reconciled?: Yes (Medications Reviewed) Any new allergies since your discharge?: No Dietary orders reviewed?: NA Do you have support at home?: No  Medications Reviewed Today: Medications Reviewed Today     Reviewed by Karena Addison, LPN (Licensed Practical Nurse) on 12/28/22 at [phone]  Med List Status: <None>   Medication Order Taking? Sig Documenting Provider Last Dose Status Informant  Calcium Carb-Cholecalciferol 600-10 MG-MCG CHEW [id number] No Chew by mouth. [provider] Taking Active Self  conjugated estrogens (PREMARIN) vaginal cream [id number] No Place 1 applicator vaginally. 3 times a week [provider] Taking Active   Cranberry-Vitamin C-Probiotic (AZO CRANBERRY PO) [id number] No Take by mouth as needed. [provider] Taking Active   levothyroxine (SYNTHROID) 112 MCG tablet [id number] No Take 112 mcg by mouth daily. [provider] Taking Active   lisinopril (ZESTRIL) 20 MG tablet [id number] No TAKE 2 TABLETS (40 MG TOTAL) BY MOUTH DAILY. Jacky Kindle, FNP Taking Active   timolol (TIMOPTIC) 0.5 % ophthalmic solution [id number] No Place 1 drop into both eyes 2 (two) times daily. [provider] Taking Active   Vibegron (GEMTESA) 75 MG TABS  [id number] No Take 1 tablet (75 mg total) by mouth daily. Carman Ching, PA-C Taking Active             Home Care and Equipment/Supplies: Were Home Health Services Ordered?: NA Any new equipment or medical supplies ordered?: NA  Functional Questionnaire: Do you need assistance with bathing/showering or dressing?: No Do you need assistance with meal preparation?: No Do you need assistance with eating?: No Do you have difficulty maintaining continence: No Do you need assistance with getting out of bed/getting out of a chair/moving?: No Do you have difficulty managing or taking your medications?: No  Follow up appointments reviewed: PCP Follow-up appointment confirmed?: No (declined) MD Provider Line [phone] Given: No Specialist Hospital Follow-up appointment confirmed?: NA Do you need transportation to your follow-up appointment?: No Do you understand care options if your condition(s) worsen?: Yes-patient verbalized understanding    SIGNATURE Karena Addison, LPN [facility] Nurse Health Advisor Direct Dial [phone]

## 2023-01-09 ENCOUNTER — Ambulatory Visit: Payer: Medicare HMO | Admitting: Cardiology

## 2023-01-09 DIAGNOSIS — N281 Cyst of kidney, acquired: Secondary | ICD-10-CM | POA: Diagnosis not present

## 2023-01-09 DIAGNOSIS — I10 Essential (primary) hypertension: Secondary | ICD-10-CM | POA: Diagnosis not present

## 2023-01-09 DIAGNOSIS — N2889 Other specified disorders of kidney and ureter: Secondary | ICD-10-CM | POA: Diagnosis not present

## 2023-01-09 DIAGNOSIS — K219 Gastro-esophageal reflux disease without esophagitis: Secondary | ICD-10-CM | POA: Diagnosis not present

## 2023-01-09 DIAGNOSIS — R82998 Other abnormal findings in urine: Secondary | ICD-10-CM | POA: Diagnosis not present

## 2023-01-12 ENCOUNTER — Ambulatory Visit: Payer: Self-pay | Admitting: *Deleted

## 2023-01-12 NOTE — Telephone Encounter (Signed)
  Chief Complaint: Constipation Symptoms: Pt reports constipation, unsure how long but >3 days. States stool is at rectum. Some abdominal cramping. States has taken Senna x 3 nights.  Frequency: > 3 days Pertinent Negatives: Patient denies nausea, vomiting, rectal pain Disposition: [] ED /[] Urgent Care (no appt availability in office) / [x] Appointment(In office/virtual)/ []  [facility]/ [] Home Care/ [] Refused Recommended Disposition /[] [facility]/ []  Follow-up with PCP Additional Notes:   Provided home care advise per protocol to try first, pt stated not comfortable, anxious to proceed with advise. Appt secured for tomorrow, first available.  Pt then stated she would try the suppository recommended. Advised if she had BM, keep appt as this has been a chronic issue. Advised pt DO NOT STRAIN. Pt verbalizes understanding. Reason for Disposition  [1] Rectal pain or fullness from fecal impaction (rectum full of stool) AND [2] has not tried SITZ bath, suppository or enema  Answer Assessment - Initial Assessment Questions 1. STOOL PATTERN OR FREQUENCY: "How often do you have a bowel movement (BM)?"  (Normal range: 3 times a day to every 3 days)  "When was your last BM?"       LBM  >3 days "Not sure"   Every morning initially, smaller now 2. STRAINING: "Do you have to strain to have a BM?"      Yes 3. RECTAL PAIN: "Does your rectum hurt when the stool comes out?" If Yes, ask: "Do you have hemorrhoids? How bad is the pain?"  (Scale 1-10; or mild, moderate, severe)     No 4. STOOL COMPOSITION: "Are the stools hard?"      No 5. BLOOD ON STOOLS: "Has there been any blood on the toilet tissue or on the surface of the BM?" If Yes, ask: "When was the last time?"     no 6. CHRONIC CONSTIPATION: "Is this a new problem for you?"  If No, ask: "How long have you had this problem?" (days, weeks, months)      Years 7. CHANGES IN DIET OR HYDRATION: "Have there been any recent changes in your  diet?" "How much fluids are you drinking on a daily basis?"  "How much have you had to drink today?"     Trying to drink water 8. MEDICINES: "Have you been taking any new medicines?" "Are you taking any narcotic pain medicines?" (e.g., Dilaudid, morphine, Percocet, Vicodin)     no 9. LAXATIVES: "Have you been using any stool softeners, laxatives, or enemas?"  If Yes, ask "What, how often, and when was the last time?"     Senna at HS 10. ACTIVITY:  "How much walking do you do every day?"  "Has your activity level decreased in the past week?"        No 11. CAUSE: "What do you think is causing the constipation?"        Chronic 12. OTHER SYMPTOMS: "Do you have any other symptoms?" (e.g., abdomen pain, bloating, fever, vomiting)       Cramping all over 13. MEDICAL HISTORY: "Do you have a history of hemorrhoids, rectal fissures, or rectal surgery or rectal abscess?"         No  Protocols used: Constipation-A-AH

## 2023-01-13 ENCOUNTER — Encounter: Payer: Self-pay | Admitting: Family Medicine

## 2023-01-13 ENCOUNTER — Ambulatory Visit (INDEPENDENT_AMBULATORY_CARE_PROVIDER_SITE_OTHER): Payer: Medicare HMO | Admitting: Family Medicine

## 2023-01-13 VITALS — BP 126/86 | HR 79 | Temp 97.6°F | Ht 69.0 in | Wt 197.0 lb

## 2023-01-13 DIAGNOSIS — Z23 Encounter for immunization: Secondary | ICD-10-CM | POA: Diagnosis not present

## 2023-01-13 DIAGNOSIS — K5900 Constipation, unspecified: Secondary | ICD-10-CM | POA: Insufficient documentation

## 2023-01-13 NOTE — Assessment & Plan Note (Addendum)
Pt presents with 6 days of constipation concerns. Management so far has been one tablet of senna the past three nights and two suppositories last night, 01/12/23. Given the time frame and original management, pt and provider discussed the following plan:  Increase Water intake to 80 ounces per day Take stool softener colace or laxative senna twice daily for the next week. After that, just take it once daily. Taper if diarrhea starts. Also add Miralax, one cap full, twice daily, once in AM and once in PM for next several days - stop if diarrhea starts. You may do this up to three times per day. Prune Juice daily If you still haven't gone to the restroom after three days, it's time to try either a suppository or an enema. This should make you go. Continue to take the stool softener and Miralax, even if you use the suppository.  Your goal is to have a soft bowel movement once daily. Once you start going to the bathroom, cut back to once or twice daily Miralax/prune prune juice until you achieve that goal.    Increase walking daily.  Abdominal massages as discussed. Discussed laxative Milk of Magensia can cause increase abdominal cramping, when compared to Miralax.   Educated on emergent symptoms of worsening constipation and when to seek further care. Worsening abdominal pain, vomiting, rigid abdomen, dizziness, worsening distention, fevers, palpitations, continued constipation after plan discussed.

## 2023-01-13 NOTE — Progress Notes (Signed)
Acute Office Visit  Introduced to nurse practitioner role and practice setting.  All questions answered.  Discussed provider/patient relationship and expectations.   Subjective:     Patient ID: Eileen Flores, female    DOB: [birth year], 87 y.o.   MRN: [id number]  Chief Complaint  Patient presents with   Constipation    Patient has not had BM in 3 or 4 days.  Patient started taking 2 Senna every night since Monday.  She did not take any last night because she felt so bad.  Last night she tried suppositories with no relief.  She is becoming nauseated and having crampy abdominal pain.  She denies any rectal bleeding.   Pt presents with constipation since 01/08/23. States last bowel movement was on 01/07/23.She noticed she was having trouble with having a bowel movement (BM) on Tuesday 01/10/23, and since then she still have not been able to have a BM. States the last three nights she has had one stool softener per night, senna tablet, and yesterday she tried two suppositories - which she felt just came out right away. She says she is normally regular having a BM every 1-2 days. Says she has some mild nausea and abdominal cramping, but denies abdominal pain, vomiting, diarrhea, bloody stool, or melena. She states she drinks about 40-60z of water or fluid a day, and today she hasn't eaten that much because of how constipated she feels.   Denies fever, chills, rigors, palpitations, shortness of breathing, difficulty breathing, dizziness, or weakness from baseline.  She is concerned the constipation is worse, and concerned she has yet to have BM from the senna and suppositories.    Review of Systems  Constitutional:  Negative for chills, diaphoresis, fever, malaise/fatigue and weight loss.  Gastrointestinal:  Positive for nausea. Negative for abdominal pain and vomiting.  Skin:  Negative for rash.  All other systems reviewed and are negative.       Objective:    BP 126/86 (BP Location: Right  Arm, Patient Position: Sitting, Cuff Size: Normal)   Pulse 79   Temp 97.6 F (36.4 C) (Oral)   Ht 5\' 9"  (1.753 m)   Wt 197 lb (89.4 kg)   SpO2 96%   BMI 29.09 kg/m    Physical Exam Constitutional:      Appearance: Normal appearance.  HENT:     Head: Normocephalic.  Cardiovascular:     Rate and Rhythm: Normal rate and regular rhythm.     Pulses: Normal pulses.  Pulmonary:     Effort: Pulmonary effort is normal.     Breath sounds: Normal breath sounds.  Abdominal:     General: Abdomen is protuberant. Bowel sounds are normal. There is no distension or abdominal bruit. There are no signs of injury.     Palpations: Abdomen is soft. There is no shifting dullness, fluid wave, hepatomegaly, splenomegaly, mass or pulsatile mass.     Tenderness: There is no guarding or rebound.     Hernia: No hernia is present.     Comments: Mild pressure tenderness to deep palpitation of RLQ  Skin:    General: Skin is warm and dry.     Capillary Refill: Capillary refill takes less than 2 seconds.  Neurological:     General: No focal deficit present.     Mental Status: She is alert and oriented to person, place, and time. Mental status is at baseline.  Psychiatric:        Mood and Affect: Mood normal.  Behavior: Behavior normal.        Thought Content: Thought content normal.        Judgment: Judgment normal.     No results found for any visits on 01/13/23.      Assessment & Plan:   Problem List Items Addressed This Visit       Other   Acute constipation - Primary    Pt presents with 6 days of constipation concerns. Management so far has been one tablet of senna the past three nights and two suppositories last night, 01/12/23. Given the time frame and original management, pt and provider discussed the following plan:   Increase Water intake to 80 ounces per day Take stool softener colace or laxative senna twice daily for the next week. After that, just take it once daily. Taper if  diarrhea starts. Also add Miralax, one cap full, twice daily, once in AM and once in PM for next several days - stop if diarrhea starts. You may do this up to three times per day. Prune Juice daily If you still haven't gone to the restroom after three days, it's time to try either a suppository or an enema. This should make you go. Continue to take the stool softener and Miralax, even if you use the suppository.  Your goal is to have a soft bowel movement once daily. Once you start going to the bathroom, cut back to once or twice daily Miralax/prune prune juice until you achieve that goal.    Increase walking daily.  Abdominal massages as discussed. Discussed stimulant laxative like Milk of Magensia can cause increase abdominal cramping.      Other Visit Diagnoses     Need for influenza vaccination       Relevant Orders   Flu Vaccine Trivalent High Dose (Fluad) (Completed)      Please continue to take your daily maintenance medications as prescribed for your chronic disease management as discussed during our visit.   No orders of the defined types were placed in this encounter.  Return if symptoms worsen or fail to improve.  I, Sallee Provencal, FNP, have reviewed all documentation for this visit. The documentation on 01/13/23 for the exam, diagnosis, procedures, and orders are all accurate and complete.   Sallee Provencal, FNP

## 2023-01-13 NOTE — Patient Instructions (Addendum)
  Increase Water intake to 80 ounces per day Take stool softener colace or laxative senna twice daily for the next week. After that, just take it once daily. Taper if diarrhea starts. Also add Miralax, one cap full, twice daily, once in AM and once in PM for next several days - stop if diarrhea starts. You may do this up to three times per day. Prune Juice daily If you still haven't gone to the restroom after three days, it's time to try either a suppository or an enema. This should make you go. Continue to take the stool softener and Miralax, even if you use the suppository.  Your goal is to have a soft bowel movement once daily. Once you start going to the bathroom, cut back to once or twice daily Miralax/prune prune juice until you achieve that goal.    Increase walking daily.  Abdominal massages as discussed. Discussed laxative Milk of Magensia can cause increase abdominal cramping, when compared to Miralax.   Educated on emergent symptoms of worsening constipation and when to seek further care. Worsening abdominal pain, vomiting, rigid abdomen, dizziness, worsening distention, fevers, palpitations, continued constipation after plan discussed.  Drue Harr A. Ephriam Knuckles, FNP  [facility] [address] [place], [place] [address] [phone] (phone) [phone] (fax) [facility]     This is a list of the screening recommended for you and due dates:  Health Maintenance  Topic Date Due   DTaP/Tdap/Td vaccine (1 - Tdap) Never done   Zoster (Shingles) Vaccine (1 of 2) Never done   Flu Shot  05/08/2023*   Medicare Annual Wellness Visit  11/07/2023   Pneumonia Vaccine  Completed   HPV Vaccine  Aged Out   DEXA scan (bone density measurement)  Discontinued   COVID-19 Vaccine  Discontinued  *Topic was postponed. The date shown is not the original due date.

## 2023-01-17 ENCOUNTER — Other Ambulatory Visit: Payer: Self-pay | Admitting: Nephrology

## 2023-01-17 DIAGNOSIS — I10 Essential (primary) hypertension: Secondary | ICD-10-CM | POA: Diagnosis not present

## 2023-01-17 DIAGNOSIS — N3941 Urge incontinence: Secondary | ICD-10-CM | POA: Diagnosis not present

## 2023-01-17 DIAGNOSIS — N2889 Other specified disorders of kidney and ureter: Secondary | ICD-10-CM

## 2023-01-17 DIAGNOSIS — K219 Gastro-esophageal reflux disease without esophagitis: Secondary | ICD-10-CM | POA: Diagnosis not present

## 2023-01-17 DIAGNOSIS — R82998 Other abnormal findings in urine: Secondary | ICD-10-CM | POA: Diagnosis not present

## 2023-01-17 DIAGNOSIS — N281 Cyst of kidney, acquired: Secondary | ICD-10-CM

## 2023-01-17 DIAGNOSIS — J849 Interstitial pulmonary disease, unspecified: Secondary | ICD-10-CM | POA: Diagnosis not present

## 2023-01-18 ENCOUNTER — Ambulatory Visit: Payer: Medicare HMO | Admitting: Student in an Organized Health Care Education/Training Program

## 2023-01-20 ENCOUNTER — Ambulatory Visit
Admission: RE | Admit: 2023-01-20 | Discharge: 2023-01-20 | Disposition: A | Discharge status: Home / Self Care | Payer: Medicare HMO | Source: Ambulatory Visit | Attending: Nephrology | Admitting: Nephrology

## 2023-01-20 DIAGNOSIS — N2889 Other specified disorders of kidney and ureter: Secondary | ICD-10-CM | POA: Diagnosis not present

## 2023-01-20 DIAGNOSIS — I10 Essential (primary) hypertension: Secondary | ICD-10-CM | POA: Diagnosis not present

## 2023-01-20 DIAGNOSIS — N281 Cyst of kidney, acquired: Secondary | ICD-10-CM | POA: Insufficient documentation

## 2023-02-09 ENCOUNTER — Telehealth: Payer: Self-pay | Admitting: *Deleted

## 2023-02-09 DIAGNOSIS — N3941 Urge incontinence: Secondary | ICD-10-CM

## 2023-02-09 MED ORDER — MIRABEGRON ER 50 MG PO TB24: 50.0000 mg | ORAL_TABLET | Freq: Every day | ORAL | 11 refills | Status: DC

## 2023-02-09 NOTE — Telephone Encounter (Signed)
 I just sent Myrbetriq 50mg  to the [facility] in [place] as an alternative. Please schedule office visit for symptom recheck and PVR in 6 weeks.

## 2023-02-09 NOTE — Telephone Encounter (Signed)
 LVM for pt to return call

## 2023-02-09 NOTE — Telephone Encounter (Signed)
 Patient called in and states the gemtesa is  to much money and she would like to try something else.

## 2023-02-09 NOTE — Addendum Note (Signed)
 Addended by: Debarah Crape on: 02/09/2023 01:31 PM   Modules accepted: Orders

## 2023-02-09 NOTE — Telephone Encounter (Signed)
 Notified patient as instructed, patient scheduled for follow up

## 2023-02-14 DIAGNOSIS — H401131 Primary open-angle glaucoma, bilateral, mild stage: Secondary | ICD-10-CM | POA: Diagnosis not present

## 2023-02-27 ENCOUNTER — Ambulatory Visit: Payer: Medicare HMO | Admitting: Cardiology

## 2023-03-23 ENCOUNTER — Ambulatory Visit: Payer: Medicare HMO | Admitting: Physician Assistant

## 2023-03-23 VITALS — BP 181/78 | HR 72 | Ht 69.0 in | Wt 198.0 lb

## 2023-03-23 DIAGNOSIS — N3941 Urge incontinence: Secondary | ICD-10-CM | POA: Diagnosis not present

## 2023-03-23 DIAGNOSIS — N3281 Overactive bladder: Secondary | ICD-10-CM

## 2023-03-23 LAB — BLADDER SCAN AMB NON-IMAGING: Scan Result: 0

## 2023-03-23 NOTE — Progress Notes (Signed)
03/23/2023 11:53 AM   Eileen Flores January 13, 1936 [id number]  CC: Chief Complaint  Patient presents with   Recurrent UTI   HPI: Eileen Flores is a 88 y.o. female with PMH of OAB wet previously on Myrbetriq 25mg  who failed Gemtesa due to cost who presents today for symptom recheck on Myrbetriq 50 mg.   Today she reports comfortable improvement on Myrbetriq 50 mg compared to [place], however unfortunately the Myrbetriq remains rather expensive.  PVR 0 mL.  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   cataract surgery Bilateral 2009   COLONOSCOPY  2011   Dr. Lemar Livings   dermbrasion   1965   OVARIAN CYST REMOVAL  1967   POLYPECTOMY  2006   THYROIDECTOMY  2006   TONGUE SURGERY  2009    Home Medications:  Allergies as of 03/23/2023       Reactions   Penicillin G Rash   Penicillins Rash        Medication List        Accurate as of March 23, 2023 11:53 AM. If you have any questions, ask your nurse  or doctor.          AZO CRANBERRY PO Take by mouth as needed.   Calcium Carb-Cholecalciferol 600-10 MG-MCG Chew Chew by mouth.   conjugated estrogens vaginal cream Commonly known as: PREMARIN Place 1 applicator vaginally. 3 times a week   levothyroxine 112 MCG tablet Commonly known as: SYNTHROID Take 112 mcg by mouth daily.   lisinopril 20 MG tablet Commonly known as: ZESTRIL TAKE 2 TABLETS (40 MG TOTAL) BY MOUTH DAILY.   mirabegron ER 50 MG Tb24 tablet Commonly known as: MYRBETRIQ Take 1 tablet (50 mg total) by mouth daily.   timolol 0.5 % ophthalmic solution Commonly known as: TIMOPTIC Place 1 drop into both eyes 2 (two) times daily.        Allergies:  Allergies  Allergen Reactions   Penicillin G Rash   Penicillins Rash    Family History: Family History  Problem Relation Age of Onset   Cancer Mother 28       liver cancer   Cancer Father 5       liver cancer   Alcohol abuse Father    Heart attack Brother    Colon cancer Other    Colon polyps Other    Breast cancer Neg Hx  Social History:   reports that she has quit smoking. Her smoking use included cigarettes. She has never used smokeless tobacco. She reports that she does not drink alcohol and does not use drugs.  Physical Exam: BP (!) 181/78   Pulse 72   Ht 5\' 9"  (1.753 m)   Wt 198 lb (89.8 kg)   BMI 29.24 kg/m   Constitutional:  Alert and oriented, no acute distress, nontoxic appearing HEENT: [place], AT Cardiovascular: No clubbing, cyanosis, or edema Respiratory: Normal respiratory effort, no increased work of breathing Skin: No rashes, bruises or suspicious lesions Neurologic: Grossly intact, no focal deficits, moving all 4 extremities Psychiatric: Normal mood and affect  Laboratory Data: Results for orders placed or performed in visit on 03/23/23  BLADDER SCAN AMB NON-IMAGING   Collection Time: 03/23/23 11:33 AM  Result Value Ref Range   Scan Result 0 ml    Assessment & Plan:    1. Urge incontinence of urine (Primary) Symptoms decently well-controlled on Myrbetriq 50 mg, however cost remains a concern.  We discussed third line therapies as an alternative and she is most interested in PTNS.  I gave her a pamphlet today.  She wants to continue Myrbetriq for now, but may consider switching to PTNS.  She can call us if she desires to pursue this.  Otherwise, we will see her in a year. - BLADDER SCAN AMB NON-IMAGING   Return in about 1 year (around 03/22/2024) for Annual OAB f/u with PVR, call if PTNS desired sooner.  Carman Ching, PA-C  [facility]  [address], [address] [place], [place] [address] [phone]

## 2023-03-24 DIAGNOSIS — H353131 Nonexudative age-related macular degeneration, bilateral, early dry stage: Secondary | ICD-10-CM | POA: Diagnosis not present

## 2023-03-31 ENCOUNTER — Ambulatory Visit
Admission: RE | Admit: 2023-03-31 | Discharge: 2023-03-31 | Disposition: A | Discharge status: Home / Self Care | Payer: Medicare HMO | Source: Ambulatory Visit | Attending: Student in an Organized Health Care Education/Training Program | Admitting: Student in an Organized Health Care Education/Training Program

## 2023-03-31 DIAGNOSIS — J849 Interstitial pulmonary disease, unspecified: Secondary | ICD-10-CM | POA: Diagnosis not present

## 2023-03-31 DIAGNOSIS — R918 Other nonspecific abnormal finding of lung field: Secondary | ICD-10-CM | POA: Diagnosis not present

## 2023-03-31 DIAGNOSIS — K449 Diaphragmatic hernia without obstruction or gangrene: Secondary | ICD-10-CM | POA: Diagnosis not present

## 2023-03-31 DIAGNOSIS — I700 Atherosclerosis of aorta: Secondary | ICD-10-CM | POA: Diagnosis not present

## 2023-04-07 ENCOUNTER — Encounter: Payer: Self-pay | Admitting: Cardiology

## 2023-04-07 ENCOUNTER — Ambulatory Visit: Payer: Medicare HMO | Attending: Cardiology | Admitting: Cardiology

## 2023-04-07 VITALS — BP 138/80 | HR 86 | Ht 69.0 in | Wt 195.4 lb

## 2023-04-07 DIAGNOSIS — R55 Syncope and collapse: Secondary | ICD-10-CM

## 2023-04-07 DIAGNOSIS — I10 Essential (primary) hypertension: Secondary | ICD-10-CM | POA: Diagnosis not present

## 2023-04-07 DIAGNOSIS — I471 Supraventricular tachycardia, unspecified: Secondary | ICD-10-CM

## 2023-04-07 MED ORDER — LISINOPRIL 20 MG PO TABS: 20.0000 mg | ORAL_TABLET | Freq: Every day | ORAL | Status: DC

## 2023-04-07 NOTE — Progress Notes (Signed)
 Cardiology Office Note:    Date:  04/07/2023   ID:  Eileen Flores, DOB 10-Jan-1936, MRN [id number]  PCP:  Jacky Kindle, FNP   [facility] Providers Cardiologist:  Debbe Odea, MD     Referring MD: Jacky Kindle, FNP   Chief Complaint  Patient presents with   6 month follow up     Patient c/o shortness of breath with little exertion.     History of Present Illness:    Eileen Flores is a 88 y.o. female with a hx of hypothyroidism, ILD who presents for follow-up.  Previously seen due to syncope.  Workup with echo and cardiac monitor was unrevealing.  Diagnosed with ILD/atypical pneumonitis.  Being seen by pulmonary medicine, has occasional shortness of breath.  Otherwise doing okay, takes lisinopril 20 mg daily.  Higher doses of lisinopril makes her dizzy.   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   cataract surgery Bilateral 2009   COLONOSCOPY  2011   Dr. Lemar Livings   dermbrasion   1965   OVARIAN CYST REMOVAL   1967   POLYPECTOMY  2006   THYROIDECTOMY  2006   TONGUE SURGERY  2009    Current Medications: Current Meds  Medication Sig   Calcium Carb-Cholecalciferol 600-10 MG-MCG CHEW Chew by mouth.   conjugated estrogens (PREMARIN) vaginal cream Place 1 applicator vaginally. 3 times a week   Cranberry-Vitamin C-Probiotic (AZO CRANBERRY PO) Take by mouth as needed.   levothyroxine (SYNTHROID) 112 MCG tablet Take 112 mcg by mouth daily.   mirabegron ER (MYRBETRIQ) 50 MG TB24 tablet Take 1 tablet (50 mg total) by mouth daily.   timolol (TIMOPTIC) 0.5 % ophthalmic solution Place 1 drop into both eyes 2 (two) times daily.   [DISCONTINUED] lisinopril (ZESTRIL) 20 MG tablet TAKE 2 TABLETS (40 MG TOTAL) BY MOUTH DAILY. (Patient taking differently: Take 20 mg by mouth daily.)     Allergies:   Penicillin g and Penicillins   Social History   Socioeconomic History   Marital status: Widowed    Spouse name: Not on file   Number of children: 2   Years of education: Not on file   Highest education level: Some college, no degree  Occupational History   Not on file  Tobacco Use   Smoking status: Former    Types: Cigarettes   Smokeless tobacco: Never   Tobacco comments:  quit in mid to late 20's  Vaping Use   Vaping status: Never Used  Substance and Sexual Activity   Alcohol use: No    Comment: rare- 1 drink   Drug use: No   Sexual activity: Not Currently  Other Topics Concern   Not on file  Social History Narrative   Not on file   Social Drivers of Health   Financial Resource Strain: Low Risk  (11/07/2022)   Overall Financial Resource Strain (CARDIA)    Difficulty of Paying Living Expenses: Not very hard  Food Insecurity: No Food Insecurity (11/07/2022)   Hunger Vital Sign    Worried About Running Out of Food in the Last Year: Never true    Ran Out of Food in the Last Year: Never true  Transportation Needs: No Transportation Needs (11/07/2022)   PRAPARE - Scientist, research (physical sciences) (Medical): No    Lack of Transportation (Non-Medical): No  Physical Activity: Inactive (11/07/2022)   Exercise Vital Sign    Days of Exercise per Week: 0 days    Minutes of Exercise per Session: 0 min  Stress: No Stress Concern Present (11/07/2022)   [employer] of Occupational Health - Occupational Stress Questionnaire    Feeling of Stress : Only a little  Social Connections: Moderately Isolated (11/07/2022)   Social Connection and Isolation Panel [NHANES]    Frequency of Communication with Friends and Family: Twice a week    Frequency of Social Gatherings with Friends and Family: Once a week    Attends Religious Services: More than 4 times per year    Active Member of [employer] or Organizations: No    Attends Banker Meetings: Never    Marital Status: Widowed     Family History: The patient's family history includes Alcohol abuse in her father; Cancer (age of onset: 68) in her father; Cancer (age of onset: 16) in her mother; Colon cancer in an other family member; Colon polyps in an other family member; Heart attack in her brother. There is no history of Breast cancer.  ROS:   Please see the history of present illness.     All other systems reviewed and are negative.  EKGs/Labs/Other Studies Reviewed:    The following studies were reviewed today:           Recent Labs: 12/27/2022: ALT 15; BUN 25; Creatinine, Ser 1.02; Hemoglobin 15.1; Platelets 306; Potassium 4.0; Sodium 135  Recent Lipid Panel    Component Value Date/Time   CHOL 205 (H) 12/21/2020 1513   TRIG 130 12/21/2020 1513   HDL 52 12/21/2020 1513   CHOLHDL 3.9 12/21/2020 1513   LDLCALC 130 (H) 12/21/2020 1513     Risk Assessment/Calculations:         Physical Exam:    VS:  BP 138/80 (BP Location: Left Arm, Patient Position: Sitting, Cuff Size: Large)   Pulse 86   Ht 5\' 9"  (1.753 m)   Wt 195 lb 6 oz (88.6 kg)   SpO2 92%   BMI 28.85 kg/m     Wt Readings from Last 3  Encounters:  04/07/23 195 lb 6 oz (88.6 kg)  03/23/23 198 lb (89.8 kg)  01/13/23 197 lb (89.4 kg)     GEN:  Well nourished, well developed in no acute distress HEENT: Normal NECK: No JVD; No carotid bruits CARDIAC: RRR, no murmurs, rubs, gallops RESPIRATORY: No wheezing or rhonchi. ABDOMEN: Soft, non-tender, non-distended MUSCULOSKELETAL:  No edema; No deformity  SKIN:  Warm and dry NEUROLOGIC:  Alert and oriented x 3 PSYCHIATRIC:  Normal affect   ASSESSMENT:    1. Syncope and collapse   2. PSVT (paroxysmal supraventricular tachycardia) (HCC)   3. Primary hypertension    PLAN:    In order of problems listed above:  Syncope, echo 06/2022.  EF 55 to 60%.  Cardiac monitor 01/2023 no significant sustained arrhythmias, paroxysmal SVT.  Appears vasovagal with presyncopal and syncopal episode occurring in the context of a bowel movement Hypertension, BP controlled.  Continue lisinopril 20 mg daily.  Follow-up yearly or as needed.       Medication Adjustments/Labs and Tests Ordered: Current medicines are reviewed at length with the patient today.  Concerns regarding medicines are outlined above.  Orders Placed This Encounter  Procedures   EKG 12-Lead   Meds ordered this encounter  Medications   lisinopril (ZESTRIL) 20 MG tablet    Sig: Take 1 tablet (20 mg total) by mouth daily.    Patient note: Please call office for appointment before next refill is due    Patient Instructions  Medication Instructions:   Your Physician recommend you continue on your current medication as directed.    *If you need a refill on your cardiac medications before your next appointment, please call your pharmacy*   Lab Work: None ordered If you have labs (blood work) drawn today and your tests are completely normal, you will receive your results only by: MyChart Message (if you have MyChart) OR A paper copy in the mail If you have any lab test that is abnormal or we need to change your  treatment, we will call you to review the results.   Testing/Procedures: None ordered   Follow-Up: At [facility], you and your health needs are our priority.  As part of our continuing mission to provide you with exceptional heart care, we have created designated Provider Care Teams.  These Care Teams include your primary Cardiologist (physician) and Advanced Practice Providers (APPs -  Physician Assistants and Nurse Practitioners) who all work together to provide you with the care you need, when you need it.  We recommend signing up for the patient portal called "MyChart".  Sign up information is provided on this After Visit Summary.  MyChart is used to connect with patients for Virtual Visits (Telemedicine).  Patients are able to view lab/test results, encounter notes, upcoming appointments, etc.  Non-urgent messages can be sent to your provider as well.   To learn more about what you can do with MyChart, go to [email or web address].    Your next appointment:   1 year(s)  Provider:   You may see Debbe Odea, MD or one of the following Advanced Practice Providers on your designated Care Team:   Nicolasa Ducking, NP Eula Listen, PA-C Cadence Fransico Michael, PA-C Charlsie Quest, NP Carlos Levering, NP           Signed, Debbe Odea, MD  04/07/2023 3:04 PM    [facility]

## 2023-04-07 NOTE — Patient Instructions (Signed)
 Medication Instructions:  Your Physician recommend you continue on your current medication as directed.     *If you need a refill on your cardiac medications before your next appointment, please call your pharmacy*   Lab Work: None ordered. If you have labs (blood work) drawn today and your tests are completely normal, you will receive your results only by: MyChart Message (if you have MyChart) OR A paper copy in the mail If you have any lab test that is abnormal or we need to change your treatment, we will call you to review the results.   Testing/Procedures: None ordered.    Follow-Up: At [facility], you and your health needs are our priority.  As part of our continuing mission to provide you with exceptional heart care, we have created designated Provider Care Teams.  These Care Teams include your primary Cardiologist (physician) and Advanced Practice Providers (APPs -  Physician Assistants and Nurse Practitioners) who all work together to provide you with the care you need, when you need it.  We recommend signing up for the patient portal called "MyChart".  Sign up information is provided on this After Visit Summary.  MyChart is used to connect with patients for Virtual Visits (Telemedicine).  Patients are able to view lab/test results, encounter notes, upcoming appointments, etc.  Non-urgent messages can be sent to your provider as well.   To learn more about what you can do with MyChart, go to [email or web address].    Your next appointment:   1 year(s)  Provider:   You may see Debbe Odea, MD or one of the following Advanced Practice Providers on your designated Care Team:   Nicolasa Ducking, NP Eula Listen, PA-C Cadence Fransico Michael, PA-C Charlsie Quest, NP Carlos Levering, NP

## 2023-04-17 ENCOUNTER — Telehealth: Payer: Self-pay | Admitting: Student in an Organized Health Care Education/Training Program

## 2023-04-17 NOTE — Telephone Encounter (Signed)
 Tiffany calling with call report. For CT scan.Tiffany phone number is [phone].

## 2023-04-17 NOTE — Telephone Encounter (Signed)
 Dr. Aundria Rud was notified through secure chat. He is aware of the results.  Nothing further needed.

## 2023-04-26 ENCOUNTER — Encounter: Payer: Self-pay | Admitting: Student in an Organized Health Care Education/Training Program

## 2023-04-26 ENCOUNTER — Ambulatory Visit: Admitting: Student in an Organized Health Care Education/Training Program

## 2023-04-26 VITALS — BP 146/78 | HR 68 | Temp 98.0°F | Ht 69.0 in | Wt 195.0 lb

## 2023-04-26 DIAGNOSIS — J849 Interstitial pulmonary disease, unspecified: Secondary | ICD-10-CM | POA: Diagnosis not present

## 2023-04-26 DIAGNOSIS — Z87891 Personal history of nicotine dependence: Secondary | ICD-10-CM

## 2023-04-26 DIAGNOSIS — R911 Solitary pulmonary nodule: Secondary | ICD-10-CM | POA: Diagnosis not present

## 2023-04-26 NOTE — Progress Notes (Signed)
 Assessment & Plan:   #ILD #Pulmonary nodules  Patient is presenting for follow up with imaging consistent with ILD. Discussed during ILD board and the consensus is that this is likely hypersensitivity pneumonitis, pending filling of the ILD questionnaire (which she has not done).    The ILD board recommendation favors hypersensitivity pneumonitis. Workup has included auto-immune and CTD testing (negative ANA, negative myositis workup, negative vasculitis workup), a high resolution chest CT, esophagogram (no aspiration/reflux), and a negative hypersensitivity pneumonitis panel.   Given all negative workup, and the imaging suggestive of HSP, I did discuss with the patient recommendation for bronchoscopy and transbronchial biopsy during our previous visits, as well as today. Explained risks and benefits, which the patient is keenly aware of. She is very familiar with the diagnosis of hypersensitivity pneumonitis and understands the consequences it could have on her lung function. She remains hesitant to undergo bronchoscopy or initiate prednisone therapy.   Presents today with repeat imaging that is again consistent with HSP. The CT is showing RLL consolidative opacities that might represent nodules, but in my opinion are most likely to represent changes secondary to HSP. Explained this to the patient, and she remains hesitant and would like time to think. Will hold off on further imaging or testing at this point, and re-visit this in 6 months should the patient desire further imaging or intervention.   Return in about 6 months (around 10/27/2023).  I spent 30 minutes caring for this patient today, including preparing to see the patient, obtaining a medical history , reviewing a separately obtained history, performing a medically appropriate examination and/or evaluation, counseling and educating the patient/family/caregiver, ordering medications, tests, or procedures, documenting clinical  information in the electronic health record, and independently interpreting results (not separately reported/billed) and communicating results to the patient/family/caregiver  [name], MD [facility] 04/26/2023 10:57 AM    End of visit medications:  No orders of the defined types were placed in this encounter.    Current Outpatient Medications:    Calcium Carb-Cholecalciferol 600-10 MG-MCG CHEW, Chew by mouth., Disp: , Rfl:    conjugated estrogens (PREMARIN) vaginal cream, Place 1 applicator vaginally. 3 times a week, Disp: , Rfl:    Cranberry-Vitamin C-Probiotic (AZO CRANBERRY PO), Take by mouth as needed., Disp: , Rfl:    levothyroxine (SYNTHROID) 112 MCG tablet, Take 112 mcg by mouth daily., Disp: , Rfl:    lisinopril (ZESTRIL) 20 MG tablet, Take 1 tablet (20 mg total) by mouth daily., Disp: , Rfl:    mirabegron ER (MYRBETRIQ) 50 MG TB24 tablet, Take 1 tablet (50 mg total) by mouth daily., Disp: 30 tablet, Rfl: 11   timolol (TIMOPTIC) 0.5 % ophthalmic solution, Place 1 drop into both eyes 2 (two) times daily., Disp: , Rfl:    Subjective:   PATIENT ID: Eileen Flores GENDER: female DOB: [birth year], MRN: [id number]  Chief Complaint  Patient presents with   Follow-up    Shortness of breath on exertion.     HPI  The patient is a pleasant 88 year old female presenting to clinic for follow up.  Patient is presenting following repeat chest CT that is again showing findings consistent with hypersensitivity pneumonitis. The CT is also showing possible nodules in the RLL.  She continues to have the same symptoms as prior, with increased shortness of breath, especially with exertion. No new cough is reported. No fevers, chills, night sweats, weight loss, or arthralgias.  I've seen Eileen Flores multiple times in  the past year to discuss findings on her HRCT. We had discussed bronchoscopy and biopsy to establish a diagnosis but she deferred. She was also prescribed  prednisone and bactrim during a prior visit but she was hesitant to take them and did not.     Her symptoms mostly revolved around exertional dyspnea. She is not very active and uses a walker to ambulate. She reports joint pains that are mostly in her knees, something she's dealt with for a long period of time. She does not report difficulty swallowing but does report symptoms of reflux. No rashes reported, and no light sensitivity. No ulcers reported, and no easy bruising.   She was found to have radiographic pulmonary abnormalities on an abdominal CT in 2022, followed by a dedicated chest CT in March of 2023. Following our initial visit, we did discuss her images and history at the multi-disciplinary ILD board, and the recommendation was to administer the ILD questionnaire and consider flexible bronchoscopy with transbronchial biopsies. I had discussed this with Eileen Flores during our prior visit and she was hesitant to proceed with procedure given her age and risk of complications.   She lives in a mobile home and has lived there for the past 25 years. She does have old carpet in her bedroom. There is no basement. She is not aware of any water damage or leakage in the house, and has not seen any mold. This was again revisited today. She still has not filled the ILD questionnaire.   She does not have any hot tubs or Jacuzzi. Water source is a well on their property, thou they do not drink from it; they drink bottled water. She does not have any birds, and uses cotton in her bedding; she is not aware of any dawn or feathers in her comforter/pillows. She does not have any family history of lung disease and no personal history of lung disease. She smoked socially in her teen years. She has no occupational exposures and worked as a Haematologist in the past. She reports a history of UTI's for which she is on Mirabegron. She did receive Nitrofurantoin in September of 2023.  Ancillary information including prior  medications, full medical/surgical/family/social histories, and PFTs (when available) are listed below and have been reviewed.   Review of Systems  Constitutional:  Negative for chills, diaphoresis, fever, malaise/fatigue and weight loss.  Respiratory:  Positive for shortness of breath. Negative for cough, hemoptysis and wheezing.   Cardiovascular:  Negative for chest pain and palpitations.  Skin:  Negative for itching and rash.     Objective:   Vitals:   04/26/23 1030  BP: (!) 146/78  Pulse: 68  Temp: 98 F (36.7 C)  TempSrc: Temporal  SpO2: 95%  Weight: 195 lb (88.5 kg)  Height: 5\' 9"  (1.753 m)   95% on RA  BMI Readings from Last 3 Encounters:  04/26/23 28.80 kg/m  04/07/23 28.85 kg/m  03/23/23 29.24 kg/m   Wt Readings from Last 3 Encounters:  04/26/23 195 lb (88.5 kg)  04/07/23 195 lb 6 oz (88.6 kg)  03/23/23 198 lb (89.8 kg)    Physical Exam Constitutional:      General: She is not in acute distress.    Appearance: Normal appearance. She is not ill-appearing.  HENT:     Head: Normocephalic.     Nose: Nose normal.     Mouth/Throat:     Mouth: Mucous membranes are moist.  Eyes:     Pupils: Pupils are  equal, round, and reactive to light.  Cardiovascular:     Rate and Rhythm: Normal rate and regular rhythm.     Pulses: Normal pulses.     Heart sounds: Normal heart sounds.  Pulmonary:     Effort: Pulmonary effort is normal. No respiratory distress.     Breath sounds: Rales (over the basees) present. No wheezing.  Abdominal:     Palpations: Abdomen is soft.  Musculoskeletal:        General: Normal range of motion.  Skin:    General: Skin is warm.  Neurological:     General: No focal deficit present.     Mental Status: She is alert and oriented to person, place, and time. Mental status is at baseline.       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%; PR 90%; MammoSite January 2015, DECLINED ANTI-ESTROGEN TREATMENT   Mammographic microcalcification    Syncope and collapse    Wears hearing aid      Family History  Problem Relation Age of Onset   Cancer Mother 60       liver cancer   Cancer Father 52       liver cancer   Alcohol abuse Father    Heart attack Brother    Colon cancer Other    Colon polyps Other    Breast cancer Neg Hx      Past Surgical History:  Procedure Laterality Date   BREAST BIOPSY Left 2014   stereo biopsy   BREAST EXCISIONAL BIOPSY Right 2014   papilloma   BREAST LUMPECTOMY Left 2014   BREAST SURGERY Left Oct 2012   benign stereo biopsy   BREAST SURGERY Right 01-14-13   excision breast mass, intraductal papilloma   BREAST SURGERY Left 01-14-13   Excision intra-papillary carcinoma, DCIS, ER 90%, PR 90%.   cataract surgery Bilateral 2009   COLONOSCOPY  2011   Dr. Lemar Livings   dermbrasion   1965   OVARIAN CYST REMOVAL  1967   POLYPECTOMY  2006   THYROIDECTOMY  2006   TONGUE SURGERY  2009    Social History   Socioeconomic History   Marital status: Widowed    Spouse name: Not on file   Number of children: 2   Years of education: Not on file   Highest education level: Some college, no degree  Occupational History   Not on file  Tobacco Use   Smoking status: Former    Types: Cigarettes   Smokeless tobacco: Never   Tobacco comments:    quit in mid to late 20's  Vaping Use   Vaping status: Never Used  Substance and Sexual Activity   Alcohol use: No    Comment: rare- 1 drink   Drug use: No   Sexual activity: Not Currently  Other Topics Concern   Not on  file  Social History Narrative   Not on file   Social Drivers of Health   Financial Resource Strain: Low Risk  (  11/07/2022)   Overall Financial Resource Strain (CARDIA)    Difficulty of Paying Living Expenses: Not very hard  Food Insecurity: No Food Insecurity (11/07/2022)   Hunger Vital Sign    Worried About Running Out of Food in the Last Year: Never true    Ran Out of Food in the Last Year: Never true  Transportation Needs: No Transportation Needs (11/07/2022)   PRAPARE - Administrator, Civil Service (Medical): No    Lack of Transportation (Non-Medical): No  Physical Activity: Inactive (11/07/2022)   Exercise Vital Sign    Days of Exercise per Week: 0 days    Minutes of Exercise per Session: 0 min  Stress: No Stress Concern Present (11/07/2022)   [employer] of Occupational Health - Occupational Stress Questionnaire    Feeling of Stress : Only a little  Social Connections: Moderately Isolated (11/07/2022)   Social Connection and Isolation Panel [NHANES]    Frequency of Communication with Friends and Family: Twice a week    Frequency of Social Gatherings with Friends and Family: Once a week    Attends Religious Services: More than 4 times per year    Active Member of [employer] or Organizations: No    Attends Banker Meetings: Never    Marital Status: Widowed  Intimate Partner Violence: Not At Risk (11/07/2022)   Humiliation, Afraid, Rape, and Kick questionnaire    Fear of Current or Ex-Partner: No    Emotionally Abused: No    Physically Abused: No    Sexually Abused: No     Allergies  Allergen Reactions   Penicillin G Rash   Penicillins Rash     CBC    Component Value Date/Time   WBC 14.2 (H) 12/27/2022 1436   RBC 4.88 12/27/2022 1436   HGB 15.1 (H) 12/27/2022 1436   HGB 14.6 03/09/2022 1624   HCT 46.0 12/27/2022 1436   HCT 43.5 03/09/2022 1624   PLT 306 12/27/2022 1436   PLT 339 03/09/2022 1624   MCV 94.3 12/27/2022 1436   MCV 93  03/09/2022 1624   MCV 93 07/07/2011 1555   MCH 30.9 12/27/2022 1436   MCHC 32.8 12/27/2022 1436   RDW 13.1 12/27/2022 1436   RDW 11.7 03/09/2022 1624   RDW 13.6 07/07/2011 1555   LYMPHSABS 2.1 03/09/2022 1624   MONOABS 0.7 11/24/2020 1919   EOSABS 0.2 03/09/2022 1624   BASOSABS 0.1 03/09/2022 1624    Pulmonary Functions Testing Results:    Latest Ref Rng & Units 04/07/2022    2:56 PM  PFT Results  FVC-Pre L 2.30   FVC-Predicted Pre % 76   Pre FEV1/FVC % % 73   FEV1-Pre L 1.68   FEV1-Predicted Pre % 74   DLCO uncorrected ml/min/mmHg 9.31   DLCO UNC% % 43   DLVA Predicted % 70   TLC L 4.31   TLC % Predicted % 75   RV % Predicted % 89     Outpatient Medications Prior to Visit  Medication Sig Dispense Refill   Calcium Carb-Cholecalciferol 600-10 MG-MCG CHEW Chew by mouth.     conjugated estrogens (PREMARIN) vaginal cream Place 1 applicator vaginally. 3 times a week     Cranberry-Vitamin C-Probiotic (AZO CRANBERRY PO) Take by mouth as needed.     levothyroxine (SYNTHROID) 112 MCG tablet Take 112 mcg by mouth daily.     lisinopril (ZESTRIL) 20 MG tablet Take 1 tablet (20 mg total) by mouth daily.     mirabegron ER (  MYRBETRIQ) 50 MG TB24 tablet Take 1 tablet (50 mg total) by mouth daily. 30 tablet 11   timolol (TIMOPTIC) 0.5 % ophthalmic solution Place 1 drop into both eyes 2 (two) times daily.     No facility-administered medications prior to visit.

## 2023-05-08 ENCOUNTER — Telehealth: Payer: Self-pay | Admitting: Family Medicine

## 2023-05-08 DIAGNOSIS — I10 Essential (primary) hypertension: Secondary | ICD-10-CM

## 2023-05-08 NOTE — Telephone Encounter (Signed)
 [facility] is requesting refill lisinopril (ZESTRIL) 20 MG tablet   Please advise

## 2023-05-10 ENCOUNTER — Telehealth: Payer: Self-pay | Admitting: Family Medicine

## 2023-05-10 DIAGNOSIS — I10 Essential (primary) hypertension: Secondary | ICD-10-CM

## 2023-05-10 NOTE — Telephone Encounter (Signed)
 [facility] is requesting refill lisinopril (ZESTRIL) 20 MG tablet   Please advise

## 2023-05-19 ENCOUNTER — Emergency Department
Admission: EM | Admit: 2023-05-19 | Discharge: 2023-05-19 | Disposition: A | Discharge status: Home / Self Care | Attending: Emergency Medicine | Admitting: Emergency Medicine

## 2023-05-19 ENCOUNTER — Emergency Department

## 2023-05-19 DIAGNOSIS — R0981 Nasal congestion: Secondary | ICD-10-CM | POA: Diagnosis not present

## 2023-05-19 DIAGNOSIS — R109 Unspecified abdominal pain: Secondary | ICD-10-CM | POA: Diagnosis not present

## 2023-05-19 DIAGNOSIS — R197 Diarrhea, unspecified: Secondary | ICD-10-CM | POA: Diagnosis not present

## 2023-05-19 DIAGNOSIS — W19XXXA Unspecified fall, initial encounter: Secondary | ICD-10-CM | POA: Insufficient documentation

## 2023-05-19 DIAGNOSIS — K5730 Diverticulosis of large intestine without perforation or abscess without bleeding: Secondary | ICD-10-CM | POA: Diagnosis not present

## 2023-05-19 DIAGNOSIS — R1084 Generalized abdominal pain: Secondary | ICD-10-CM | POA: Insufficient documentation

## 2023-05-19 DIAGNOSIS — K449 Diaphragmatic hernia without obstruction or gangrene: Secondary | ICD-10-CM | POA: Diagnosis not present

## 2023-05-19 DIAGNOSIS — R55 Syncope and collapse: Secondary | ICD-10-CM | POA: Diagnosis not present

## 2023-05-19 DIAGNOSIS — I959 Hypotension, unspecified: Secondary | ICD-10-CM | POA: Diagnosis not present

## 2023-05-19 DIAGNOSIS — R112 Nausea with vomiting, unspecified: Secondary | ICD-10-CM | POA: Insufficient documentation

## 2023-05-19 DIAGNOSIS — R0689 Other abnormalities of breathing: Secondary | ICD-10-CM | POA: Diagnosis not present

## 2023-05-19 DIAGNOSIS — I447 Left bundle-branch block, unspecified: Secondary | ICD-10-CM | POA: Diagnosis not present

## 2023-05-19 LAB — CBC WITH DIFFERENTIAL/PLATELET
Abs Immature Granulocytes: 0.08 10*3/uL — ABNORMAL HIGH (ref 0.00–0.07)
Basophils Absolute: 0.1 10*3/uL (ref 0.0–0.1)
Basophils Relative: 1 %
Eosinophils Absolute: 0.2 10*3/uL (ref 0.0–0.5)
Eosinophils Relative: 1 %
HCT: 49 % — ABNORMAL HIGH (ref 36.0–46.0)
Hemoglobin: 15.7 g/dL — ABNORMAL HIGH (ref 12.0–15.0)
Immature Granulocytes: 1 %
Lymphocytes Relative: 11 %
Lymphs Abs: 1.9 10*3/uL (ref 0.7–4.0)
MCH: 31.3 pg (ref 26.0–34.0)
MCHC: 32 g/dL (ref 30.0–36.0)
MCV: 97.6 fL (ref 80.0–100.0)
Monocytes Absolute: 1.4 10*3/uL — ABNORMAL HIGH (ref 0.1–1.0)
Monocytes Relative: 8 %
Neutro Abs: 12.8 10*3/uL — ABNORMAL HIGH (ref 1.7–7.7)
Neutrophils Relative %: 78 %
Platelets: 300 10*3/uL (ref 150–400)
RBC: 5.02 MIL/uL (ref 3.87–5.11)
RDW: 12.7 % (ref 11.5–15.5)
WBC: 16.3 10*3/uL — ABNORMAL HIGH (ref 4.0–10.5)
nRBC: 0 % (ref 0.0–0.2)

## 2023-05-19 LAB — RESP PANEL BY RT-PCR (RSV, FLU A&B, COVID)  RVPGX2
Influenza A by PCR: NEGATIVE
Influenza B by PCR: NEGATIVE
Resp Syncytial Virus by PCR: NEGATIVE
SARS Coronavirus 2 by RT PCR: NEGATIVE

## 2023-05-19 LAB — TROPONIN I (HIGH SENSITIVITY)
Troponin I (High Sensitivity): 8 ng/L (ref <18)
Troponin I (High Sensitivity): 8 ng/L (ref <18)

## 2023-05-19 LAB — COMPREHENSIVE METABOLIC PANEL WITH GFR
ALT: 15 U/L (ref 0–44)
AST: 24 U/L (ref 15–41)
Albumin: 3.5 g/dL (ref 3.5–5.0)
Alkaline Phosphatase: 75 U/L (ref 38–126)
Anion gap: 11 (ref 5–15)
BUN: 28 mg/dL — ABNORMAL HIGH (ref 8–23)
CO2: 23 mmol/L (ref 22–32)
Calcium: 9 mg/dL (ref 8.9–10.3)
Chloride: 104 mmol/L (ref 98–111)
Creatinine, Ser: 0.93 mg/dL (ref 0.44–1.00)
GFR, Estimated: >60 mL/min (ref >60)
Glucose, Bld: 111 mg/dL — ABNORMAL HIGH (ref 70–99)
Potassium: 4.5 mmol/L (ref 3.5–5.1)
Sodium: 138 mmol/L (ref 135–145)
Total Bilirubin: 0.5 mg/dL (ref 0.0–1.2)
Total Protein: 7.3 g/dL (ref 6.5–8.1)

## 2023-05-19 LAB — LIPASE, BLOOD: Lipase: 31 U/L (ref 11–51)

## 2023-05-19 MED ORDER — IOHEXOL 300 MG/ML  SOLN
100.0000 mL | Freq: Once | INTRAMUSCULAR | Status: AC | PRN
  Administered 2023-05-19: 100 mL via INTRAVENOUS

## 2023-05-19 MED ORDER — ONDANSETRON HCL 4 MG PO TABS: 4.0000 mg | ORAL_TABLET | Freq: Three times a day (TID) | ORAL | 0 refills | Status: AC | PRN

## 2023-05-19 MED ORDER — ONDANSETRON HCL 4 MG/2ML IJ SOLN
4.0000 mg | Freq: Once | INTRAMUSCULAR | Status: AC
  Administered 2023-05-19: 4 mg via INTRAVENOUS
  Filled 2023-05-19: qty 2

## 2023-05-19 MED ORDER — MORPHINE SULFATE (PF) 4 MG/ML IV SOLN
4.0000 mg | Freq: Once | INTRAVENOUS | Status: AC
  Administered 2023-05-19: 4 mg via INTRAVENOUS
  Filled 2023-05-19: qty 1

## 2023-05-19 MED ORDER — SODIUM CHLORIDE 0.9 % IV BOLUS
1000.0000 mL | Freq: Once | INTRAVENOUS | Status: AC
  Administered 2023-05-19: 1000 mL via INTRAVENOUS

## 2023-05-19 NOTE — ED Triage Notes (Signed)
 Pt arrived via EMS from home. Pt complains of NVD and had a near syncopal episode while trying to have a bowel movement. Pt denies chest pain and SOB. EMS gave 4 baby aspirin. 20G IV EMS

## 2023-05-19 NOTE — Discharge Instructions (Signed)
 I suspect you have a viral GI infection or potential food poisoning as a source of your nausea, vomiting, and diarrhea.  Your lightheaded episode is what we call a near vasovagal syncope which causes your blood pressure to drop and make you feel like you are going to pass out.  Otherwise your heart workup was completely negative today.  No evidence of a heart attack.  I have sent nausea medication to your pharmacy to help with your symptoms over the next day.  Hydration is the biggest key and diarrhea can be expected as well to continue over the next 24 hours.  You can pick up Imodium over-the-counter and take as needed.

## 2023-05-19 NOTE — Consult Note (Signed)
 Cardiology Consultation   Patient ID: Eileen Flores MRN: [id number]; DOB: 11-14-1935  Admit date: 05/19/2023 Date of Consult: 05/19/2023  PCP:  Jacky Kindle, FNP   [facility] Providers Cardiologist:  Debbe Odea, MD        Patient Profile:   Eileen Flores is a 88 y.o. female with a hx of PSVT, interstitial lung disease, and hypothyroidism who is being seen 05/19/2023 for the evaluation of abnormal EKG at the request of Dr. Anner Crete.  History of Present Illness:   Eileen Flores reports having felt constipated.  She sat down on the toilet to have a bowel movement and then became nauseated.  She reports developing diarrhea and emesis with subsequent lightheadedness and near syncope.  The episode lasted close to an hour.  She summoned EMS because she felt generally unwell.  When they arrived, they found her to be hypotensive with a blood pressure of 77/41.  EKG showed normal sinus rhythm with left bundle branch block.  STEMI team was activated by EMS.  On arrival, Eileen Flores reports that she continues to feel a little bit weak and generally unwell.  She denies chest pain.  She has chronic shortness of breath on account of her interstitial lung disease followed by Dr. Aundria Rud.  She has seen Dr. Azucena Cecil the past due to syncope with previous echo and event monitor being unrevealing.  Her chronic dyspnea has been attributed to her interstitial lung disease.   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   cataract surgery Bilateral 2009   COLONOSCOPY  2011   Dr. Lemar Livings   dermbrasion   1965   OVARIAN CYST REMOVAL  1967   POLYPECTOMY  2006   THYROIDECTOMY  2006   TONGUE SURGERY  2009     Inpatient Medications: Scheduled Meds:  Continuous Infusions:  PRN Meds:   Allergies:    Allergies  Allergen Reactions   Penicillin G Rash   Penicillins Rash    Social History:   Social History   Tobacco Use   Smoking status: Former    Types: Cigarettes   Smokeless tobacco: Never   Tobacco comments:    quit in mid to late 20's  Vaping Use   Vaping status: Never Used  Substance Use Topics   Alcohol use: No    Comment: rare- 1 drink   Drug use: No  Family History:   Family History  Problem Relation Age of Onset   Cancer Mother 73       liver cancer   Cancer Father 37       liver cancer   Alcohol abuse Father    Heart attack Brother    Colon cancer Other    Colon polyps Other    Breast cancer Neg Hx      ROS:  Please see the history of present illness. All other ROS reviewed and negative.     Physical Exam/Data:  There were no vitals filed for this visit. No intake or output data in the 24 hours ending 05/19/23 2015    04/26/2023   10:30 AM 04/07/2023    2:18 PM 03/23/2023   11:33 AM  Last 3 Weights  Weight (lbs) 195 lb 195 lb 6 oz 198 lb  Weight (kg) 88.451 kg 88.622 kg 89.812 kg     There is no height or weight on file to calculate BMI.  General: Elderly woman lying on stretcher  in the emergency department.  She appears anxious and mildly uncomfortable. HEENT: normal Neck: no JVD Vascular: No carotid bruits; Distal pulses 2+ bilaterally Cardiac: Distant heart sounds.  Regular rate and rhythm without murmurs. Lungs: Mildly diminished breath sounds throughout without wheezes or crackles. Abd: soft, nontender, no hepatomegaly  Ext: no edema Musculoskeletal:  No deformities, BUE and BLE strength normal and equal Skin: warm and dry  Neuro:  CNs 2-12 intact, no focal abnormalities noted Psych:  Normal affect   EKG:  The EKG was personally reviewed and demonstrates: Normal sinus rhythm with left bundle branch block.  No significant change from prior tracing on 04/07/2023. Telemetry:  Telemetry was personally reviewed and demonstrates:  Sinus rhythm  Relevant CV Studies: TTE (06/28/2022): Normal LV size and wall thickness.  LVEF 55-6% with normal wall motion and grade 1 diastolic dysfunction.  Normal RV size and function.  Normal biatrial size.  No pericardial effusion.  Mild mitral annular calcification without regurgitation or stenosis.  Mild tricuspid regurgitation.  Trivial aortic insufficiency.  Normal CVP.  Event monitor (06/21/2022): Predominantly sinus rhythm with 32 supraventricular runs lasting up to 19 beats with a maximal rate of 179 bpm.  Rare PACs and PVCs noted.  Laboratory Data:  High Sensitivity Troponin:  No results for input(s): "TROPONINIHS" in the last 720 hours.   ChemistryNo results for input(s): "NA", "K", "CL", "CO2", "GLUCOSE", "BUN", "CREATININE", "CALCIUM", "MG", "GFRNONAA", "GFRAA", "ANIONGAP" in the last 168 hours.  No results for input(s): "PROT", "ALBUMIN", "AST", "ALT", "ALKPHOS", "BILITOT" in the last 168 hours. Lipids No results for input(s): "CHOL", "TRIG", "HDL", "LABVLDL", "LDLCALC", "CHOLHDL" in the last 168 hours.  HematologyNo results for input(s): "WBC", "RBC", "HGB", "HCT", "MCV", "MCH", "MCHC", "RDW", "PLT" in the last 168  hours. Thyroid No results for input(s): "TSH", "FREET4" in the last 168 hours.  BNPNo results for input(s): "BNP", "PROBNP" in the last 168 hours.  DDimer No results for input(s): "DDIMER" in the last 168 hours.   Radiology/Studies:  No results found.   Assessment and Plan:   Near syncope: Patient presents with near syncope in the setting of GI distress, most suggestive of a vasovagal mechanism.  She was initially hypotensive when EMS arrived, though her first blood pressure reading here is actually a bit high at 152/83.  Previous workup was largely unrevealing with no significant structural heart disease by echo last year and event monitor showing a few episodes of PSVT not  felt to be the cause of her near syncope.  Recommend further workup of her GI illness in the ED.  Left bundle branch block: The patient has a chronic left bundle branch block, redemonstrated on today's EKG.  It is not diagnostic of STEMI, particularly given her lack of angina.  It is reasonable to trend troponins in case her GI distress is an atypical presentation of ACS.  Emergent cardiac catheterization is not indicated at this time.  If troponins remain negative, no further inpatient ischemia evaluation would need to be pursued unless the patient develops typical angina.  For questions or updates, please contact [facility] Please consult [email or web address] for contact info under [facility].  Signed, Yvonne Kendall, MD  05/19/2023 8:15 PM

## 2023-05-19 NOTE — ED Provider Notes (Addendum)
 [facility] Provider Note    Event Date/Time   First MD Initiated Contact with Patient 05/19/23 2017     (approximate)   History   No chief complaint on file.   HPI Eileen Flores is a 88 y.o. female presenting today for near syncope.  Patient was poorly at home where she was on the bathroom having nausea, vomiting, and diarrhea.  While trying to have a bowel movement she had a near syncopal episode and fell to her knees.  Denied hitting her head.  EMS was called.  In the field her EKG was found to show a left bundle branch block.  They called a STEMI alert as they were not able to transfer it to the ED for further evaluation.  Patient denies any chest pain or shortness of breath at any point today or recently.  Currently noting only mild nausea symptoms.  Did not have any specific abdominal pain or dysuria.  No prior history of heart attack.  Mild congestion recently as well.     Physical Exam   Triage Vital Signs: ED Triage Vitals  Encounter Vitals Group     BP 05/19/23 2017 (!) 152/83     Systolic BP Percentile --      Diastolic BP Percentile --      Pulse Rate 05/19/23 2017 68     Resp 05/19/23 2017 (!) 22     Temp 05/19/23 2017 97.9 F (36.6 C)     Temp Source 05/19/23 2017 Oral     SpO2 05/19/23 2017 96 %     Weight 05/19/23 2018 197 lb (89.4 kg)     Height 05/19/23 2018 5\' 9"  (1.753 m)     Head Circumference --      Peak Flow --      Pain Score 05/19/23 2006 0     Pain Loc --      Pain Education --      Exclude from Growth Chart --     Most recent vital signs: Vitals:   05/19/23 2017  BP: (!) 152/83  Pulse: 68  Resp: (!) 22  Temp: 97.9 F (36.6 C)  SpO2: 96%   Physical Exam: I have reviewed the vital signs and nursing notes. General: Awake, alert, no acute distress.  Nontoxic appearing. Head:  Atraumatic, normocephalic.   ENT:  EOM intact, PERRL. Oral mucosa is pink and moist with no lesions. Neck: Neck is supple with full  range of motion, No meningeal signs. Cardiovascular:  RRR, No murmurs. Peripheral pulses palpable and equal bilaterally. Respiratory:  Symmetrical chest wall expansion.  No rhonchi, rales, or wheezes.  Good air movement throughout.  No use of accessory muscles.   Musculoskeletal:  No cyanosis or edema. Moving extremities with full ROM Abdomen:  Soft, mild generalized tenderness throughout, nondistended. Neuro:  GCS 15, moving all four extremities, interacting appropriately. Speech clear. Psych:  Calm, appropriate.   Skin:  Warm, dry, no rash.    ED Results / Procedures / Treatments   Labs (all labs ordered are listed, but only abnormal results are displayed) Labs Reviewed  CBC WITH DIFFERENTIAL/PLATELET - Abnormal; Notable for the following components:      Result Value   WBC 16.3 (*)    Hemoglobin 15.7 (*)    HCT 49.0 (*)    Neutro Abs 12.8 (*)    Monocytes Absolute 1.4 (*)    Abs Immature Granulocytes 0.08 (*)    All other components within normal  limits  COMPREHENSIVE METABOLIC PANEL WITH GFR - Abnormal; Notable for the following components:   Glucose, Bld 111 (*)    BUN 28 (*)    All other components within normal limits  RESP PANEL BY RT-PCR (RSV, FLU A&B, COVID)  RVPGX2  LIPASE, BLOOD  TROPONIN I (HIGH SENSITIVITY)  TROPONIN I (HIGH SENSITIVITY)     EKG My EKG interpretation: Rate of 67, normal sinus rhythm with left bundle branch block.  No acute ST elevations or depressions   RADIOLOGY Independently interpreted CT abdomen/pelvis with no acute intra-abdominal pathology   PROCEDURES:  Critical Care performed: No  Procedures   MEDICATIONS ORDERED IN ED: Medications  sodium chloride 0.9 % bolus 1,000 mL (1,000 mLs Intravenous Bolus from Bag 05/19/23 2030)  ondansetron (ZOFRAN) injection 4 mg (4 mg Intravenous Given 05/19/23 2033)  morphine (PF) 4 MG/ML injection 4 mg (4 mg Intravenous Given 05/19/23 2033)  iohexol (OMNIPAQUE) 300 MG/ML solution 100 mL (100 mLs  Intravenous Contrast Given 05/19/23 2118)     IMPRESSION / MDM / ASSESSMENT AND PLAN / ED COURSE  I reviewed the triage vital signs and the nursing notes.                              Differential diagnosis includes, but is not limited to, ACS, COVID, flu, RSV, viral gastroenteritis, enteritis, colitis, pancreatitis, diverticulitis, appendicitis, SBO, acute cystitis  Patient's presentation is most consistent with acute presentation with potential threat to life or bodily function.  Patient is an 88 year old female presenting today for near syncopal episode associated with nausea, vomiting, and diarrhea.  Her lightheaded episode happened while she was straining in the bathroom most consistent with near vasovagal episode.  Otherwise did not have any chest pain, palpitations, or shortness of breath.  Vital signs are stable on arrival.  Is still appearing nauseous so we will give Zofran and 1 L of fluids.  EKG shows a left bundle branch block and STEMI alert was canceled with cardiology in the room.  Vital signs otherwise stable on arrival.  Symptoms more consistent with likely viral GI infection or acute intra-abdominal process followed by near vasovagal syncope.  Laboratory workup shows slight leukocytosis but no anemia.  CMP unremarkable.  Lipase normal.  Troponin negative x 2 and respiratory panel negative.  CT abdomen/pelvis shows no acute intra-abdominal pathology.  Patient was reassessed and feeling significantly better at this time.  No ongoing nausea or pain symptoms.  Was able to tolerate p.o. and ambulate without any significant difficulty.  She is safe for discharge and does not need any acute additional workup at this time.  Was sent home with Zofran and told to follow-up with PCP.  Given strict return precautions.  The patient is on the cardiac monitor to evaluate for evidence of arrhythmia and/or significant heart rate changes. Clinical Course as of 05/19/23 2252  Fri May 19, 2023  2037  STEMI alert canceled [DW]  2237 Patient reassessed and feeling well at this time.  Will p.o. challenge and ambulate. [DW]  2252 Ambulated with no difficulty. [DW]    Clinical Course User Index [DW] Janith Lima, MD     FINAL CLINICAL IMPRESSION(S) / ED DIAGNOSES   Final diagnoses:  Near syncope  Nausea vomiting and diarrhea     Rx / DC Orders   ED Discharge Orders          Ordered    ondansetron (ZOFRAN) 4 MG  tablet  Every 8 hours PRN        05/19/23 2243             Note:  This document was prepared using Dragon voice recognition software and may include unintentional dictation errors.   Janith Lima, MD 05/19/23 2244    Janith Lima, MD 05/19/23 2252

## 2023-05-19 NOTE — Progress Notes (Signed)
   05/19/23 1900  Spiritual Encounters  Type of Visit Attempt (pt unavailable)  Referral source Code page  Reason for visit Code  OnCall Visit Yes   Chaplain received a page for a code STEMI. When chaplain arrived patient had not arrived yet. Staff will page once patient arrives. Chaplain services remain available for spiritual and emotional support.

## 2023-05-26 ENCOUNTER — Ambulatory Visit: Payer: Self-pay

## 2023-05-26 NOTE — Telephone Encounter (Signed)
 Chief Complaint: constipation Symptoms: constipation Frequency: chronic problem her whole life, worse this week Pertinent Negatives: Patient denies abd pain, bloating, vomiting, rectal bleeding, rectal pain Disposition: [] ED /[x] Urgent Care (no appt availability in office) / [] Appointment(In office/virtual)/ []  [place] [facility]/ [] Home Care/ [] Refused Recommended Disposition /[] [place] [facility]/ []  Follow-up with PCP Additional Notes: Pt reports constipation. Pt states her LBM was 1 week ago. On that day, pt states she nearly passed out on the toilet while straining to have a BM. Note from 4/11 in the ED states pt had a near syncopal event. Now, pt is fearful to strain to have a BM. Pt states she feels the stool is very close to coming out but feels stuck. Pt reports she probably does not drink enough water but states she is trying to drink well today. Pt has hemorrhoids but denies any rectal pain or bleeding. Pt denies nausea, vomiting, abd pain, fever, abd swelling or bloating. Pt has taken 2 doses of Miralax. Pt purchased a saline enema, RN discussed enema w/ pt and gave homecare instructions. RN advised pt she should be seen within 24 hrs for possible fecal impaction and advised she go to an UC. Pt also advised pt if she develops rectal bleeding, rectal pain, weakness, N/V, abd pain, abd bloating, CP, SOB, or near syncope again that she needs to go to the ED. Pt verbalized understanding. RN discussed with pt the closest UC in [place], pt familiar with the location and states she will go within 24 hours.     Copied From CRM [phone]. Reason for Triage: Patient wanting to speak with in regards to an enema at home, if no answer  at [phone], please call [phone].   Reason for Disposition  Last bowel movement (BM) > 4 days ago  Answer Assessment - Initial Assessment Questions 1. STOOL PATTERN OR FREQUENCY: How often do you have a bowel movement (BM)?  (Normal range: 3 times a day  to every 3 days)  When was your last BM?       One week ago, pt states she nearly passed out from straining that evening (she went to the hospital) 2. STRAINING: Do you have to strain to have a BM?      Yes, she has 3. RECTAL PAIN: Does your rectum hurt when the stool comes out? If Yes, ask: Do you have hemorrhoids? How bad is the pain?  (Scale 1-10; or mild, moderate, severe)     Denies pain 4. STOOL COMPOSITION: Are the stools hard?      No, pt states they are not extremely hard, not hard, just big  5. BLOOD ON STOOLS: Has there been any blood on the toilet tissue or on the surface of the BM? If Yes, ask: When was the last time?     no 6. CHRONIC CONSTIPATION: Is this a new problem for you?  If No, ask: How long have you had this problem? (days, weeks, months)      No, all my life, states her mom gave her laxatives 7. CHANGES IN DIET OR HYDRATION: Have there been any recent changes in your diet? How much fluids are you drinking on a daily basis?  How much have you had to drink today?     Pt states she is probably not drinking enough water. I have a hard time with my diet since I am trying to avoid salt. Pt states she has been drinking pretty good today 8. MEDICINES: Have you been  taking any new medicines? Are you taking any narcotic pain medicines? (e.g., Dilaudid, morphine , Percocet, Vicodin)     Pt went to the hospital Friday night. Pt had a vasovagal episode on the commode a week ago while straining to have a BM and EMS came. Medic tried to lift the pt herself and pt crashed into the cabinet, states her foot twisted. Pt was given IV morphine  for foot pain. Pt states foot is better. No other narcotics 9. LAXATIVES: Have you been using any stool softeners, laxatives, or enemas?  If Yes, ask What, how often, and when was the last time?     Miralax - took Miralax twice 10. ACTIVITY:  How much walking do you do every day?  Has your activity level decreased  in the past week?        States her activity may have decreased, states she has a lung disease but is trying to stay active  11. CAUSE: What do you think is causing the constipation?        Not drinking enough 12. OTHER SYMPTOMS: Do you have any other symptoms? (e.g., abdomen pain, bloating, fever, vomiting)       Pt reports using Miralax recently, states she took it today and yesterday. States she has taken it twice. Pt states she had an episode a week ago, states she was on the toilet and straining and almost passed out. Pt states she went to the hospital, her BP dropped. Pt states she is afraid to strain because of that episode. Pt reports there is stool near her bottom but she is afraid to strain. Pt states she does not drink enough water and thinks that's why she has trouble with BM. No abd pain. Denies abd bloating. No vomiting. Endorses that she is passing a little gas. Denies fever. Pt endorses hot spells and cold chills associated with her lung disease, states that is not a new problem 13. MEDICAL HISTORY: Do you have a history of hemorrhoids, rectal fissures, or rectal surgery or rectal abscess?         Hx of hemorrhoids, denies bleeding or pain at this time  Protocols used: Constipation-A-AH

## 2023-05-26 NOTE — Telephone Encounter (Signed)
FYI please see the message below.

## 2023-07-18 DIAGNOSIS — N3941 Urge incontinence: Secondary | ICD-10-CM | POA: Diagnosis not present

## 2023-07-18 DIAGNOSIS — N2889 Other specified disorders of kidney and ureter: Secondary | ICD-10-CM | POA: Diagnosis not present

## 2023-07-18 DIAGNOSIS — J849 Interstitial pulmonary disease, unspecified: Secondary | ICD-10-CM | POA: Diagnosis not present

## 2023-07-18 DIAGNOSIS — N281 Cyst of kidney, acquired: Secondary | ICD-10-CM | POA: Diagnosis not present

## 2023-07-18 DIAGNOSIS — K219 Gastro-esophageal reflux disease without esophagitis: Secondary | ICD-10-CM | POA: Diagnosis not present

## 2023-07-18 DIAGNOSIS — I10 Essential (primary) hypertension: Secondary | ICD-10-CM | POA: Diagnosis not present

## 2023-07-18 DIAGNOSIS — R82998 Other abnormal findings in urine: Secondary | ICD-10-CM | POA: Diagnosis not present

## 2023-08-15 ENCOUNTER — Ambulatory Visit: Payer: Self-pay | Admitting: Physician Assistant

## 2023-08-15 DIAGNOSIS — H903 Sensorineural hearing loss, bilateral: Secondary | ICD-10-CM | POA: Diagnosis not present

## 2023-08-15 DIAGNOSIS — H6123 Impacted cerumen, bilateral: Secondary | ICD-10-CM | POA: Diagnosis not present

## 2023-09-18 DIAGNOSIS — Z961 Presence of intraocular lens: Secondary | ICD-10-CM | POA: Diagnosis not present

## 2023-09-18 DIAGNOSIS — H401131 Primary open-angle glaucoma, bilateral, mild stage: Secondary | ICD-10-CM | POA: Diagnosis not present

## 2023-10-31 ENCOUNTER — Telehealth: Payer: Self-pay | Admitting: Student in an Organized Health Care Education/Training Program

## 2023-10-31 NOTE — Telephone Encounter (Signed)
 LVMTCB to schedule PFT prior to 11/06/2023.

## 2023-11-01 ENCOUNTER — Other Ambulatory Visit: Payer: Self-pay

## 2023-11-01 DIAGNOSIS — R0602 Shortness of breath: Secondary | ICD-10-CM

## 2023-11-02 ENCOUNTER — Ambulatory Visit (INDEPENDENT_AMBULATORY_CARE_PROVIDER_SITE_OTHER): Admitting: Student in an Organized Health Care Education/Training Program

## 2023-11-02 DIAGNOSIS — R0602 Shortness of breath: Secondary | ICD-10-CM | POA: Diagnosis not present

## 2023-11-02 LAB — PULMONARY FUNCTION TEST
DL/VA % pred: 90 %
DL/VA: 3.58 ml/min/mmHg/L
DLCO unc % pred: 56 %
DLCO unc: 12.05 ml/min/mmHg
FEF 25-75 Post: 1.99 L/s
FEF 25-75 Pre: 1.99 L/s
FEF2575-%Change-Post: 0 %
FEF2575-%Pred-Post: 143 %
FEF2575-%Pred-Pre: 142 %
FEV1-%Change-Post: -1 %
FEV1-%Pred-Post: 81 %
FEV1-%Pred-Pre: 83 %
FEV1-Post: 1.82 L
FEV1-Pre: 1.86 L
FEV1FVC-%Change-Post: 0 %
FEV1FVC-%Pred-Pre: 113 %
FEV6-%Change-Post: -1 %
FEV6-%Pred-Post: 78 %
FEV6-%Pred-Pre: 79 %
FEV6-Post: 2.22 L
FEV6-Pre: 2.25 L
FEV6FVC-%Pred-Post: 105 %
FEV6FVC-%Pred-Pre: 105 %
FVC-%Change-Post: -1 %
FVC-%Pred-Post: 74 %
FVC-%Pred-Pre: 75 %
FVC-Post: 2.22 L
FVC-Pre: 2.25 L
Post FEV1/FVC ratio: 82 %
Post FEV6/FVC ratio: 100 %
Pre FEV1/FVC ratio: 83 %
Pre FEV6/FVC Ratio: 100 %
RV % pred: 36 %
RV: 1 L
TLC % pred: 57 %
TLC: 3.35 L

## 2023-11-02 NOTE — Patient Instructions (Signed)
 Full PFT completed today ? ?

## 2023-11-02 NOTE — Progress Notes (Signed)
 Full PFT completed today ? ?

## 2023-11-04 ENCOUNTER — Ambulatory Visit: Payer: Self-pay | Admitting: Student in an Organized Health Care Education/Training Program

## 2023-11-06 ENCOUNTER — Encounter: Payer: Self-pay | Admitting: Student in an Organized Health Care Education/Training Program

## 2023-11-06 ENCOUNTER — Ambulatory Visit: Admitting: Student in an Organized Health Care Education/Training Program

## 2023-11-06 VITALS — BP 138/82 | HR 64 | Temp 97.5°F | Ht 69.0 in | Wt 191.8 lb

## 2023-11-06 DIAGNOSIS — Z87891 Personal history of nicotine dependence: Secondary | ICD-10-CM

## 2023-11-06 DIAGNOSIS — J849 Interstitial pulmonary disease, unspecified: Secondary | ICD-10-CM | POA: Diagnosis not present

## 2023-11-06 DIAGNOSIS — J679 Hypersensitivity pneumonitis due to unspecified organic dust: Secondary | ICD-10-CM

## 2023-11-06 NOTE — Progress Notes (Unsigned)
 Synopsis: Referred in *** by Emilio Kelly DASEN, FNP  Assessment & Plan:   There are no diagnoses linked to this encounter.  PFT's with stable TLC and DLCO. CT was somewhat stable recently. Continues to have trepidations regarding bronchoscopy and the initiation of any form of immune suppression. She would consider it if symptoms progress, however. Stable PFT's today. Will see back in 1 year and re-visit symptoms.  Return in about 1 year (around 11/05/2024).  I spent *** minutes caring for this patient today, including {EM billing:28027}  Belva November, MD [facility] 11/06/2023 3:27 PM    End of visit medications:  No orders of the defined types were placed in this encounter.    Current Outpatient Medications:    Calcium Carb-Cholecalciferol 600-10 MG-MCG CHEW, Chew by mouth., Disp: , Rfl:    conjugated estrogens  (PREMARIN ) vaginal cream, Place 1 applicator vaginally. 3 times a week, Disp: , Rfl:    Cranberry-Vitamin C-Probiotic (AZO CRANBERRY PO), Take by mouth as needed., Disp: , Rfl:    levothyroxine  (SYNTHROID ) 112 MCG tablet, Take 112 mcg by mouth daily., Disp: , Rfl:    lisinopril  (ZESTRIL ) 20 MG tablet, Take 1 tablet (20 mg total) by mouth daily., Disp: , Rfl:    mirabegron  ER (MYRBETRIQ ) 50 MG TB24 tablet, Take 1 tablet (50 mg total) by mouth daily., Disp: 30 tablet, Rfl: 11   ondansetron  (ZOFRAN ) 4 MG tablet, Take 1 tablet (4 mg total) by mouth every 8 (eight) hours as needed., Disp: 15 tablet, Rfl: 0   timolol (TIMOPTIC) 0.5 % ophthalmic solution, Place 1 drop into both eyes 2 (two) times daily., Disp: , Rfl:    Subjective:   PATIENT ID: Eileen Flores: female DOB: 08-21-1935, MRN: [id number]  Chief Complaint  Patient presents with   Interstitial Lung Disease    SOB. No wheezing. Occasional dry cough. PFT results.    HPI   Return Visit 11/06/2023:  Continues to report stable symptoms, with mostly exertional dyspnea. No Cough. Had her  repeat PFT's recently and is here to discuss results.  Ancillary information including prior medications, full medical/surgical/family/social histories, and PFTs (when available) are listed below and have been reviewed.   {PULM QUESTIONNAIRES (Optional):33196}  ROS   Objective:   Vitals:   11/06/23 1508  BP: 138/82  Pulse: 64  Temp: (!) 97.5 F (36.4 C)  SpO2: 94%  Weight: 191 lb 12.8 oz (87 kg)  Height: 5' 9 (1.753 m)   94% on *** LPM *** RA BMI Readings from Last 3 Encounters:  11/06/23 28.32 kg/m  11/02/23 28.56 kg/m  05/19/23 29.09 kg/m   Wt Readings from Last 3 Encounters:  11/06/23 191 lb 12.8 oz (87 kg)  11/02/23 193 lb 6.4 oz (87.7 kg)  05/19/23 197 lb (89.4 kg)    Physical Exam    Ancillary Information    Past Medical History:  Diagnosis Date   Allergy    Arthritis 1980   Bowel trouble 1970   Cancer (HCC) 2014   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%; PR 90%; MammoSite January 2015, DECLINED ANTI-ESTROGEN TREATMENT   Mammographic microcalcification  Syncope and collapse    Wears hearing aid      Family History  Problem Relation Age of Onset   Cancer Mother 2       liver cancer   Cancer Father 46       liver cancer   Alcohol abuse Father    Heart attack Brother    Colon cancer Other    Colon polyps Other    Breast cancer Neg Hx      Past Surgical History:  Procedure Laterality Date   BREAST BIOPSY Left 2014   stereo biopsy   BREAST EXCISIONAL BIOPSY Right 2014   papilloma   BREAST LUMPECTOMY Left 2014   BREAST SURGERY Left Oct 2012   benign stereo biopsy   BREAST SURGERY Right 01-14-13   excision breast mass, intraductal  papilloma   BREAST SURGERY Left 01-14-13   Excision intra-papillary carcinoma, DCIS, ER 90%, PR 90%.   cataract surgery Bilateral 2009   COLONOSCOPY  2011   Dr. Dessa   dermbrasion   1965   OVARIAN CYST REMOVAL  1967   POLYPECTOMY  2006   THYROIDECTOMY  2006   TONGUE SURGERY  2009    Social History   Socioeconomic History   Marital status: Widowed    Spouse name: Not on file   Number of children: 2   Years of education: Not on file   Highest education level: Some college, no degree  Occupational History   Not on file  Tobacco Use   Smoking status: Former    Types: Cigarettes   Smokeless tobacco: Never   Tobacco comments:    quit in mid to late 20's  Vaping Use   Vaping status: Never Used  Substance and Sexual Activity   Alcohol use: No    Comment: rare- 1 drink   Drug use: No   Sexual activity: Not Currently  Other Topics Concern   Not on file  Social History Narrative   Not on file   Social Drivers of Health   Financial Resource Strain: Low Risk  (11/07/2022)   Overall Financial Resource Strain (CARDIA)    Difficulty of Paying Living Expenses: Not very hard  Food Insecurity: No Food Insecurity (11/07/2022)   Hunger Vital Sign    Worried About Running Out of Food in the Last Year: Never true    Ran Out of Food in the Last Year: Never true  Transportation Needs: No Transportation Needs (11/07/2022)   PRAPARE - Administrator, Civil Service (Medical): No    Lack of Transportation (Non-Medical): No  Physical Activity: Inactive (11/07/2022)   Exercise Vital Sign    Days of Exercise per Week: 0 days    Minutes of Exercise per Session: 0 min  Stress: No Stress Concern Present (11/07/2022)   [employer] of Occupational Health - Occupational Stress Questionnaire    Feeling of Stress : Only a little  Social Connections: Moderately Isolated (11/07/2022)   Social Connection and Isolation Panel    Frequency of Communication with Friends and Family: Twice  a week    Frequency of Social Gatherings with Friends and Family: Once a week    Attends Religious Services: More than 4 times per year    Active Member of [employer] or Organizations: No    Attends Banker Meetings: Never    Marital Status: Widowed  Intimate Partner Violence: Not At Risk (11/07/2022)   Humiliation, Afraid, Rape, and Kick questionnaire    Fear of Current  or Ex-Partner: No    Emotionally Abused: No    Physically Abused: No    Sexually Abused: No     Allergies  Allergen Reactions   Penicillin G Rash   Penicillins Rash     CBC    Component Value Date/Time   WBC 16.3 (H) 05/19/2023 2018   RBC 5.02 05/19/2023 2018   HGB 15.7 (H) 05/19/2023 2018   HGB 14.6 03/09/2022 1624   HCT 49.0 (H) 05/19/2023 2018   HCT 43.5 03/09/2022 1624   PLT 300 05/19/2023 2018   PLT 339 03/09/2022 1624   MCV 97.6 05/19/2023 2018   MCV 93 03/09/2022 1624   MCV 93 07/07/2011 1555   MCH 31.3 05/19/2023 2018   MCHC 32.0 05/19/2023 2018   RDW 12.7 05/19/2023 2018   RDW 11.7 03/09/2022 1624   RDW 13.6 07/07/2011 1555   LYMPHSABS 1.9 05/19/2023 2018   LYMPHSABS 2.1 03/09/2022 1624   MONOABS 1.4 (H) 05/19/2023 2018   EOSABS 0.2 05/19/2023 2018   EOSABS 0.2 03/09/2022 1624   BASOSABS 0.1 05/19/2023 2018   BASOSABS 0.1 03/09/2022 1624    Pulmonary Functions Testing Results:    Latest Ref Rng & Units 11/02/2023    2:44 PM 04/07/2022    2:56 PM  PFT Results  FVC-Pre L 2.25  2.30   FVC-Predicted Pre % 75  76   FVC-Post L 2.22    FVC-Predicted Post % 74    Pre FEV1/FVC % % 83  73   Post FEV1/FCV % % 82    FEV1-Pre L 1.86  1.68   FEV1-Predicted Pre % 83  74   FEV1-Post L 1.82    DLCO uncorrected ml/min/mmHg 12.05  9.31   DLCO UNC% % 56  43   DLVA Predicted % 90  70   TLC L 3.35  4.31   TLC % Predicted % 57  75   RV % Predicted % 36  89     Outpatient Medications Prior to Visit  Medication Sig Dispense Refill   Calcium Carb-Cholecalciferol 600-10 MG-MCG CHEW Chew by  mouth.     conjugated estrogens  (PREMARIN ) vaginal cream Place 1 applicator vaginally. 3 times a week     Cranberry-Vitamin C-Probiotic (AZO CRANBERRY PO) Take by mouth as needed.     levothyroxine  (SYNTHROID ) 112 MCG tablet Take 112 mcg by mouth daily.     lisinopril  (ZESTRIL ) 20 MG tablet Take 1 tablet (20 mg total) by mouth daily.     mirabegron  ER (MYRBETRIQ ) 50 MG TB24 tablet Take 1 tablet (50 mg total) by mouth daily. 30 tablet 11   ondansetron  (ZOFRAN ) 4 MG tablet Take 1 tablet (4 mg total) by mouth every 8 (eight) hours as needed. 15 tablet 0   timolol (TIMOPTIC) 0.5 % ophthalmic solution Place 1 drop into both eyes 2 (two) times daily.     No facility-administered medications prior to visit.

## 2023-11-08 ENCOUNTER — Ambulatory Visit: Payer: Self-pay | Admitting: Emergency Medicine

## 2023-11-08 VITALS — Ht 69.0 in | Wt 192.0 lb

## 2023-11-08 DIAGNOSIS — Z0000 Encounter for general adult medical examination without abnormal findings: Secondary | ICD-10-CM

## 2023-11-08 NOTE — Progress Notes (Signed)
 Subjective:   Eileen Flores is a 88 y.o. who presents for a Medicare Wellness preventive visit.  As a reminder, Annual Wellness Visits don't include a physical exam, and some assessments may be limited, especially if this visit is performed virtually. We may recommend an in-person follow-up visit with your provider if needed.  Visit Complete: Virtual I connected with  Eileen Flores on 11/08/23 by a audio enabled telemedicine application and verified that I am speaking with the correct person using two identifiers.  Patient Location: Home  Provider Location: Home Office  I discussed the limitations of evaluation and management by telemedicine. The patient expressed understanding and agreed to proceed.  Vital Signs: Because this visit was a virtual/telehealth visit, some criteria may be missing or patient reported. Any vitals not documented were not able to be obtained and vitals that have been documented are patient reported.  VideoDeclined- This patient declined Librarian, academic. Therefore the visit was completed with audio only.  Persons Participating in Visit: Patient.  AWV Questionnaire: No: Patient Medicare AWV questionnaire was not completed prior to this visit.  Cardiac Risk Factors include: advanced age (>31men, >83 women);dyslipidemia;hypertension     Objective:    Today's Vitals   11/08/23 1013  Weight: 192 lb (87.1 kg)  Height: 5' 9 (1.753 m)   Body mass index is 28.35 kg/m.     11/08/2023   10:26 AM 05/19/2023    8:19 PM 12/27/2022    2:35 PM 11/07/2022   10:49 AM 04/15/2022    9:30 PM 12/02/2021    4:07 PM 11/03/2021    1:54 PM  Advanced Directives  Does Patient Have a Medical Advance Directive? No No No No No No No  Would patient like information on creating a medical advance directive? Yes (MAU/Ambulatory/Procedural Areas - Information given)     No - Patient declined No - Patient declined    Current Medications  (verified) Outpatient Encounter Medications as of 11/08/2023  Medication Sig   conjugated estrogens  (PREMARIN ) vaginal cream Place 1 applicator vaginally. 3 times a week   Cranberry-Vitamin C-Probiotic (AZO CRANBERRY PO) Take by mouth as needed.   levothyroxine  (SYNTHROID ) 112 MCG tablet Take 112 mcg by mouth daily.   lisinopril  (ZESTRIL ) 20 MG tablet Take 1 tablet (20 mg total) by mouth daily.   mirabegron  ER (MYRBETRIQ ) 50 MG TB24 tablet Take 1 tablet (50 mg total) by mouth daily.   timolol (TIMOPTIC) 0.5 % ophthalmic solution Place 1 drop into both eyes 2 (two) times daily.   Calcium Carb-Cholecalciferol 600-10 MG-MCG CHEW Chew by mouth. (Patient not taking: Reported on 11/08/2023)   ondansetron  (ZOFRAN ) 4 MG tablet Take 1 tablet (4 mg total) by mouth every 8 (eight) hours as needed. (Patient not taking: Reported on 11/08/2023)   No facility-administered encounter medications on file as of 11/08/2023.    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   cataract surgery Bilateral 2009   COLONOSCOPY  2011   Dr. Dessa   dermbrasion   1965   OVARIAN CYST REMOVAL  1967   POLYPECTOMY  2006   THYROIDECTOMY  2006   TONGUE SURGERY  2009   Family History  Problem Relation Age of Onset   Cancer Mother 21       liver cancer   Cancer Father 62       liver cancer   Alcohol abuse Father    Heart attack Brother    Colon cancer Other    Colon polyps Other    Breast cancer Neg Hx    Social History   Socioeconomic History   Marital status: Widowed    Spouse name: Not on file   Number of children: 2   Years of education: Not on file   Highest education level: Some college, no degree  Occupational History   Not on file  Tobacco Use   Smoking status: Former    Current packs/day: 0.00    Average packs/day: 0.1 packs/day for 8.0 years (0.8 ttl pk-yrs)    Types: Cigarettes    Start date: 70    Quit date: 57    Years since quitting: 63.7   Smokeless tobacco: Never   Tobacco comments:    quit in mid to late 20's, smoked 1-2 cigs/day  Vaping Use   Vaping status: Never Used  Substance and Sexual Activity   Alcohol use: No    Comment: rare- 1 drink   Drug use: No   Sexual activity: Not Currently  Other Topics Concern   Not on file  Social History Narrative   11/08/23 son lives with patient/pbt   Social Drivers of Health   Financial Resource Strain: Low Risk  (11/08/2023)   Overall Financial Resource Strain (CARDIA)    Difficulty of Paying Living Expenses: Not hard at all  Food Insecurity: No Food Insecurity (11/08/2023)   Hunger Vital Sign    Worried About Running Out of Food in the Last Year: Never true    Ran Out of Food in the Last Year: Never true  Transportation Needs: No Transportation Needs (11/08/2023)   PRAPARE - Administrator, Civil Service (Medical): No    Lack of Transportation (Non-Medical): No  Physical Activity: Insufficiently  Active (11/08/2023)   Exercise Vital Sign    Days of Exercise per Week: 3 days    Minutes of Exercise per Session: 20 min  Stress: No Stress Concern Present (11/08/2023)   [employer] of Occupational Health - Occupational Stress Questionnaire    Feeling of Stress: Not at all  Social Connections: Socially Isolated (11/08/2023)   Social Connection and Isolation Panel    Frequency of Communication with Friends and Family: More than three times a week    Frequency of Social Gatherings with Friends and Family: Twice a week    Attends Religious Services: Never    Active Member  of Clubs or Organizations: No    Attends Banker Meetings: Never    Marital Status: Widowed    Tobacco Counseling Counseling given: Not Answered Tobacco comments: quit in mid to late 20's, smoked 1-2 cigs/day    Clinical Intake:  Pre-visit preparation completed: Yes  Pain : No/denies pain     BMI - recorded: 28.35 Nutritional Status: BMI 25 -29 Overweight Nutritional Risks: Nausea/ vomitting/ diarrhea (at something that didn't agree with her) Diabetes: No  Lab Results  Component Value Date   HGBA1C 5.6 10/22/2018     How often do you need to have someone help you when you read instructions, pamphlets, or other written materials from your doctor or pharmacy?: 1 - Never  Interpreter Needed?: No  Information entered by :: Vina Ned, CMA   Activities of Daily Living     11/08/2023   10:17 AM  In your present state of health, do you have any difficulty performing the following activities:  Hearing? 1  Comment wears hearing aids  Vision? 0  Difficulty concentrating or making decisions? 0  Walking or climbing stairs? 1  Comment uses cane or rolling walker prn  Dressing or bathing? 0  Doing errands, shopping? 1  Comment son takes to appointments  Preparing Food and eating ? N  Using the Toilet? N  In the past six months, have you accidently leaked urine? Y  Comment wears pad   Do you have problems with loss of bowel control? N  Managing your Medications? N  Managing your Finances? N  Housekeeping or managing your Housekeeping? N    Patient Care Team: Wellington Curtis LABOR, FNP as PCP - General (Family Medicine) Darliss Rogue, MD as PCP - Cardiology (Cardiology) Mittie Gaskin, MD as Referring Physician (Ophthalmology) Edda Mt, MD (Otolaryngology) Isadora Hose, MD as Consulting Physician (Pulmonary Disease) Dominica Brandy, MD as Consulting Physician (Nephrology) Vaillancourt, Samantha, PA-C as Physician Assistant (Urology)  I have updated your Care Teams any recent Medical Services you may have received from other providers in the past year.     Assessment:   This is a routine wellness examination for Eileen Flores.  Hearing/Vision screen Hearing Screening - Comments:: Wears hearing aids Vision Screening - Comments:: Gets routine eye exams, Dr. Mittie, [facility] [place]   Goals Addressed             This Visit's Progress    Patient Stated       Maintain current health and activity level       Depression Screen     11/08/2023   10:24 AM 11/07/2022   10:46 AM 04/19/2022    1:58 PM 02/10/2022    1:55 PM 12/06/2021   10:58 AM 11/03/2021    1:50 PM 11/02/2020    5:09 PM  PHQ 2/9 Scores  PHQ - 2 Score 0 1 0 0 0 1 0  PHQ- 9 Score 2  2  2 3      Fall Risk     11/08/2023   10:28 AM 11/07/2022   10:40 AM 04/19/2022    1:58 PM 02/10/2022    1:55 PM 12/06/2021   10:58 AM  Fall Risk   Falls in the past year? 0 0 1 0 0  Number falls in past yr: 0 0 0 0 0  Injury with Fall? 0 0 0 0 0  Risk for fall due to : History of fall(s);Impaired balance/gait;Orthopedic patient;Impaired mobility No Fall Risks  No Fall Risks No Fall Risks  Follow  up Falls evaluation completed;Education provided Education provided;Falls prevention discussed   Falls evaluation completed      Data saved with a previous flowsheet row definition    MEDICARE RISK AT HOME:   Medicare Risk at Home Any stairs in or around the home?: Yes If so, are there any without handrails?: Yes Home free of loose throw rugs in walkways, pet beds, electrical cords, etc?: Yes Adequate lighting in your home to reduce risk of falls?: Yes Life alert?: No Use of a cane, walker or w/c?: Yes (uses cane and rolling walker prn) Grab bars in the bathroom?: No Shower chair or bench in shower?: Yes Elevated toilet seat or a handicapped toilet?: No  TIMED UP AND GO:  Was the test performed?  No  Cognitive Function: 6CIT completed        11/08/2023   10:29 AM 11/07/2022   10:58 AM 11/03/2021    1:56 PM 11/02/2020    5:16 PM 06/19/2019    1:39 PM  6CIT Screen  What Year? 0 points 0 points 0 points 0 points 0 points  What month? 0 points 0 points 0 points 0 points 0 points  What time? 0 points 0 points 0 points 0 points 0 points  Count back from 20 0 points 0 points 0 points 0 points 0 points  Months in reverse 0 points 0 points 0 points 0 points 0 points  Repeat phrase 0 points 0 points 0 points 0 points 0 points  Total Score 0 points 0 points 0 points 0 points 0 points    Immunizations Immunization History  Administered Date(s) Administered   Fluad Quad(high Dose 65+) 10/22/2018, 12/21/2020   Fluad Trivalent(High Dose 65+) 01/13/2023   INFLUENZA, HIGH DOSE SEASONAL PF 11/21/2013, 11/05/2016, 11/28/2017   PNEUMOCOCCAL CONJUGATE-20 12/21/2020   Pneumococcal Conjugate-13 07/05/2017   Pneumococcal-Unspecified 01/14/2013    Screening Tests Health Maintenance  Topic Date Due   DTaP/Tdap/Td (1 - Tdap) Never done   Zoster Vaccines- Shingrix (1 of 2) Never done   Influenza Vaccine  05/07/2024 (Originally 09/08/2023)   Medicare Annual Wellness (AWV)  11/07/2024   Pneumococcal Vaccine: 50+ Years  Completed   HPV VACCINES  Aged Out   Meningococcal B Vaccine  Aged Out   DEXA SCAN  Discontinued   COVID-19 Vaccine  Discontinued    Health Maintenance Items Addressed: Vaccines  Due: flu, Tdap, shingles, See Nurse Notes at the end of this note  Additional Screening:  Vision Screening: Recommended annual ophthalmology exams for early detection of glaucoma and other disorders of the eye. Is the patient up to date with their annual eye exam?  Yes  Who is the provider or what is the name of the office in which the patient attends annual eye exams? Dr. Mittie @ [facility]  Dental Screening: Recommended annual dental exams for proper oral hygiene  Community Resource Referral / Chronic Care Management: CRR required this visit?  No   CCM required this visit?  No   Plan:    I have personally reviewed and noted the following in the patient's chart:   Medical and social history Use of alcohol, tobacco or illicit drugs  Current medications and supplements including opioid prescriptions. Patient is not currently taking opioid prescriptions. Functional ability and status Nutritional status Physical activity Advanced directives List of other physicians Hospitalizations, surgeries, and ER visits in previous 12 months Vitals Screenings to include cognitive, depression, and falls Referrals and appointments  In addition, I have reviewed  and discussed with patient certain preventive protocols, quality metrics, and best practice recommendations. A written personalized care plan for preventive services as well as general preventive health recommendations were provided to patient.   Vina Ned, CMA   11/08/2023   After Visit Summary: (Mail) Due to this being a telephonic visit, the after visit summary with patients personalized plan was offered to patient via mail   Notes:  Needs flu, Tdap and shingles vaccines Declined covid vaccine Schedule OV for 12/11/23 (last seen 01/13/23) Screening colonoscopy, MMG and DEXA scan no longer recommended due to age

## 2023-11-08 NOTE — Patient Instructions (Signed)
 Eileen Flores,  Thank you for taking the time for your Medicare Wellness Visit. I appreciate your continued commitment to your health goals. Please review the care plan we discussed, and feel free to reach out if I can assist you further.  Medicare recommends these wellness visits once per year to help you and your care team stay ahead of potential health issues. These visits are designed to focus on prevention, allowing your provider to concentrate on managing your acute and chronic conditions during your regular appointments.  Please note that Annual Wellness Visits do not include a physical exam. Some assessments may be limited, especially if the visit was conducted virtually. If needed, we may recommend a separate in-person follow-up with your provider.  Ongoing Care Seeing your primary care provider every 3 to 6 months helps us  monitor your health and provide consistent, personalized care. I have scheduled you an appointment with Curtis Boom, NP for 12/11/23 @ 2:20pm  Referrals If a referral was made during today's visit and you haven't received any updates within two weeks, please contact the referred provider directly to check on the status.  Recommended Screenings: Get the flu, tetanus and shingles vaccines at your convenience.  Health Maintenance  Topic Date Due   DTaP/Tdap/Td vaccine (1 - Tdap) Never done   Zoster (Shingles) Vaccine (1 of 2) Never done   Flu Shot  05/07/2024*   Medicare Annual Wellness Visit  11/07/2024   Pneumococcal Vaccine for age over 55  Completed   HPV Vaccine  Aged Out   Meningitis B Vaccine  Aged Out   DEXA scan (bone density measurement)  Discontinued   COVID-19 Vaccine  Discontinued  *Topic was postponed. The date shown is not the original due date.       11/08/2023   10:26 AM  Advanced Directives  Does Patient Have a Medical Advance Directive? No  Would patient like information on creating a medical advance directive? Yes  (MAU/Ambulatory/Procedural Areas - Information given)   Advance Care Planning is important because it: Ensures you receive medical care that aligns with your values, goals, and preferences. Provides guidance to your family and loved ones, reducing the emotional burden of decision-making during critical moments.  Vision: Annual vision screenings are recommended for early detection of glaucoma, cataracts, and diabetic retinopathy. These exams can also reveal signs of chronic conditions such as diabetes and high blood pressure.  Dental: Annual dental screenings help detect early signs of oral cancer, gum disease, and other conditions linked to overall health, including heart disease and diabetes.  Please see the attached documents for additional preventive care recommendations.   Fall Prevention in the Home, Adult Falls can cause injuries and affect people of all ages. There are many simple things that you can do to make your home safe and to help prevent falls. If you need it, ask for help making these changes. What actions can I take to prevent falls? General information Use good lighting in all rooms. Make sure to: Replace any light bulbs that burn out. Turn on lights if it is dark and use night-lights. Keep items that you use often in easy-to-reach places. Lower the shelves around your home if needed. Move furniture so that there are clear paths around it. Do not keep throw rugs or other things on the floor that can make you trip. If any of your floors are uneven, fix them. Add color or contrast paint or tape to clearly mark and help you see: Grab bars or handrails.  First and last steps of staircases. Where the edge of each step is. If you use a ladder or stepladder: Make sure that it is fully opened. Do not climb a closed ladder. Make sure the sides of the ladder are locked in place. Have someone hold the ladder while you use it. Know where your pets are as you move through your  home. What can I do in the bathroom?     Keep the floor dry. Clean up any water that is on the floor right away. Remove soap buildup in the bathtub or shower. Buildup makes bathtubs and showers slippery. Use non-skid mats or decals on the floor of the bathtub or shower. Attach bath mats securely with double-sided, non-slip rug tape. If you need to sit down while you are in the shower, use a non-slip stool. Install grab bars by the toilet and in the bathtub and shower. Do not use towel bars as grab bars. What can I do in the bedroom? Make sure that you have a light by your bed that is easy to reach. Do not use any sheets or blankets on your bed that hang to the floor. Have a firm bench or chair with side arms that you can use for support when you get dressed. What can I do in the kitchen? Clean up any spills right away. If you need to reach something above you, use a sturdy step stool that has a grab bar. Keep electrical cables out of the way. Do not use floor polish or wax that makes floors slippery. What can I do with my stairs? Do not leave anything on the stairs. Make sure that you have a light switch at the top and the bottom of the stairs. Have them installed if you do not have them. Make sure that there are handrails on both sides of the stairs. Fix handrails that are broken or loose. Make sure that handrails are as long as the staircases. Install non-slip stair treads on all stairs in your home if they do not have carpet. Avoid having throw rugs at the top or bottom of stairs, or secure the rugs with carpet tape to prevent them from moving. Choose a carpet design that does not hide the edge of steps on the stairs. Make sure that carpet is firmly attached to the stairs. Fix any carpet that is loose or worn. What can I do on the outside of my home? Use bright outdoor lighting. Repair the edges of walkways and driveways and fix any cracks. Clear paths of anything that can make you  trip, such as tools or rocks. Add color or contrast paint or tape to clearly mark and help you see high doorway thresholds. Trim any bushes or trees on the main path into your home. Check that handrails are securely fastened and in good repair. Both sides of all steps should have handrails. Install guardrails along the edges of any raised decks or porches. Have leaves, snow, and ice cleared regularly. Use sand, salt, or ice melt on walkways during winter months if you live where there is ice and snow. In the garage, clean up any spills right away, including grease or oil spills. What other actions can I take? Review your medicines with your health care provider. Some medicines can make you confused or feel dizzy. This can increase your chance of falling. Wear closed-toe shoes that fit well and support your feet. Wear shoes that have rubber soles and low heels. Use a cane,  walker, scooter, or crutches that help you move around if needed. Talk with your provider about other ways that you can decrease your risk of falls. This may include seeing a physical therapist to learn to do exercises to improve movement and strength. Where to find more information Centers for Disease Control and Prevention, STEADI: TonerPromos.no [employer] on Aging: BaseRingTones.pl National Institute on Aging: BaseRingTones.pl Contact a health care provider if: You are afraid of falling at home. You feel weak, drowsy, or dizzy at home. You fall at home. Get help right away if you: Lose consciousness or have trouble moving after a fall. Have a fall that causes a head injury. These symptoms may be an emergency. Get help right away. Call 911. Do not wait to see if the symptoms will go away. Do not drive yourself to the hospital. This information is not intended to replace advice given to you by your health care provider. Make sure you discuss any questions you have with your health care provider. Document Revised: 09/27/2021  Document Reviewed: 09/27/2021 Elsevier Patient Education  2024 [employer].

## 2023-11-14 ENCOUNTER — Other Ambulatory Visit: Payer: Self-pay | Admitting: Family Medicine

## 2023-11-14 DIAGNOSIS — E890 Postprocedural hypothyroidism: Secondary | ICD-10-CM | POA: Diagnosis not present

## 2023-11-14 DIAGNOSIS — I10 Essential (primary) hypertension: Secondary | ICD-10-CM

## 2023-11-14 NOTE — Telephone Encounter (Unsigned)
 Copied from CRM #[id number]. Topic: Clinical - Medication Refill >> Nov 14, 2023 12:33 PM Santiya F wrote: Medication: lisinopril  (ZESTRIL ) 20 MG tablet [[id number]]  Has the patient contacted their pharmacy? Yes  (Agent: If yes, when and what did the pharmacy advise?) Contact office   This is the patient's preferred pharmacy:  [id number] [place],  - [address] [place] [address] Phone: [phone] Fax: [phone]  Is this the correct pharmacy for this prescription? Yes If no, delete pharmacy and type the correct one.   Has the prescription been filled recently? Yes  Is the patient out of the medication? No  Has the patient been seen for an appointment in the last year OR does the patient have an upcoming appointment? Yes  Can we respond through MyChart? No  Agent: Please be advised that Rx refills may take up to 3 business days. We ask that you follow-up with your pharmacy.

## 2023-11-16 MED ORDER — LISINOPRIL 20 MG PO TABS: 20.0000 mg | ORAL_TABLET | Freq: Every day | ORAL | 0 refills | Status: DC

## 2023-11-16 NOTE — Telephone Encounter (Signed)
 Requested medication (s) are due for refill today: Yes  Requested medication (s) are on the active medication list: Yes  Last refill:  04/07/23  Future visit scheduled: Yes  Notes to clinic:  Last filled by Dr. Darliss.    Requested Prescriptions  Pending Prescriptions Disp Refills   lisinopril  (ZESTRIL ) 20 MG tablet      Sig: Take 1 tablet (20 mg total) by mouth daily.     Cardiovascular:  ACE Inhibitors Failed - 11/16/2023 10:54 AM      Failed - Cr in normal range and within 180 days    Creatinine  Date Value Ref Range Status  07/07/2011 0.95 0.60 - 1.30 mg/dL Final   Creatinine, Ser  Date Value Ref Range Status  05/19/2023 0.93 0.44 - 1.00 mg/dL Final         Failed - K in normal range and within 180 days    Potassium  Date Value Ref Range Status  05/19/2023 4.5 3.5 - 5.1 mmol/L Final  07/07/2011 3.7 3.5 - 5.1 mmol/L Final         Passed - Patient is not pregnant      Passed - Last BP in normal range    BP Readings from Last 1 Encounters:  11/06/23 138/82         Passed - Valid encounter within last 6 months    Recent Outpatient Visits   None     Future Appointments             In 4 months Eileen Flores [facility] [place]

## 2023-11-21 DIAGNOSIS — E890 Postprocedural hypothyroidism: Secondary | ICD-10-CM | POA: Diagnosis not present

## 2023-11-29 ENCOUNTER — Telehealth: Payer: Self-pay | Admitting: Family Medicine

## 2023-11-29 NOTE — Telephone Encounter (Signed)
[facility] fax[facility] ref[facility] request for the following medications:    lisinopril (ZESTRIL) 20 MG tablet   Please advise.

## 2023-11-30 ENCOUNTER — Other Ambulatory Visit: Payer: Self-pay

## 2023-11-30 DIAGNOSIS — I10 Essential (primary) hypertension: Secondary | ICD-10-CM

## 2023-11-30 MED ORDER — LISINOPRIL 20 MG PO TABS: 20.0000 mg | ORAL_TABLET | Freq: Every day | ORAL | 0 refills | Status: DC

## 2023-11-30 NOTE — Telephone Encounter (Signed)
 Converted into refill request. Patient has an upcoming appointment 12/11/2023.

## 2023-12-11 ENCOUNTER — Encounter: Payer: Self-pay | Admitting: Family Medicine

## 2023-12-11 ENCOUNTER — Ambulatory Visit (INDEPENDENT_AMBULATORY_CARE_PROVIDER_SITE_OTHER): Admitting: Family Medicine

## 2023-12-11 VITALS — BP 144/72 | HR 65 | Temp 96.2°F | Wt 195.0 lb

## 2023-12-11 DIAGNOSIS — M7989 Other specified soft tissue disorders: Secondary | ICD-10-CM

## 2023-12-11 DIAGNOSIS — E039 Hypothyroidism, unspecified: Secondary | ICD-10-CM | POA: Diagnosis not present

## 2023-12-11 DIAGNOSIS — Z23 Encounter for immunization: Secondary | ICD-10-CM

## 2023-12-11 DIAGNOSIS — N182 Chronic kidney disease, stage 2 (mild): Secondary | ICD-10-CM | POA: Diagnosis not present

## 2023-12-11 DIAGNOSIS — Z87898 Personal history of other specified conditions: Secondary | ICD-10-CM

## 2023-12-11 DIAGNOSIS — I10 Essential (primary) hypertension: Secondary | ICD-10-CM

## 2023-12-11 DIAGNOSIS — K5909 Other constipation: Secondary | ICD-10-CM | POA: Diagnosis not present

## 2023-12-11 NOTE — Progress Notes (Signed)
 Established Patient Office Visit  Introduced to nurse practitioner role and practice setting.  All questions answered.  Discussed provider/patient relationship and expectations.   Subjective   Patient ID: Eileen Flores, female    DOB: 12/14/1935  Age: 88 y.o. MRN: [id number]  Chief Complaint  Patient presents with   Medical Management of Chronic Issues   Discussed the use of AI scribe software for clinical note transcription with the patient, who gave verbal consent to proceed.  History of Present Illness Eileen Flores is an 88 year old female with hypertension and hypothyroidism who presents for general chronic disease.  She has a history of emergency room visits due to near syncope in April 2025, with a significant episode when her blood pressure dropped to 77 mmHg. At that time, she states she was taking 40 mg of lisinopril  daily, which was later reduced to 20 mg once daily due to hypotension. She has not experienced any similar episodes since April. She has not been checking her blood pressure at home recently due to dead batteries in her monitor. No recent episodes of near syncope since April.  She has a history of constipation, which she manages by increasing her water intake and dietary fiber. She occasionally uses Miralax but prefers to avoid laxatives. She reports missing bowel movements for a couple of days at times, but the issue is less bothersome than before.  She takes levothyroxine  112 mcg daily for hypothyroidism following thyroid  removal - Dr. Juengal, endocrine manages. Her thyroid  function is monitored regularly, with the last lab work done in October showing some fluctuations. She is scheduled for a follow-up in five months to reassess her thyroid  levels.  She has a history of chronic kidney disease,. She follows up with nephrology every six months, with the next appointment in December. She also has a history of renal cysts and renal scarring.      12/11/2023    2:31  PM 11/08/2023   10:24 AM 11/07/2022   10:46 AM  Depression screen PHQ 2/9  Decreased Interest 0 0 0  Down, Depressed, Hopeless 0 0 1  PHQ - 2 Score 0 0 1  Altered sleeping 1 1   Tired, decreased energy 1 1   Change in appetite 0 0   Feeling bad or failure about yourself  0 0   Trouble concentrating 0 0   Moving slowly or fidgety/restless 0 0   Suicidal thoughts 0 0   PHQ-9 Score 2 2   Difficult doing work/chores  Not difficult at all        12/11/2023    2:32 PM  GAD 7 : Generalized Anxiety Score  Nervous, Anxious, on Edge 0  Control/stop worrying 0  Worry too much - different things 0  Trouble relaxing 0  Restless 0  Easily annoyed or irritable 0  Afraid - awful might happen 0  Total GAD 7 Score 0     ROS  Negative unless indicated in HPI   Objective:     BP (!) 144/72 Comment: Manuel cuff  Pulse 65   Temp (!) 96.2 F (35.7 C)   Wt 195 lb (88.5 kg)   SpO2 100%   BMI 28.80 kg/m  BP Readings from Last 3 Encounters:  12/11/23 (!) 144/72  11/06/23 138/82  05/19/23 (!) 147/66   Wt Readings from Last 3 Encounters:  12/11/23 195 lb (88.5 kg)  11/08/23 192 lb (87.1 kg)  11/06/23 191 lb 12.8 oz (87 kg)  Physical Exam Constitutional:      General: She is not in acute distress.    Appearance: Normal appearance. She is obese. She is not ill-appearing, toxic-appearing or diaphoretic.  HENT:     Head: Normocephalic.     Nose: Nose normal.     Mouth/Throat:     Mouth: Mucous membranes are moist.     Pharynx: Oropharynx is clear.  Eyes:     Extraocular Movements: Extraocular movements intact.     Pupils: Pupils are equal, round, and reactive to light.  Cardiovascular:     Rate and Rhythm: Normal rate and regular rhythm.     Pulses: Normal pulses.     Heart sounds: Normal heart sounds. No murmur heard.    No friction rub. No gallop.  Pulmonary:     Effort: No respiratory distress.     Breath sounds: No stridor. No wheezing, rhonchi or rales.  Chest:      Chest wall: No tenderness.  Musculoskeletal:        General: Swelling present.     Right lower leg: Edema present.     Left lower leg: Edema present.  Skin:    General: Skin is warm and dry.     Capillary Refill: Capillary refill takes less than 2 seconds.  Neurological:     General: No focal deficit present.     Mental Status: She is alert and oriented to person, place, and time. Mental status is at baseline.     Cranial Nerves: No cranial nerve deficit.     Motor: No weakness.     Gait: Gait normal.  Psychiatric:        Mood and Affect: Mood normal.        Behavior: Behavior normal.        Thought Content: Thought content normal.        Judgment: Judgment normal.      No results found for any visits on 12/11/23.    The ASCVD Risk score (Arnett DK, et al., 2019) failed to calculate for the following reasons:   The 2019 ASCVD risk score is only valid for ages 73 to 75    Assessment & Plan:  Primary hypertension  Immunization due -     Flu vaccine HIGH DOSE PF(Fluzone  Trivalent)  Acquired hypothyroidism  Chronic constipation  Stage 2 chronic kidney disease  Leg swelling  History of syncope     Assessment and Plan Assessment & Plan Primary Hypertension - Chronic - Originally 161/69, with manual repeat 144/72 - Pt endorses did not take lisinopril  yesterday, but did take this morning Given age, hx of syncope, and co morbidities okay with <140/80 - recommend replacing batteries in at home cuff to monitor at home weekly - No recent episodes of syncope since April. - Ensure lisinopril  is taken daily. - she is follow by nephrology (every six months) and cardiology (annually) - needs repeat CMP - prefers to get labs at nephrology appointment in one month - has appointment with nephrology 01/17/24 - okay to hold labs until then - recommend follow up in 4-6 months with new PCP  Acquired Hypothyroidism  -status post thyroidectomy - levothyroxine  112 mcg daily.  . - Endocrinology, Dr. Juengal manages - recent TSH a little low and T4 subtle elevation - reports endocrine did not change dose, there is just a close follow up in a few months - Follow up with endocrinologist in March for thyroid  function re-evaluation.  Chronic kidney disease, stage 2 - renal  cysts and renal scarring - Continue follow-up with nephrology as scheduled. - Ensure adequate hydration to support kidney function.  Chronic constipation Managed with increased water intake and dietary fiber. Occasional use of Miralax, but prefers to avoid laxatives. - Continue increased water intake and dietary fiber. - Use Miralax as needed for constipation management.  Chronic lower extremity edema, leg swelling - improves with leg elevation. - heart and lung sounds normal today - wear compression socks - ambulate daily   HX of Syncope - associated with bowel movement and vasovagal to straining too hard - keeps on top of bowel movement - no episode since April 2025 - from a cardiology stand point ECHO 2024 unremarkable   Immunization Due Flu shot administered.      Return in about 6 months (around 06/09/2024).    Curtis DELENA Boom, FNP

## 2024-01-19 ENCOUNTER — Other Ambulatory Visit: Payer: Self-pay | Admitting: Physician Assistant

## 2024-01-19 DIAGNOSIS — N3941 Urge incontinence: Secondary | ICD-10-CM

## 2024-01-22 ENCOUNTER — Other Ambulatory Visit: Payer: Self-pay

## 2024-01-22 DIAGNOSIS — N3941 Urge incontinence: Secondary | ICD-10-CM

## 2024-01-22 MED ORDER — MIRABEGRON ER 50 MG PO TB24: 50.0000 mg | ORAL_TABLET | Freq: Every day | ORAL | 11 refills | Status: AC

## 2024-02-18 ENCOUNTER — Other Ambulatory Visit: Payer: Self-pay

## 2024-02-18 ENCOUNTER — Encounter: Payer: Self-pay | Admitting: Emergency Medicine

## 2024-02-18 DIAGNOSIS — N189 Chronic kidney disease, unspecified: Secondary | ICD-10-CM | POA: Insufficient documentation

## 2024-02-18 DIAGNOSIS — R109 Unspecified abdominal pain: Secondary | ICD-10-CM | POA: Insufficient documentation

## 2024-02-18 DIAGNOSIS — R197 Diarrhea, unspecified: Secondary | ICD-10-CM | POA: Insufficient documentation

## 2024-02-18 DIAGNOSIS — R112 Nausea with vomiting, unspecified: Secondary | ICD-10-CM | POA: Insufficient documentation

## 2024-02-18 DIAGNOSIS — D72829 Elevated white blood cell count, unspecified: Secondary | ICD-10-CM | POA: Insufficient documentation

## 2024-02-18 DIAGNOSIS — I129 Hypertensive chronic kidney disease with stage 1 through stage 4 chronic kidney disease, or unspecified chronic kidney disease: Secondary | ICD-10-CM | POA: Insufficient documentation

## 2024-02-18 DIAGNOSIS — E039 Hypothyroidism, unspecified: Secondary | ICD-10-CM | POA: Insufficient documentation

## 2024-02-18 LAB — RESP PANEL BY RT-PCR (RSV, FLU A&B, COVID)  RVPGX2
Influenza A by PCR: NEGATIVE
Influenza B by PCR: NEGATIVE
Resp Syncytial Virus by PCR: NEGATIVE
SARS Coronavirus 2 by RT PCR: NEGATIVE

## 2024-02-18 LAB — COMPREHENSIVE METABOLIC PANEL WITH GFR
ALT: 14 U/L (ref 0–44)
AST: 26 U/L (ref 15–41)
Albumin: 3.7 g/dL (ref 3.5–5.0)
Alkaline Phosphatase: 92 U/L (ref 38–126)
Anion gap: 10 (ref 5–15)
BUN: 33 mg/dL — ABNORMAL HIGH (ref 8–23)
CO2: 25 mmol/L (ref 22–32)
Calcium: 9.1 mg/dL (ref 8.9–10.3)
Chloride: 103 mmol/L (ref 98–111)
Creatinine, Ser: 1.23 mg/dL — ABNORMAL HIGH (ref 0.44–1.00)
GFR, Estimated: 43 mL/min — ABNORMAL LOW
Glucose, Bld: 120 mg/dL — ABNORMAL HIGH (ref 70–99)
Potassium: 4.5 mmol/L (ref 3.5–5.1)
Sodium: 138 mmol/L (ref 135–145)
Total Bilirubin: 0.5 mg/dL (ref 0.0–1.2)
Total Protein: 7 g/dL (ref 6.5–8.1)

## 2024-02-18 LAB — CBC
HCT: 49.5 % — ABNORMAL HIGH (ref 36.0–46.0)
Hemoglobin: 16 g/dL — ABNORMAL HIGH (ref 12.0–15.0)
MCH: 31 pg (ref 26.0–34.0)
MCHC: 32.3 g/dL (ref 30.0–36.0)
MCV: 95.9 fL (ref 80.0–100.0)
Platelets: 304 K/uL (ref 150–400)
RBC: 5.16 MIL/uL — ABNORMAL HIGH (ref 3.87–5.11)
RDW: 12.6 % (ref 11.5–15.5)
WBC: 17.6 K/uL — ABNORMAL HIGH (ref 4.0–10.5)
nRBC: 0 % (ref 0.0–0.2)

## 2024-02-18 LAB — LIPASE, BLOOD: Lipase: 35 U/L (ref 11–51)

## 2024-02-18 LAB — GROUP A STREP BY PCR: Group A Strep by PCR: NOT DETECTED

## 2024-02-18 NOTE — ED Notes (Signed)
 Pt to ED via EMS from home, pt c/o abd pain n/v/d for the past 40 min. 80/42 initially 18 RAC 8mg  zofran  500cc NS 124/81 following fluids.

## 2024-02-18 NOTE — ED Triage Notes (Signed)
 Pt arrives EMS from home reporting sudden onset of n/v/d after going to bed tonight. Pt was initially hypotensive upon ems arrival. Pt received total of 8mg  of Zofran  and 500 mL NS en route. Pt feeling better. Pt reporting sore throat. Pt denies abd pain just reports nausea.

## 2024-02-19 ENCOUNTER — Emergency Department

## 2024-02-19 ENCOUNTER — Emergency Department
Admission: EM | Admit: 2024-02-19 | Discharge: 2024-02-19 | Disposition: A | Attending: Emergency Medicine | Admitting: Emergency Medicine

## 2024-02-19 DIAGNOSIS — K529 Noninfective gastroenteritis and colitis, unspecified: Secondary | ICD-10-CM

## 2024-02-19 MED ORDER — IOHEXOL 300 MG/ML  SOLN
80.0000 mL | Freq: Once | INTRAMUSCULAR | Status: AC | PRN
  Administered 2024-02-19: 80 mL via INTRAVENOUS

## 2024-02-19 MED ORDER — ONDANSETRON HCL 4 MG/2ML IJ SOLN
4.0000 mg | Freq: Once | INTRAMUSCULAR | Status: AC
  Administered 2024-02-19: 4 mg via INTRAVENOUS
  Filled 2024-02-19: qty 2

## 2024-02-19 MED ORDER — ONDANSETRON 4 MG PO TBDP: 4.0000 mg | ORAL_TABLET | Freq: Three times a day (TID) | ORAL | 0 refills | Status: AC | PRN

## 2024-02-19 MED ORDER — LACTATED RINGERS IV BOLUS
1000.0000 mL | Freq: Once | INTRAVENOUS | Status: AC
  Administered 2024-02-19: 1000 mL via INTRAVENOUS

## 2024-02-19 NOTE — ED Notes (Signed)
 Pt has had two BM since being in waiting. Pt assisted via WC and stand-by assist each time. Pt moved back to lobby after restroom.

## 2024-02-19 NOTE — ED Notes (Signed)
 This tech and [employer], [place], both took pt from triage waiting room to room 53 and got pt out of feces clothing as pt has had frequent stools recently. These techs removed pt underwear and gown and put pt into new gown and brief. Pt clothing was put into a patient belongings bag and sat by bedside. Pt has new warm blankets with call bell by bedside. Pt was placed on monitor and recycled blood pressure. RN made aware this pt was placed in room and changed at this time. Pt has family by bedside.

## 2024-02-19 NOTE — ED Notes (Signed)
 Pt reports she feels light headed and like she is going to have syncopal episode, pts VS taken. Pt found to be hypotensive.

## 2024-02-19 NOTE — ED Provider Notes (Signed)
 "  [facility] Provider Note    Event Date/Time   First MD Initiated Contact with Patient 02/19/24 0004     (approximate)   History   Abdominal Pain, Diarrhea, and Emesis   HPI  Eileen Flores is a 89 y.o. female who presents to the ED for evaluation of Abdominal Pain, Diarrhea, and Emesis   Review PCP visit from November.  History of hypertension, hypothyroidism, CKD, constipation.  Patient presents to the ED for evaluation of emesis and diarrhea.  She was notably triaged as having abdominal pain as a part of her chief complaint but she denies any pain just reports nausea, emesis and diarrhea.  She reports that this is not the first time and indicates this has been a recurrent issue a couple times per year for a while.  This time symptoms have been present for about 1 day.  Cannot recall any recent colonoscopy or seeing GI   Physical Exam   Triage Vital Signs: ED Triage Vitals [02/18/24 2255]  Encounter Vitals Group     BP (!) 126/58     Girls Systolic BP Percentile      Girls Diastolic BP Percentile      Boys Systolic BP Percentile      Boys Diastolic BP Percentile      Pulse Rate 66     Resp 18     Temp 97.6 F (36.4 C)     Temp Source Oral     SpO2 96 %     Weight      Height      Head Circumference      Peak Flow      Pain Score 4     Pain Loc      Pain Education      Exclude from Growth Chart     Most recent vital signs: Vitals:   02/18/24 2255 02/19/24 0002  BP: (!) 126/58 (!) 66/35  Pulse: 66   Resp: 18   Temp: 97.6 F (36.4 C)   SpO2: 96%     General: Awake, no distress.  CV:  Good peripheral perfusion.  Resp:  Normal effort.  Abd:  No distention.  Soft and nontender throughout MSK:  No deformity noted.  Neuro:  No focal deficits appreciated. Other:     ED Results / Procedures / Treatments   Labs (all labs ordered are listed, but only abnormal results are displayed) Labs Reviewed  COMPREHENSIVE METABOLIC  PANEL WITH GFR - Abnormal; Notable for the following components:      Result Value   Glucose, Bld 120 (*)    BUN 33 (*)    Creatinine, Ser 1.23 (*)    GFR, Estimated 43 (*)    All other components within normal limits  CBC - Abnormal; Notable for the following components:   WBC 17.6 (*)    RBC 5.16 (*)    Hemoglobin 16.0 (*)    HCT 49.5 (*)    All other components within normal limits  GROUP A STREP BY PCR  RESP PANEL BY RT-PCR (RSV, FLU A&B, COVID)  [id number]  LIPASE, BLOOD  URINALYSIS, ROUTINE W REFLEX MICROSCOPIC    EKG   RADIOLOGY CT abdomen/pelvis interpreted by me with colitis  Official radiology report(s): No results found.  PROCEDURES and INTERVENTIONS:  Procedures  Medications - No data to display   IMPRESSION / MDM / ASSESSMENT AND PLAN / ED COURSE  I reviewed the triage vital signs and the nursing  notes.  Differential diagnosis includes, but is not limited to, C. difficile, inflammatory bowel disease, viral syndrome, SBO  {Patient presents with symptoms of an acute illness or injury that is potentially life-threatening.  Patient presents to the ED after recurrent episode of emesis and diarrhea.  Looks well with a benign abdominal exam without any tenderness, guarding or peritoneal features.  Leukocytosis is noted.  Slight worsening renal function treated with IV LR and oral fluids.  Normal lipase, negative viral swabs.  CT, as above.  Considering the reassuring nature of her exam, nontoxic nature I believe she is suitable for outpatient management without antibiotics.  Discussed GI follow-up and ED return precautions.      FINAL CLINICAL IMPRESSION(S) / ED DIAGNOSES   Final diagnoses:  None     Rx / DC Orders   ED Discharge Orders     None        Note:  This document was prepared using Dragon voice recognition software and may include unintentional dictation errors.   Claudene Rover, MD 02/19/24 [phone]  "

## 2024-02-19 NOTE — Discharge Instructions (Signed)
 Use Zofran  to help with upset stomach and nausea  Use Tylenol  for pain and fevers.  Up to 1000 mg per dose, up to 4 times per day.  Do not take more than 4000 mg of Tylenol /acetaminophen  within 24 hours.Eileen Flores of fluids  Follow-up with a gastroenterologist in the clinic.  You would likely benefit from a colonoscopy considering the recurrent nature of these episodes that you have described  Return to the ED with any worsening symptoms

## 2024-02-26 ENCOUNTER — Telehealth: Payer: Self-pay | Admitting: Family Medicine

## 2024-02-26 DIAGNOSIS — I10 Essential (primary) hypertension: Secondary | ICD-10-CM

## 2024-02-26 MED ORDER — LISINOPRIL 20 MG PO TABS: 20.0000 mg | ORAL_TABLET | Freq: Every day | ORAL | 1 refills | Status: AC

## 2024-02-26 NOTE — Telephone Encounter (Signed)
[facility] fax[facility] ref[facility] request for the following medications:    lisinopril (ZESTRIL) 20 MG tablet   Please advise.

## 2024-03-22 ENCOUNTER — Ambulatory Visit: Payer: Medicare HMO | Admitting: Physician Assistant

## 2024-06-10 ENCOUNTER — Encounter

## 2024-11-13 ENCOUNTER — Ambulatory Visit
